# Patient Record
Sex: Female | Born: 1985 | Race: White | Hispanic: No | Marital: Single | Smoking: Former smoker
Health system: Southern US, Community
[De-identification: ages and names within clinical notes are randomized; demographics above are authoritative.]

## PROBLEM LIST (undated history)

## (undated) DIAGNOSIS — R87619 Unspecified abnormal cytological findings in specimens from cervix uteri: Secondary | ICD-10-CM

## (undated) DIAGNOSIS — IMO0002 Reserved for concepts with insufficient information to code with codable children: Secondary | ICD-10-CM

## (undated) DIAGNOSIS — Z8619 Personal history of other infectious and parasitic diseases: Secondary | ICD-10-CM

## (undated) HISTORY — DX: Unspecified abnormal cytological findings in specimens from cervix uteri: R87.619

## (undated) HISTORY — DX: Personal history of other infectious and parasitic diseases: Z86.19

## (undated) HISTORY — DX: Reserved for concepts with insufficient information to code with codable children: IMO0002

## (undated) HISTORY — PX: COLPOSCOPY: SHX161

---
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
## 2005-07-15 ENCOUNTER — Other Ambulatory Visit: Admission: RE | Admit: 2005-07-15 | Discharge: 2005-07-15 | Payer: Self-pay | Admitting: Obstetrics and Gynecology

## 2006-03-06 ENCOUNTER — Other Ambulatory Visit: Admission: RE | Admit: 2006-03-06 | Discharge: 2006-03-06 | Payer: Self-pay | Admitting: Obstetrics and Gynecology

## 2011-06-02 ENCOUNTER — Ambulatory Visit (INDEPENDENT_AMBULATORY_CARE_PROVIDER_SITE_OTHER): Payer: 59 | Admitting: Internal Medicine

## 2011-06-02 ENCOUNTER — Encounter: Payer: Self-pay | Admitting: Internal Medicine

## 2011-06-02 VITALS — BP 120/70 | HR 86 | Temp 98.7°F | Resp 16 | Ht 62.0 in | Wt 144.0 lb

## 2011-06-02 DIAGNOSIS — Z0000 Encounter for general adult medical examination without abnormal findings: Secondary | ICD-10-CM

## 2011-06-02 DIAGNOSIS — Z719 Counseling, unspecified: Secondary | ICD-10-CM | POA: Insufficient documentation

## 2011-06-02 NOTE — Patient Instructions (Signed)

## 2011-06-02 NOTE — Assessment & Plan Note (Signed)
Exam done, labs ordered, pt ed material was given 

## 2011-06-02 NOTE — Progress Notes (Signed)
  Subjective:    Patient ID: Priscilla [name], female    DOB: [birth year], 25 y.o.   MRN: [id number]  HPI New to me for a complete physical, she offers no complaints.   Review of Systems  Constitutional: Negative.   HENT: Negative.   Eyes: Negative.   Respiratory: Negative.   Cardiovascular: Negative.   Gastrointestinal: Negative.   Genitourinary: Negative.   Musculoskeletal: Negative.   Skin: Negative.   Neurological: Negative.   Hematological: Negative.   Psychiatric/Behavioral: Negative.        Objective:   Physical Exam  Vitals reviewed. Constitutional: She is oriented to person, place, and time. She appears well-developed and well-nourished. No distress.  HENT:  Head: Normocephalic and atraumatic.  Mouth/Throat: Oropharynx is clear and moist. No oropharyngeal exudate.  Eyes: Conjunctivae and EOM are normal. Pupils are equal, round, and reactive to light. Right eye exhibits no discharge. Left eye exhibits no discharge. No scleral icterus.  Neck: Normal range of motion. Neck supple. No JVD present. No tracheal deviation present. No thyromegaly present.  Cardiovascular: Normal rate, regular rhythm and intact distal pulses.  Exam reveals no gallop and no friction rub.   No murmur heard. Pulmonary/Chest: Effort normal and breath sounds normal. No stridor. No respiratory distress. She has no wheezes. She has no rales. She exhibits no tenderness.  Abdominal: Soft. Bowel sounds are normal. She exhibits no distension and no mass. There is no tenderness. There is no rebound and no guarding.  Musculoskeletal: Normal range of motion. She exhibits no edema and no tenderness.  Lymphadenopathy:    She has no cervical adenopathy.  Neurological: She is oriented to person, place, and time. She displays normal reflexes. No cranial nerve deficit. She exhibits normal muscle tone. Coordination normal.  Skin: Skin is warm and dry. No rash noted. She is not diaphoretic. No erythema. No pallor.    Psychiatric: She has a normal mood and affect. Her behavior is normal. Judgment and thought content normal.          Assessment & Plan:

## 2011-06-06 ENCOUNTER — Other Ambulatory Visit (INDEPENDENT_AMBULATORY_CARE_PROVIDER_SITE_OTHER): Payer: 59

## 2011-06-06 DIAGNOSIS — Z0000 Encounter for general adult medical examination without abnormal findings: Secondary | ICD-10-CM

## 2011-06-06 LAB — COMPREHENSIVE METABOLIC PANEL
ALT: 17 U/L (ref 0–35)
AST: 25 U/L (ref 0–37)
Alkaline Phosphatase: 70 U/L (ref 39–117)
Chloride: 108 mEq/L (ref 96–112)
Creatinine, Ser: 0.7 mg/dL (ref 0.4–1.2)
Total Bilirubin: 0.6 mg/dL (ref 0.3–1.2)

## 2011-06-06 LAB — LIPID PANEL
Cholesterol: 182 mg/dL (ref 0–200)
LDL Cholesterol: 104 mg/dL — ABNORMAL HIGH (ref 0–99)
Triglycerides: 31 mg/dL (ref 0.0–149.0)
VLDL: 6.2 mg/dL (ref 0.0–40.0)

## 2011-06-06 LAB — CBC WITH DIFFERENTIAL/PLATELET
Basophils Absolute: 0 10*3/uL (ref 0.0–0.1)
Eosinophils Absolute: 0.1 10*3/uL (ref 0.0–0.7)
Hemoglobin: 13.6 g/dL (ref 12.0–15.0)
Lymphocytes Relative: 24.2 % (ref 12.0–46.0)
MCHC: 33.6 g/dL (ref 30.0–36.0)
Monocytes Absolute: 0.4 10*3/uL (ref 0.1–1.0)
Neutro Abs: 5 10*3/uL (ref 1.4–7.7)
RDW: 13.2 % (ref 11.5–14.6)

## 2011-06-06 LAB — URINALYSIS, ROUTINE W REFLEX MICROSCOPIC
Ketones, ur: NEGATIVE
Leukocytes, UA: NEGATIVE
Specific Gravity, Urine: 1.015 (ref 1.000–1.030)
Urobilinogen, UA: 0.2 (ref 0.0–1.0)

## 2011-06-07 ENCOUNTER — Encounter: Payer: Self-pay | Admitting: Internal Medicine

## 2011-06-08 ENCOUNTER — Telehealth: Payer: Self-pay | Admitting: *Deleted

## 2011-06-08 NOTE — Telephone Encounter (Signed)
Labs look good.

## 2011-06-08 NOTE — Telephone Encounter (Signed)
Patient requesting lab results. Pt states she was to get bloodwork for [facility] Biometric screening for Nicotine usage--Jilda is checking on this as we believe this is to be done via [employer].

## 2011-06-15 NOTE — Telephone Encounter (Signed)
Patient informed. 

## 2011-12-13 ENCOUNTER — Ambulatory Visit (INDEPENDENT_AMBULATORY_CARE_PROVIDER_SITE_OTHER): Payer: 59 | Admitting: Sports Medicine

## 2011-12-13 VITALS — BP 127/88 | Ht 62.0 in | Wt 135.0 lb

## 2011-12-13 DIAGNOSIS — M222X1 Patellofemoral disorders, right knee: Secondary | ICD-10-CM | POA: Insufficient documentation

## 2011-12-13 DIAGNOSIS — M25569 Pain in unspecified knee: Secondary | ICD-10-CM

## 2011-12-13 DIAGNOSIS — M222X2 Patellofemoral disorders, left knee: Secondary | ICD-10-CM

## 2011-12-13 DIAGNOSIS — M7630 Iliotibial band syndrome, unspecified leg: Secondary | ICD-10-CM | POA: Insufficient documentation

## 2011-12-13 DIAGNOSIS — M7631 Iliotibial band syndrome, right leg: Secondary | ICD-10-CM

## 2011-12-13 NOTE — Assessment & Plan Note (Signed)
May use Tylenol as needed, avoid NSAIDs do to pregnancy. IT band stretches given. Return to clinic 6 weeks, would consider IT band injection if no better at that point.

## 2011-12-13 NOTE — Assessment & Plan Note (Signed)
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 She will work on vastus [employer], as well as other patellar mobilization activities. I will see her back in 6 weeks to see how she's doing. Again, or use Tylenol as needed, but avoid NSAIDs due to pregnancy. I would certainly encourage physical activity throughout the entirety of her pregnancy.

## 2011-12-13 NOTE — Progress Notes (Signed)
  Subjective:    Patient ID: Priscilla [name], female    DOB: 05-29-1986, 26 y.o.   MRN: [id number]  HPI Priscilla [name] comes in discuss bilateral knee pain is been present for 3 months on the left side, in 2 weeks on the right side. She localizes the pain just under the kneecaps, and notes it's worse when going upstairs and downstairs, as well as getting up out of a chair. She also notes it's worse, and described as a tightness when sitting with her knees bent for long periods of time. She also notes some pain in the right knee along the distribution of the IT band. She denies any mechanical symptoms, denies any popping, snapping, clicking, locking, or buckling.  Past medical history: None.  Currently [redacted] weeks pregnant. Past surgical history: None. Family history: Positive for diabetes, and hypertension. Social history: The patient works at the [employer], sport and exercise at [employer]. Allergies: None.  Review of Systems      No fevers, chills, night sweats, weight loss, chest pain, or shortness of breath.  Social History: Non-smoker. Objective:   Physical Exam General:  Well developed, well nourished, and in no acute distress. Neuro:  Alert and oriented x3, extra-ocular muscles intact. Skin: Warm and dry, no rashes noted. Respiratory:  Not using accessory muscles, speaking in full sentences. Musculoskeletal: Bilateral Knee: Normal to inspection with no erythema or effusion or obvious bony abnormalities. Palpation normal with no warmth, joint line tenderness, patellar tenderness, or condyle tenderness. ROM full in flexion and extension and lower leg rotation. Ligaments with solid consistent endpoints including ACL, PCL, LCL, MCL. Negative Mcmurray's, Apley's, and Thessalonian tests. Non painful patellar compression. Patellar and quadriceps tendons unremarkable. Hamstring and quadriceps strength is normal.  Patellar glide with crepitus. Positive Ober's test on the right side. Poor vastus medialis obliquus   definition bilaterally.  Good hip abductor strength bilaterally.    Assessment & Plan:

## 2012-01-24 ENCOUNTER — Ambulatory Visit (INDEPENDENT_AMBULATORY_CARE_PROVIDER_SITE_OTHER): Payer: 59 | Admitting: Sports Medicine

## 2012-01-24 VITALS — BP 118/79

## 2012-01-24 DIAGNOSIS — M2420 Disorder of ligament, unspecified site: Secondary | ICD-10-CM

## 2012-01-24 DIAGNOSIS — M25569 Pain in unspecified knee: Secondary | ICD-10-CM

## 2012-01-24 DIAGNOSIS — M629 Disorder of muscle, unspecified: Secondary | ICD-10-CM

## 2012-01-24 DIAGNOSIS — M222X1 Patellofemoral disorders, right knee: Secondary | ICD-10-CM

## 2012-01-24 DIAGNOSIS — M7630 Iliotibial band syndrome, unspecified leg: Secondary | ICD-10-CM

## 2012-01-24 NOTE — Assessment & Plan Note (Signed)
Also resolved with conservative measures, she should continue to do these on a daily basis.

## 2012-01-24 NOTE — Progress Notes (Signed)
Patient ID: Priscilla [name], female   DOB: Jun 03, 1986, 26 y.o.   MRN: [id number] Subjective:   [id number] bilateral knee pain, IT band syndrome.  HPI: and came to see [place] approximately 5-6 weeks ago with bilateral knee pain, patellofemoral, as well as right-sided IT band syndrome. I placed her on some conservative-type treatments, and asked her to come back to see [place]. 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  She notes that all symptoms are approximately 98% better.    Past medical history, surgical history, family history, social history, allergies, and medications reviewed from the medical record and no changes needed.  Review of Systems: No fevers, chills, night sweats, weight loss, chest pain, or shortness of breath.    Objective:  General:  Well Developed, well nourished, and in no acute distress. Neuro:  Alert and oriented x3, extra-ocular muscles intact. Skin: Warm and dry, no rashes noted. Respiratory:  Not using accessory muscles, speaking in full sentences. Musculoskeletal: Bilateral Knee: Normal to inspection with no erythema or effusion or obvious bony abnormalities. Palpation normal with no warmth, joint line tenderness, patellar tenderness, or condyle tenderness. ROM full in flexion and extension and lower leg rotation. Ligaments with solid consistent endpoints including ACL, PCL, LCL, MCL. Negative Mcmurray's, Apley's, and Thessalonian tests. Non painful patellar compression. Patellar glide without crepitus. Patellar and quadriceps tendons unremarkable. Hamstring and quadriceps strength is normal.   Assessment & Plan:

## 2012-01-24 NOTE — Assessment & Plan Note (Addendum)
Resolved with conservative measures. She should continue her exercises on a daily basis.

## 2012-05-17 LAB — OB RESULTS CONSOLE ABO/RH: RH Type: POSITIVE

## 2012-05-17 LAB — OB RESULTS CONSOLE HIV ANTIBODY (ROUTINE TESTING): HIV: NONREACTIVE

## 2012-05-17 LAB — OB RESULTS CONSOLE HEPATITIS B SURFACE ANTIGEN: Hepatitis B Surface Ag: NEGATIVE

## 2012-06-04 ENCOUNTER — Encounter: Payer: 59 | Admitting: Internal Medicine

## 2012-12-14 ENCOUNTER — Encounter (HOSPITAL_COMMUNITY): Payer: Self-pay | Admitting: *Deleted

## 2012-12-14 ENCOUNTER — Telehealth (HOSPITAL_COMMUNITY): Payer: Self-pay | Admitting: *Deleted

## 2012-12-14 LAB — OB RESULTS CONSOLE GBS: GBS: NEGATIVE

## 2012-12-14 NOTE — Telephone Encounter (Signed)
Preadmission screen  

## 2012-12-20 ENCOUNTER — Encounter (HOSPITAL_COMMUNITY): Payer: Self-pay | Admitting: *Deleted

## 2012-12-20 ENCOUNTER — Inpatient Hospital Stay (HOSPITAL_COMMUNITY)
Admission: AD | Admit: 2012-12-20 | Discharge: 2012-12-24 | DRG: 766 | Disposition: A | Discharge status: Home / Self Care | Payer: 59 | Source: Ambulatory Visit | Attending: Obstetrics and Gynecology | Admitting: Obstetrics and Gynecology

## 2012-12-20 DIAGNOSIS — O4100X0 Oligohydramnios, unspecified trimester, not applicable or unspecified: Principal | ICD-10-CM | POA: Diagnosis present

## 2012-12-20 DIAGNOSIS — Z98891 History of uterine scar from previous surgery: Secondary | ICD-10-CM

## 2012-12-20 DIAGNOSIS — O480 Post-term pregnancy: Secondary | ICD-10-CM | POA: Diagnosis present

## 2012-12-20 MED ORDER — BUTORPHANOL TARTRATE 1 MG/ML IJ SOLN
1.0000 mg | INTRAMUSCULAR | Status: DC | PRN
  Administered 2012-12-21: 1 mg via INTRAVENOUS
  Filled 2012-12-20: qty 1

## 2012-12-20 MED ORDER — TERBUTALINE SULFATE 1 MG/ML IJ SOLN: 0.2500 mg | Freq: Once | INTRAMUSCULAR | Status: AC | PRN

## 2012-12-20 MED ORDER — OXYTOCIN BOLUS FROM INFUSION: 500.0000 mL | INTRAVENOUS | Status: DC

## 2012-12-20 MED ORDER — MISOPROSTOL 25 MCG QUARTER TABLET
25.0000 ug | ORAL_TABLET | ORAL | Status: DC | PRN
  Administered 2012-12-20: 25 ug via VAGINAL
  Filled 2012-12-20: qty 0.25

## 2012-12-20 MED ORDER — IBUPROFEN 600 MG PO TABS: 600.0000 mg | ORAL_TABLET | Freq: Four times a day (QID) | ORAL | Status: DC | PRN

## 2012-12-20 MED ORDER — FLEET ENEMA 7-19 GM/118ML RE ENEM: 1.0000 | ENEMA | RECTAL | Status: DC | PRN

## 2012-12-20 MED ORDER — CITRIC ACID-SODIUM CITRATE 334-500 MG/5ML PO SOLN
30.0000 mL | ORAL | Status: DC | PRN
  Administered 2012-12-22: 30 mL via ORAL
  Filled 2012-12-20: qty 15

## 2012-12-20 MED ORDER — OXYCODONE-ACETAMINOPHEN 5-325 MG PO TABS: 1.0000 | ORAL_TABLET | ORAL | Status: DC | PRN

## 2012-12-20 MED ORDER — ZOLPIDEM TARTRATE 5 MG PO TABS: 5.0000 mg | ORAL_TABLET | Freq: Every evening | ORAL | Status: DC | PRN

## 2012-12-20 MED ORDER — OXYTOCIN 40 UNITS IN LACTATED RINGERS INFUSION - SIMPLE MED
62.5000 mL/h | INTRAVENOUS | Status: DC
  Filled 2012-12-20: qty 1000

## 2012-12-20 MED ORDER — LACTATED RINGERS IV SOLN
500.0000 mL | INTRAVENOUS | Status: DC | PRN
  Administered 2012-12-20: 500 mL via INTRAVENOUS

## 2012-12-20 MED ORDER — LIDOCAINE HCL (PF) 1 % IJ SOLN: 30.0000 mL | INTRAMUSCULAR | Status: DC | PRN

## 2012-12-20 MED ORDER — LACTATED RINGERS IV SOLN
INTRAVENOUS | Status: DC
  Administered 2012-12-21 (×2): via INTRAVENOUS

## 2012-12-20 MED ORDER — ACETAMINOPHEN 325 MG PO TABS: 650.0000 mg | ORAL_TABLET | ORAL | Status: DC | PRN

## 2012-12-20 MED ORDER — ONDANSETRON HCL 4 MG/2ML IJ SOLN: 4.0000 mg | Freq: Four times a day (QID) | INTRAMUSCULAR | Status: DC | PRN

## 2012-12-20 NOTE — H&P (Signed)
Priscilla [name] is a 27 y.o. female presenting for induction of labor due to oligohydraminos and postdates.  US shows Vertex with EFW 3669gms (8+1#), AFI of 7.0.  BPP 8/8.  Pregnancy otherwise uncomplicated.  GBS-. History OB History   Grav Para Term Preterm Abortions TAB SAB Ect Mult Living   2 0   1  1        Past Medical History  Diagnosis Date  . Abnormal Pap smear   . Hx of varicella    Past Surgical History  Procedure Laterality Date  . Colposcopy     Family History: family history includes Cancer in her maternal grandmother; Diabetes in her father and mother; Hyperlipidemia in her father; and Hypertension in her father. Social History:  reports that she quit smoking about 4 years ago. She does not have any smokeless tobacco history on file. She reports that she drinks about 0.6 ounces of alcohol per week. She reports that she does not use illicit drugs.   Prenatal Transfer Tool  Maternal Diabetes: No Genetic Screening: Normal Maternal Ultrasounds/Referrals:  Fetal Ultrasounds or other Referrals:  Other: oligo as above Maternal Substance Abuse:  No Significant Maternal Medications:  None Significant Maternal Lab Results:  None Other Comments:  None  ROS    There were no vitals taken for this visit. Exam Physical Exam  Prenatal labs: ABO, Rh: O/Positive/-- (10/17 0000) Antibody: Negative (10/17 0000) Rubella: Immune (10/17 0000) RPR: Nonreactive (10/17 0000)  HBsAg: Negative (10/17 0000)  HIV: Non-reactive (10/17 0000)  GBS: Negative (05/16 0000)   Assessment/Plan: IUP at 40+ weeks with oligohydraminos Plan Induction of labor with cytotec riponing   Vernell Back C 12/20/2012, 7:00 PM

## 2012-12-21 LAB — RPR: RPR Ser Ql: NONREACTIVE

## 2012-12-21 MED ORDER — DIPHENHYDRAMINE HCL 50 MG/ML IJ SOLN: 12.5000 mg | INTRAMUSCULAR | Status: DC | PRN

## 2012-12-21 MED ORDER — PHENYLEPHRINE 40 MCG/ML (10ML) SYRINGE FOR IV PUSH (FOR BLOOD PRESSURE SUPPORT): 80.0000 ug | PREFILLED_SYRINGE | INTRAVENOUS | Status: DC | PRN

## 2012-12-21 MED ORDER — EPHEDRINE 5 MG/ML INJ
10.0000 mg | INTRAVENOUS | Status: DC | PRN
  Filled 2012-12-21: qty 4

## 2012-12-21 MED ORDER — TERBUTALINE SULFATE 1 MG/ML IJ SOLN: 0.2500 mg | Freq: Once | INTRAMUSCULAR | Status: AC | PRN

## 2012-12-21 MED ORDER — LIDOCAINE HCL (PF) 1 % IJ SOLN
INTRAMUSCULAR | Status: DC | PRN
  Administered 2012-12-21 (×2): 5 mL

## 2012-12-21 MED ORDER — FENTANYL 2.5 MCG/ML BUPIVACAINE 1/10 % EPIDURAL INFUSION (WH - ANES)
14.0000 mL/h | INTRAMUSCULAR | Status: DC | PRN
  Administered 2012-12-21 (×3): 14 mL/h via EPIDURAL
  Filled 2012-12-21 (×3): qty 125

## 2012-12-21 MED ORDER — EPHEDRINE 5 MG/ML INJ: 10.0000 mg | INTRAVENOUS | Status: DC | PRN

## 2012-12-21 MED ORDER — PHENYLEPHRINE 40 MCG/ML (10ML) SYRINGE FOR IV PUSH (FOR BLOOD PRESSURE SUPPORT)
80.0000 ug | PREFILLED_SYRINGE | INTRAVENOUS | Status: DC | PRN
  Filled 2012-12-21: qty 5

## 2012-12-21 MED ORDER — LACTATED RINGERS IV SOLN: 500.0000 mL | Freq: Once | INTRAVENOUS | Status: DC

## 2012-12-21 MED ORDER — OXYTOCIN 40 UNITS IN LACTATED RINGERS INFUSION - SIMPLE MED
1.0000 m[IU]/min | INTRAVENOUS | Status: DC
  Administered 2012-12-21: 2 m[IU]/min via INTRAVENOUS
  Administered 2012-12-21: 4 m[IU]/min via INTRAVENOUS

## 2012-12-21 NOTE — Progress Notes (Signed)
Patient ID: Priscilla [name], female   DOB: 08/18/1985, 27 y.o.   MRN: [id number] Earlier fhr with late decels  pitocin discontinued IUPC and scalp lead placed Now fhr cat 1 but poor ctx pattern Cervix still 4 cm Will restart pitocin

## 2012-12-21 NOTE — Anesthesia Procedure Notes (Signed)
Epidural Patient location during procedure: OB Start time: 12/21/2012 9:32 AM  Staffing Anesthesiologist: Angus Seller., Harrell Gave. Performed by: anesthesiologist   Preanesthetic Checklist Completed: patient identified, site marked, surgical consent, pre-op evaluation, timeout performed, IV checked, risks and benefits discussed and monitors and equipment checked  Epidural Patient position: sitting Prep: site prepped and draped and DuraPrep Patient monitoring: continuous pulse ox and blood pressure Approach: midline Injection technique: LOR air and LOR saline  Needle:  Needle type: Tuohy  Needle gauge: 17 G Needle length: 9 cm and 9 Needle insertion depth: 5 cm cm Catheter type: closed end flexible Catheter size: 19 Gauge Catheter at skin depth: 10 cm Test dose: negative  Assessment Events: blood not aspirated, injection not painful, no injection resistance, negative IV test and no paresthesia  Additional Notes Patient identified.  Risk benefits discussed including failed block, incomplete pain control, headache, nerve damage, paralysis, blood pressure changes, nausea, vomiting, reactions to medication both toxic or allergic, and postpartum back pain.  Patient expressed understanding and wished to proceed.  All questions were answered.  Sterile technique used throughout procedure and epidural site dressed with sterile barrier dressing. No paresthesia or other complications noted.The patient did not experience any signs of intravascular injection such as tinnitus or metallic taste in mouth nor signs of intrathecal spread such as rapid motor block. Please see nursing notes for vital signs.

## 2012-12-21 NOTE — Anesthesia Preprocedure Evaluation (Addendum)
Anesthesia Evaluation  Patient identified by MRN, date of birth, ID band Patient awake    Reviewed: Allergy & Precautions, H&P , NPO status , Patient's Chart, lab work & pertinent test results  Airway Mallampati: II TM Distance: >3 FB Neck ROM: full    Dental  (+) Teeth Intact   Pulmonary neg pulmonary ROS,  breath sounds clear to auscultation        Cardiovascular negative cardio ROS  Rhythm:regular Rate:Normal     Neuro/Psych negative neurological ROS  negative psych ROS   GI/Hepatic negative GI ROS, Neg liver ROS,   Endo/Other  negative endocrine ROS  Renal/GU negative Renal ROS     Musculoskeletal negative musculoskeletal ROS (+)   Abdominal   Peds  Hematology negative hematology ROS (+)   Anesthesia Other Findings       Reproductive/Obstetrics (+) Pregnancy                          Anesthesia Physical Anesthesia Plan  ASA: II  Anesthesia Plan: Epidural   Post-op Pain Management:    Induction:   Airway Management Planned:   Additional Equipment:   Intra-op Plan:   Post-operative Plan:   Informed Consent: I have reviewed the patients History and Physical, chart, labs and discussed the procedure including the risks, benefits and alternatives for the proposed anesthesia with the patient or authorized representative who has indicated his/her understanding and acceptance.   Dental Advisory Given  Plan Discussed with: CRNA  Anesthesia Plan Comments: (Labs checked- platelets confirmed with RN in room. Fetal heart tracing, per RN, reported to be stable enough for sitting procedure. Discussed epidural, and patient consents to the procedure:  included risk of possible headache,backache, failed block, allergic reaction, and nerve injury. This patient was asked if she had any questions or concerns before the procedure started. )       Anesthesia Quick Evaluation

## 2012-12-21 NOTE — Progress Notes (Signed)
Patient ID: Priscilla [name], female   DOB: 1985-08-02, 27 y.o.   MRN: [id number] Cervix 2 cm 50 % vts -1 arom clear fhr category 1 Pitocin already started risks discussed

## 2012-12-22 ENCOUNTER — Encounter (HOSPITAL_COMMUNITY): Payer: Self-pay | Admitting: Anesthesiology

## 2012-12-22 ENCOUNTER — Encounter (HOSPITAL_COMMUNITY): Payer: Self-pay | Admitting: *Deleted

## 2012-12-22 ENCOUNTER — Inpatient Hospital Stay (HOSPITAL_COMMUNITY): Payer: 59 | Admitting: Anesthesiology

## 2012-12-22 ENCOUNTER — Encounter (HOSPITAL_COMMUNITY)
Admission: AD | Disposition: A | Discharge status: Home / Self Care | Payer: Self-pay | Source: Ambulatory Visit | Attending: Obstetrics and Gynecology

## 2012-12-22 DIAGNOSIS — Z98891 History of uterine scar from previous surgery: Secondary | ICD-10-CM

## 2012-12-22 LAB — CBC
HCT: 35 % — ABNORMAL LOW (ref 36.0–46.0)
MCH: 29.9 pg (ref 26.0–34.0)
MCV: 89.5 fL (ref 78.0–100.0)
MCV: 89.5 fL (ref 78.0–100.0)
Platelets: 217 10*3/uL (ref 150–400)
RBC: 4.23 MIL/uL (ref 3.87–5.11)
RBC: 3.91 MIL/uL (ref 3.87–5.11)
RDW: 13.8 % (ref 11.5–15.5)
WBC: 15.1 10*3/uL — ABNORMAL HIGH (ref 4.0–10.5)
WBC: 31.8 10*3/uL — ABNORMAL HIGH (ref 4.0–10.5)

## 2012-12-22 SURGERY — Surgical Case
Anesthesia: Epidural

## 2012-12-22 MED ORDER — DIBUCAINE 1 % RE OINT: 1.0000 "application " | TOPICAL_OINTMENT | RECTAL | Status: DC | PRN

## 2012-12-22 MED ORDER — OXYTOCIN 10 UNIT/ML IJ SOLN
INTRAMUSCULAR | Status: AC
  Filled 2012-12-22: qty 4

## 2012-12-22 MED ORDER — MEPERIDINE HCL 25 MG/ML IJ SOLN: 6.2500 mg | INTRAMUSCULAR | Status: DC | PRN

## 2012-12-22 MED ORDER — PRENATAL MULTIVITAMIN CH
1.0000 | ORAL_TABLET | Freq: Every day | ORAL | Status: DC
  Administered 2012-12-22 – 2012-12-24 (×3): 1 via ORAL
  Filled 2012-12-22 (×3): qty 1

## 2012-12-22 MED ORDER — LACTATED RINGERS IV SOLN
INTRAVENOUS | Status: DC | PRN
  Administered 2012-12-22: 01:00:00 via INTRAVENOUS

## 2012-12-22 MED ORDER — ZOLPIDEM TARTRATE 5 MG PO TABS: 5.0000 mg | ORAL_TABLET | Freq: Every evening | ORAL | Status: DC | PRN

## 2012-12-22 MED ORDER — DIPHENHYDRAMINE HCL 50 MG/ML IJ SOLN: 25.0000 mg | INTRAMUSCULAR | Status: DC | PRN

## 2012-12-22 MED ORDER — SIMETHICONE 80 MG PO CHEW: 80.0000 mg | CHEWABLE_TABLET | ORAL | Status: DC | PRN

## 2012-12-22 MED ORDER — WITCH HAZEL-GLYCERIN EX PADS: 1.0000 "application " | MEDICATED_PAD | CUTANEOUS | Status: DC | PRN

## 2012-12-22 MED ORDER — DIPHENHYDRAMINE HCL 25 MG PO CAPS: 25.0000 mg | ORAL_CAPSULE | Freq: Four times a day (QID) | ORAL | Status: DC | PRN

## 2012-12-22 MED ORDER — OXYTOCIN 10 UNIT/ML IJ SOLN
40.0000 [IU] | INTRAVENOUS | Status: DC | PRN
  Administered 2012-12-22: 40 [IU] via INTRAVENOUS

## 2012-12-22 MED ORDER — MEPERIDINE HCL 25 MG/ML IJ SOLN
INTRAMUSCULAR | Status: AC
  Filled 2012-12-22: qty 1

## 2012-12-22 MED ORDER — PHENYLEPHRINE 40 MCG/ML (10ML) SYRINGE FOR IV PUSH (FOR BLOOD PRESSURE SUPPORT)
PREFILLED_SYRINGE | INTRAVENOUS | Status: AC
  Filled 2012-12-22: qty 5

## 2012-12-22 MED ORDER — ONDANSETRON HCL 4 MG/2ML IJ SOLN: 4.0000 mg | Freq: Three times a day (TID) | INTRAMUSCULAR | Status: DC | PRN

## 2012-12-22 MED ORDER — MENTHOL 3 MG MT LOZG: 1.0000 | LOZENGE | OROMUCOSAL | Status: DC | PRN

## 2012-12-22 MED ORDER — ONDANSETRON HCL 4 MG/2ML IJ SOLN: 4.0000 mg | INTRAMUSCULAR | Status: DC | PRN

## 2012-12-22 MED ORDER — FLEET ENEMA 7-19 GM/118ML RE ENEM: 1.0000 | ENEMA | Freq: Every day | RECTAL | Status: DC | PRN

## 2012-12-22 MED ORDER — OXYCODONE-ACETAMINOPHEN 5-325 MG PO TABS
1.0000 | ORAL_TABLET | ORAL | Status: DC | PRN
Start: 2012-12-22 — End: 2012-12-24
  Administered 2012-12-22 – 2012-12-24 (×6): 1 via ORAL
  Filled 2012-12-22 (×7): qty 1

## 2012-12-22 MED ORDER — BUPIVACAINE HCL 0.25 % IJ SOLN
INTRAMUSCULAR | Status: DC | PRN
  Administered 2012-12-22: 8 mL

## 2012-12-22 MED ORDER — SODIUM CHLORIDE 0.9 % IJ SOLN: 3.0000 mL | INTRAMUSCULAR | Status: DC | PRN

## 2012-12-22 MED ORDER — KETOROLAC TROMETHAMINE 60 MG/2ML IM SOLN
INTRAMUSCULAR | Status: AC
  Filled 2012-12-22: qty 2

## 2012-12-22 MED ORDER — SODIUM CHLORIDE 0.9 % IJ SOLN: 3.0000 mL | Freq: Two times a day (BID) | INTRAMUSCULAR | Status: DC

## 2012-12-22 MED ORDER — KETOROLAC TROMETHAMINE 30 MG/ML IJ SOLN: 30.0000 mg | Freq: Four times a day (QID) | INTRAMUSCULAR | Status: AC | PRN

## 2012-12-22 MED ORDER — KETOROLAC TROMETHAMINE 30 MG/ML IJ SOLN
30.0000 mg | Freq: Four times a day (QID) | INTRAMUSCULAR | Status: AC | PRN
  Administered 2012-12-22: 30 mg via INTRAVENOUS
  Filled 2012-12-22: qty 1

## 2012-12-22 MED ORDER — TETANUS-DIPHTH-ACELL PERTUSSIS 5-2.5-18.5 LF-MCG/0.5 IM SUSP: 0.5000 mL | Freq: Once | INTRAMUSCULAR | Status: DC

## 2012-12-22 MED ORDER — DIPHENHYDRAMINE HCL 25 MG PO CAPS: 25.0000 mg | ORAL_CAPSULE | ORAL | Status: DC | PRN

## 2012-12-22 MED ORDER — PHENYLEPHRINE HCL 10 MG/ML IJ SOLN
INTRAMUSCULAR | Status: DC | PRN
  Administered 2012-12-22 (×2): 80 ug via INTRAVENOUS

## 2012-12-22 MED ORDER — ONDANSETRON HCL 4 MG PO TABS: 4.0000 mg | ORAL_TABLET | ORAL | Status: DC | PRN

## 2012-12-22 MED ORDER — MORPHINE SULFATE (PF) 0.5 MG/ML IJ SOLN
INTRAMUSCULAR | Status: DC | PRN
  Administered 2012-12-22: 1 mg via INTRAVENOUS

## 2012-12-22 MED ORDER — LACTATED RINGERS IV SOLN
INTRAVENOUS | Status: DC
  Administered 2012-12-22: 12:00:00 via INTRAVENOUS

## 2012-12-22 MED ORDER — IBUPROFEN 600 MG PO TABS
600.0000 mg | ORAL_TABLET | Freq: Four times a day (QID) | ORAL | Status: DC
  Administered 2012-12-22 – 2012-12-24 (×8): 600 mg via ORAL
  Filled 2012-12-22 (×9): qty 1

## 2012-12-22 MED ORDER — CEFAZOLIN SODIUM-DEXTROSE 2-3 GM-% IV SOLR
INTRAVENOUS | Status: AC
  Filled 2012-12-22: qty 50

## 2012-12-22 MED ORDER — NALBUPHINE HCL 10 MG/ML IJ SOLN
5.0000 mg | INTRAMUSCULAR | Status: DC | PRN
  Filled 2012-12-22: qty 1

## 2012-12-22 MED ORDER — SCOPOLAMINE 1 MG/3DAYS TD PT72
MEDICATED_PATCH | TRANSDERMAL | Status: AC
  Filled 2012-12-22: qty 1

## 2012-12-22 MED ORDER — MORPHINE SULFATE 0.5 MG/ML IJ SOLN
INTRAMUSCULAR | Status: AC
  Filled 2012-12-22: qty 10

## 2012-12-22 MED ORDER — KETOROLAC TROMETHAMINE 60 MG/2ML IM SOLN
60.0000 mg | Freq: Once | INTRAMUSCULAR | Status: AC | PRN
  Administered 2012-12-22: 60 mg via INTRAMUSCULAR

## 2012-12-22 MED ORDER — BISACODYL 10 MG RE SUPP: 10.0000 mg | Freq: Every day | RECTAL | Status: DC | PRN

## 2012-12-22 MED ORDER — ONDANSETRON HCL 4 MG/2ML IJ SOLN
INTRAMUSCULAR | Status: AC
  Filled 2012-12-22: qty 2

## 2012-12-22 MED ORDER — SCOPOLAMINE 1 MG/3DAYS TD PT72
1.0000 | MEDICATED_PATCH | Freq: Once | TRANSDERMAL | Status: DC
  Administered 2012-12-22: 1.5 mg via TRANSDERMAL

## 2012-12-22 MED ORDER — OXYTOCIN 40 UNITS IN LACTATED RINGERS INFUSION - SIMPLE MED: 62.5000 mL/h | INTRAVENOUS | Status: AC

## 2012-12-22 MED ORDER — PROMETHAZINE HCL 25 MG/ML IJ SOLN: 6.2500 mg | INTRAMUSCULAR | Status: DC | PRN

## 2012-12-22 MED ORDER — NALOXONE HCL 0.4 MG/ML IJ SOLN: 0.4000 mg | INTRAMUSCULAR | Status: DC | PRN

## 2012-12-22 MED ORDER — METOCLOPRAMIDE HCL 5 MG/ML IJ SOLN: 10.0000 mg | Freq: Three times a day (TID) | INTRAMUSCULAR | Status: DC | PRN

## 2012-12-22 MED ORDER — ACETAMINOPHEN 10 MG/ML IV SOLN: 1000.0000 mg | Freq: Once | INTRAVENOUS | Status: DC | PRN

## 2012-12-22 MED ORDER — LANOLIN HYDROUS EX OINT: 1.0000 "application " | TOPICAL_OINTMENT | CUTANEOUS | Status: DC | PRN

## 2012-12-22 MED ORDER — DIPHENHYDRAMINE HCL 50 MG/ML IJ SOLN: 12.5000 mg | INTRAMUSCULAR | Status: DC | PRN

## 2012-12-22 MED ORDER — SENNOSIDES-DOCUSATE SODIUM 8.6-50 MG PO TABS
2.0000 | ORAL_TABLET | Freq: Every day | ORAL | Status: DC
  Administered 2012-12-22 – 2012-12-23 (×2): 2 via ORAL

## 2012-12-22 MED ORDER — HYDROMORPHONE HCL PF 1 MG/ML IJ SOLN: 0.2500 mg | INTRAMUSCULAR | Status: DC | PRN

## 2012-12-22 MED ORDER — MEPERIDINE HCL 25 MG/ML IJ SOLN
INTRAMUSCULAR | Status: DC | PRN
  Administered 2012-12-22 (×2): 12.5 mg via INTRAVENOUS

## 2012-12-22 MED ORDER — SODIUM CHLORIDE 0.9 % IV SOLN: 250.0000 mL | INTRAVENOUS | Status: DC

## 2012-12-22 MED ORDER — MORPHINE SULFATE (PF) 0.5 MG/ML IJ SOLN
INTRAMUSCULAR | Status: DC | PRN
  Administered 2012-12-22: 4 mg via EPIDURAL

## 2012-12-22 MED ORDER — LIDOCAINE-EPINEPHRINE (PF) 2 %-1:200000 IJ SOLN
INTRAMUSCULAR | Status: DC | PRN
  Administered 2012-12-22: 8 mL via EPIDURAL
  Administered 2012-12-22: 7 mL via EPIDURAL

## 2012-12-22 MED ORDER — SIMETHICONE 80 MG PO CHEW
80.0000 mg | CHEWABLE_TABLET | Freq: Three times a day (TID) | ORAL | Status: DC
  Administered 2012-12-22 – 2012-12-24 (×8): 80 mg via ORAL

## 2012-12-22 MED ORDER — BUPIVACAINE HCL (PF) 0.25 % IJ SOLN
INTRAMUSCULAR | Status: AC
  Filled 2012-12-22: qty 30

## 2012-12-22 MED ORDER — NALOXONE HCL 1 MG/ML IJ SOLN
1.0000 ug/kg/h | INTRAVENOUS | Status: DC | PRN
  Filled 2012-12-22: qty 2

## 2012-12-22 SURGICAL SUPPLY — 30 items
ADH SKN CLS LQ APL DERMABOND (GAUZE/BANDAGES/DRESSINGS) ×1
CLAMP CORD UMBIL (MISCELLANEOUS) IMPLANT
CLOTH BEACON ORANGE TIMEOUT ST (SAFETY) ×2 IMPLANT
DERMABOND ADHESIVE PROPEN (GAUZE/BANDAGES/DRESSINGS) ×1
DERMABOND ADVANCED .7 DNX6 (GAUZE/BANDAGES/DRESSINGS) ×1 IMPLANT
DRAPE LG THREE QUARTER DISP (DRAPES) ×2 IMPLANT
DRSG OPSITE POSTOP 4X10 (GAUZE/BANDAGES/DRESSINGS) ×2 IMPLANT
DURAPREP 26ML APPLICATOR (WOUND CARE) ×2 IMPLANT
ELECT REM PT RETURN 9FT ADLT (ELECTROSURGICAL) ×2
ELECTRODE REM PT RTRN 9FT ADLT (ELECTROSURGICAL) ×1 IMPLANT
EXTRACTOR VACUUM M CUP 4 TUBE (SUCTIONS) IMPLANT
GLOVE BIO SURGEON STRL SZ7 (GLOVE) ×2 IMPLANT
GOWN STRL REIN XL XLG (GOWN DISPOSABLE) ×4 IMPLANT
KIT ABG SYR 3ML LUER SLIP (SYRINGE) ×2 IMPLANT
NDL HYPO 25X5/8 SAFETYGLIDE (NEEDLE) ×1 IMPLANT
NEEDLE HYPO 25X5/8 SAFETYGLIDE (NEEDLE) ×2 IMPLANT
NS IRRIG 1000ML POUR BTL (IV SOLUTION) ×2 IMPLANT
PACK C SECTION WH (CUSTOM PROCEDURE TRAY) ×2 IMPLANT
PAD OB MATERNITY 4.3X12.25 (PERSONAL CARE ITEMS) ×2 IMPLANT
STAPLER VISISTAT 35W (STAPLE) IMPLANT
SUT CHROMIC 0 CTX 36 (SUTURE) ×4 IMPLANT
SUT MON AB 4-0 PS1 27 (SUTURE) ×2 IMPLANT
SUT PDS AB 0 CT 36 (SUTURE) ×2 IMPLANT
SUT PLAIN 0 NONE (SUTURE) IMPLANT
SUT PLAIN 2 0 XLH (SUTURE) IMPLANT
SUT VIC AB 3-0 CT1 27 (SUTURE) ×2
SUT VIC AB 3-0 CT1 TAPERPNT 27 (SUTURE) ×1 IMPLANT
TOWEL OR 17X24 6PK STRL BLUE (TOWEL DISPOSABLE) ×6 IMPLANT
TRAY FOLEY CATH 14FR (SET/KITS/TRAYS/PACK) ×2 IMPLANT
WATER STERILE IRR 1000ML POUR (IV SOLUTION) ×2 IMPLANT

## 2012-12-22 NOTE — Brief Op Note (Signed)
12/20/2012 - 12/22/2012  1:23 AM  PATIENT:  Raechel Chute  27 y.o. female  PRE-OPERATIVE DIAGNOSIS:  Failure To Progress  POST-OPERATIVE DIAGNOSIS:  Failure To Progress, Meconium Stained Fluid  PROCEDURE:  Procedure(s): PRIMARY CESAREAN SECTION (N/A)  SURGEON:  Surgeon(s) and Role:    * Juluis Mire, MD - Primary  PHYSICIAN ASSISTANT:   ASSISTANTS: none   ANESTHESIA:   local and epidural  EBL:  Total I/O In: 800 [I.V.:800] Out: 1350 [Urine:950; Blood:400]  BLOOD ADMINISTERED:none  DRAINS: Urinary Catheter (Foley)   LOCAL MEDICATIONS USED:  MARCAINE     SPECIMEN:  Source of Specimen:  placent  DISPOSITION OF SPECIMEN:  PATHOLOGY  COUNTS:  YES  TOURNIQUET:  * No tourniquets in log *  DICTATION: .Other Dictation: Dictation Number [id number]  PLAN OF CARE: Admit to inpatient   PATIENT DISPOSITION:  PACU - hemodynamically stable.   Delay start of Pharmacological VTE agent (>24hrs) due to surgical blood loss or risk of bleeding: no

## 2012-12-22 NOTE — Brief Op Note (Signed)
12/20/2012 - 12/22/2012  1:27 AM  PATIENT:  Raechel Chute  27 y.o. female  PRE-OPERATIVE DIAGNOSIS:  Failure To Progress  POST-OPERATIVE DIAGNOSIS:  Failure To Progress, Meconium Stained Fluid  PROCEDURE:  Procedure(s): PRIMARY CESAREAN SECTION (N/A)  SURGEON:  Surgeon(s) and Role:    * Juluis Mire, MD - Primary  PHYSICIAN ASSISTANT:   ASSISTANTS: none   ANESTHESIA:   none  EBL:  Total I/O In: 800 [I.V.:800] Out: 1350 [Urine:950; Blood:400]  BLOOD ADMINISTERED:none  DRAINS: Urinary Catheter (Foley)   LOCAL MEDICATIONS USED:  MARCAINE     SPECIMEN:  Source of Specimen:  placent  DISPOSITION OF SPECIMEN:  PATHOLOGY  COUNTS:  YES  TOURNIQUET:  * No tourniquets in log *  DICTATION: .Other Dictation: Dictation Number [id number]  PLAN OF CARE: Admit to inpatient   PATIENT DISPOSITION:  PACU - hemodynamically stable.   Delay start of Pharmacological VTE agent (>24hrs) due to surgical blood loss or risk of bleeding: no

## 2012-12-22 NOTE — Anesthesia Postprocedure Evaluation (Signed)
Anesthesia Post Note  Patient: Priscilla [name]  Procedure(s) Performed: Procedure(s) (LRB): PRIMARY CESAREAN SECTION (N/A)  Anesthesia type: Epidural  Patient location: PACU  Post pain: Pain level controlled  Post assessment: Post-op Vital signs reviewed  Last Vitals: BP 104/85  Pulse 97  Temp(Src) 37.4 C (Oral)  Resp 23  Ht 5\' 2"  (1.575 m)  Wt 178 lb (80.74 kg)  BMI 32.55 kg/m2  SpO2 100%  Post vital signs: Reviewed  Level of consciousness: sedated  Complications: No apparent anesthesia complications

## 2012-12-22 NOTE — Anesthesia Postprocedure Evaluation (Signed)
Anesthesia Post Note  Patient: Priscilla [name]  Procedure(s) Performed: Procedure(s) (LRB): PRIMARY CESAREAN SECTION (N/A)  Anesthesia type: Epidural  Patient location: Mother/Baby  Post pain: Pain level controlled  Post assessment: Post-op Vital signs reviewed  Last Vitals:  Filed Vitals:   12/22/12 0802  BP: 106/72  Pulse: 82  Temp:   Resp:     Post vital signs: Reviewed  Level of consciousness: awake  Complications: No apparent anesthesia complications

## 2012-12-22 NOTE — Progress Notes (Signed)
Patient ID: Priscilla [name], female   DOB: 26-Feb-1986, 27 y.o.   MRN: [id number] fhr with recurrant late decel  Cervix arrested at 8 cm with increased edema procede with cesarean section Risk of cesarean section discussed.  These include:  Risk of infection;  Risk of hemorrhage that could require transfusions with the associated risk of aids or hepatitis;  Excessive bleeding could require hysterectomy;  Risk of injury to adjacent organs including bladder, bowel or ureters;  Risk of DVT's and possible pulmonary embolus.  Patient expresses a understanding of indications and risks.;

## 2012-12-22 NOTE — Op Note (Signed)
Patient name Priscilla [name], Priscilla [name] DICTATION#  [id number] CSN# [id number]  Juluis Mire, MD 12/22/2012 1:24 AM

## 2012-12-22 NOTE — Progress Notes (Signed)
Subjective:  Postpartum Day zero: Cesarean Delivery Patient reports tolerating PO.    Objective: Vital signs in last 24 hours: Temp:  [97.5 F (36.4 C)-99.7 F (37.6 C)] 98.2 F (36.8 C) (05/24 0800) Pulse Rate:  [25-105] 82 (05/24 0802) Resp:  [18-23] 18 (05/24 0800) BP: (104-137)/(60-90) 106/72 mmHg (05/24 0802) SpO2:  [95 %-100 %] 98 % (05/24 0800)  Physical Exam:  General: alert Lochia: appropriate Uterine Fundus: firm Incision: healing well DVT Evaluation: No evidence of DVT seen on physical exam.   Recent Labs  12/20/12 2025 12/22/12 0603  HGB 13.0 11.7*  HCT 38.2 35.0*    Assessment/Plan: Status post Cesarean section. Doing well postoperatively.  Continue current care.  Priscilla [name] S 12/22/2012, 9:11 AM

## 2012-12-22 NOTE — Transfer of Care (Signed)
Immediate Anesthesia Transfer of Care Note  Patient: Priscilla [name]  Procedure(s) Performed: Procedure(s): PRIMARY CESAREAN SECTION (N/A)  Patient Location: PACU  Anesthesia Type:Epidural  Level of Consciousness: awake, alert  and oriented  Airway & Oxygen Therapy: Patient Spontanous Breathing  Post-op Assessment: Report given to PACU RN and Post -op Vital signs reviewed and stable  Post vital signs: Reviewed and stable  Complications: No apparent anesthesia complications

## 2012-12-22 NOTE — Progress Notes (Signed)
Dr Arelia Sneddon at bedside discussing risks and benefits of primary c section, pt verbalizes and understands, to proceed with primary c section.

## 2012-12-23 NOTE — Progress Notes (Signed)
Subjective: Postpartum Day one: Cesarean Delivery Patient reports incisional pain and no problems voiding.    Objective: Vital signs in last 24 hours: Temp:  [97.5 F (36.4 C)-98.1 F (36.7 C)] 98.1 F (36.7 C) (05/25 0507) Pulse Rate:  [75-103] 83 (05/25 0507) Resp:  [16-20] 16 (05/25 0507) BP: (99-118)/(49-71) 118/62 mmHg (05/25 0507) SpO2:  [96 %-100 %] 99 % (05/25 0507)  Physical Exam:  General: alert Lochia: appropriate Uterine Fundus: firm Incision: healing well DVT Evaluation: No evidence of DVT seen on physical exam.   Recent Labs  12/20/12 2025 12/22/12 0603  HGB 13.0 11.7*  HCT 38.2 35.0*    Assessment/Plan: Status post Cesarean section. Doing well postoperatively.  Continue current care.  Jajaira Ruis S 12/23/2012, 9:05 AM

## 2012-12-24 ENCOUNTER — Encounter (HOSPITAL_COMMUNITY): Payer: Self-pay | Admitting: Obstetrics and Gynecology

## 2012-12-24 MED ORDER — IBUPROFEN 600 MG PO TABS: 600.0000 mg | ORAL_TABLET | Freq: Four times a day (QID) | ORAL | Status: DC

## 2012-12-24 MED ORDER — OXYCODONE-ACETAMINOPHEN 5-325 MG PO TABS: 1.0000 | ORAL_TABLET | ORAL | Status: DC | PRN

## 2012-12-24 NOTE — Discharge Summary (Signed)
Obstetric Discharge Summary Reason for Admission: induction of labor Prenatal Procedures: none Intrapartum Procedures: cesarean: low cervical, transverse Postpartum Procedures: none Complications-Operative and Postpartum: none Hemoglobin  Date Value Range Status  12/22/2012 11.7* 12.0 - 15.0 g/dL Final     HCT  Date Value Range Status  12/22/2012 35.0* 36.0 - 46.0 % Final     REPEATED TO VERIFY    Physical Exam:  General: alert, cooperative, appears stated age and no distress Lochia: appropriate Uterine Fundus: firm Incision: healing well DVT Evaluation: No evidence of DVT seen on physical exam.  Discharge Diagnoses: Term Pregnancy-delivered  Discharge Information: Date: 12/24/2012 Activity: pelvic rest Diet: routine Medications: Ibuprofen and Percocet Condition: stable Instructions: refer to practice specific booklet Discharge to: home   Newborn Data: Live born female  Birth Weight: 7 lb 7.6 oz (3391 g) APGAR: 8, 9  Home with mother.  Shaterria Sager C 12/24/2012, 9:31 AM

## 2012-12-24 NOTE — Op Note (Signed)
NAME:  Priscilla [name], Priscilla [name] NO.:  [id number]  MEDICAL RECORD NO.:  [id number]  LOCATION:                                 FACILITY:  PHYSICIAN:  Juluis Mire, M.D.   DATE OF BIRTH:  Dec 13, 1985  DATE OF PROCEDURE:  12/22/2012 DATE OF DISCHARGE:                              OPERATIVE REPORT   PREOPERATIVE DIAGNOSES:  Intrauterine pregnancy at 40+ weeks with oligohydramnios.  Failure to progress along with fetal intolerance of labor.  POSTOPERATIVE DIAGNOSES:  Intrauterine pregnancy at 40+ weeks with oligohydramnios.  Failure to progress along with fetal intolerance of labor.  PROCEDURES:  Low transverse cesarean section.  SURGEON:  Juluis Mire, M.D.  ANESTHESIA:  Epidural.  ESTIMATED BLOOD LOSS:  400 mL.  PACKS:  None.  DRAINS:  Urethral Foley.  INTRAOPERATIVE BLOOD PLACED:  None.  COMPLICATIONS:  None.  INDICATIONS:  The patient presents at 40+ weeks with oligohydramnios for induction of labor.  Progressed 8-9 cm of dilatation.  Subsequently had arrest of dilatation, despite Pitocin.  Adequate uterine activity. Fetal heart rate started having repetitive late decelerations.  This requires turn off the Pitocin and there was no further progression. Decision was to proceed with primary cesarean section.  The risks were discussed with the risk of infection.  The risk of hemorrhage that could require transfusion with the risk of AIDS or hepatitis.  Risk of injury to adjacent organs including bladder, bowel, ureters, could require further exploratory surgery.  Risk of deep venous thrombosis and pulmonary embolus.  Excessive bleeding could require hysterectomy.  PROCEDURE DESCRIPTION:  The patient was taken to the OR and placed in the supine position with left lateral tilt.  After satisfactory level of epidural anesthesia was obtained, the abdomen was prepped with Dura prep and draped in sterile field.  She had a little bit of sensitivity to the left  side.  It was infiltrated with 0.25% Marcaine.  Subsequently, a low transverse incision was made with the knife and carried through subcutaneous tissue.  Anterior rectus fascia entered sharply, and an incision fashioned laterally.  Fascia taken off the muscle superiorly inferiorly.  Rectus muscles were separated in midline.  Anterior perineum was entered sharply.  Incision of perineum scissors extended both superiorly and inferiorly.  A low-transverse bladder flap was developed.  A low transverse uterine incision was begun with a knife and extended laterally using manual traction.  There was slight meconium- stained fluid.  The infant did present in vertex presentation, was delivered with elevation and fundal pressure.  There was a nuchal cord x1.  Infant was a viable female.  Apgars were 8/9, pH was pending. Placenta was delivered manually and sent to Pathology.  Uterus was exteriorized for closure.  Uterus closed in running locked suture of 0 chromic using a two-layer closure technique.  We had good hemostasis, clear urine output.  Tubes and ovaries were unremarkable.  Uterus was returned to the abdominal cavity.  We irrigated the abdominal cavity. Had good hemostasis.  Urine output remained clear.  Muscles and peritoneum closed with running suture of 3-0 Vicryl.  Fascia was closed with running suture of 0 PDS.  Skin was closed  with running subcuticular 4-0 Monocryl and Dermabond.  Sponge, instrument, and needle count was correct by circulating nurse x3.  Urine output remained clear at the time of closure.  The patient tolerated the procedure well.  Sent to recovery in good condition.     Juluis Mire, M.D.     JSM/MEDQ  D:  12/22/2012  T:  12/22/2012  Job:  [id number]

## 2012-12-26 ENCOUNTER — Inpatient Hospital Stay (HOSPITAL_COMMUNITY): Admission: RE | Admit: 2012-12-26 | Payer: 59 | Source: Ambulatory Visit

## 2012-12-31 NOTE — Progress Notes (Signed)
Post discharge chart review completed.  

## 2013-03-15 ENCOUNTER — Encounter: Payer: Self-pay | Admitting: Family Medicine

## 2013-03-15 ENCOUNTER — Ambulatory Visit (INDEPENDENT_AMBULATORY_CARE_PROVIDER_SITE_OTHER): Payer: 59 | Admitting: Family Medicine

## 2013-03-15 VITALS — BP 120/74 | HR 73

## 2013-03-15 DIAGNOSIS — M542 Cervicalgia: Secondary | ICD-10-CM | POA: Insufficient documentation

## 2013-03-15 DIAGNOSIS — M9981 Other biomechanical lesions of cervical region: Secondary | ICD-10-CM

## 2013-03-15 DIAGNOSIS — M999 Biomechanical lesion, unspecified: Secondary | ICD-10-CM | POA: Insufficient documentation

## 2013-03-15 MED ORDER — KETOROLAC TROMETHAMINE 60 MG/2ML IM SOLN
30.0000 mg | Freq: Once | INTRAMUSCULAR | Status: AC
  Administered 2013-03-15: 30 mg via INTRAMUSCULAR

## 2013-03-15 NOTE — Patient Instructions (Signed)
Patient given handout

## 2013-03-15 NOTE — Progress Notes (Signed)
  I'm seeing this patient by the request  of:    CC: Patient is coming in with acute onset of neck pain. Patient states it started last night. Patient states she thinks it is associated to her sleeping style. Patient has no significant past medical history that is concerning for this problem. Patient denies any radiation of pain, denies any numbness or tingling. Patient describes the pain mostly on the left side, dull aching sensation but finds it very difficult to move her head to the right. Patient states any type of movement or touching seems to aggravate it at this time. Patient has not tried any home modalities at this time. Patient denies any fevers or chills or any headache. Patient is a severity out of 8/10.   Past medical, surgical, family and social history reviewed. Medications reviewed all in the electronic medical record.   Review of Systems: No headache, visual changes, nausea, vomiting, diarrhea, constipation, dizziness, abdominal pain, skin rash, fevers, chills, night sweats, weight loss, swollen lymph nodes, body aches, joint swelling, muscle aches, chest pain, shortness of breath, mood changes.   Objective:    Blood pressure 120/74, pulse 73, SpO2 99.00%.   General: No apparent distress alert and oriented x3 mood and affect normal, dressed appropriately.  HEENT: Pupils equal, extraocular movements intact Respiratory: Patient's speak in full sentences and does not appear short of breath Cardiovascular: No lower extremity edema, non tender, no erythema Skin: Warm dry intact with no signs of infection or rash on extremities or on axial skeleton. Abdomen: Soft nontender Neuro: Cranial nerves II through XII are intact, neurovascularly intact in all extremities with 2+ DTRs and 2+ pulses. Lymph: No lymphadenopathy of posterior or anterior cervical chain or axillae bilaterally.  Gait normal with good balance and coordination.  MSK: Non tender with full range of motion and good  stability and symmetric strength and tone of shoulders, elbows, wrist, hip, knee and ankles bilaterally.  Neck: Inspection unremarkable. No palpable stepoffs. Negative Spurling's maneuver. Linda range of motion with rotation to the right secondary to muscle spasm Grip strength and sensation normal in bilateral hands Strength good C4 to T1 distribution No sensory change to C4 to T1 Negative Hoffman sign bilaterally Reflexes normal OMT Physical Exam  Standing structural       Occiput neutral  Shoulder left higher  Inferior angle of scapula left higher  Illiac crest neutral   Standing flexion right  Seated Flexion right  Cervical  C2 F RS right C6 F RS left  Thoracic Elevated first rib    Sacrum left on left        Impression and Recommendations:    Neck pain, acute No signs of nerve impingement and this seems to be a muscle spasm. Patient did respond well to osteopathic manipulation today., Given home exercises, Toradol shot today, discussed anti-inflammatories as well as icing. Followup in one week if not better.  Nonallopathic lesion of cervical region Decision today to treat with OMT was based on Physical Exam  After verbal consent patient was treated with HVLA and ME techniques in cervical areas  Patient tolerated the procedure well with improvement in symptoms  Patient given exercises, stretches and lifestyle modifications  See medications in patient instructions if given  Patient will follow up in 1-2 weeks     This case required medical decision making of moderate complexity.

## 2013-03-15 NOTE — Addendum Note (Signed)
Addended by: Edwena Felty T on: 03/15/2013 03:36 PM   Modules accepted: Orders

## 2013-03-15 NOTE — Assessment & Plan Note (Signed)
No signs of nerve impingement and this seems to be a muscle spasm. Patient did respond well to osteopathic manipulation today., Given home exercises, Toradol shot today, discussed anti-inflammatories as well as icing. Followup in one week if not better.

## 2013-03-15 NOTE — Assessment & Plan Note (Signed)
Decision today to treat with OMT was based on Physical Exam  After verbal consent patient was treated with HVLA and ME techniques in cervical areas  Patient tolerated the procedure well with improvement in symptoms  Patient given exercises, stretches and lifestyle modifications  See medications in patient instructions if given  Patient will follow up in 1-2 weeks

## 2013-03-18 ENCOUNTER — Encounter: Payer: Self-pay | Admitting: Internal Medicine

## 2013-04-08 ENCOUNTER — Ambulatory Visit (INDEPENDENT_AMBULATORY_CARE_PROVIDER_SITE_OTHER): Payer: 59 | Admitting: Internal Medicine

## 2013-04-08 ENCOUNTER — Encounter: Payer: Self-pay | Admitting: Internal Medicine

## 2013-04-08 VITALS — BP 110/72 | HR 72 | Temp 99.0°F

## 2013-04-08 DIAGNOSIS — H6692 Otitis media, unspecified, left ear: Secondary | ICD-10-CM

## 2013-04-08 DIAGNOSIS — J019 Acute sinusitis, unspecified: Secondary | ICD-10-CM

## 2013-04-08 DIAGNOSIS — B379 Candidiasis, unspecified: Secondary | ICD-10-CM

## 2013-04-08 DIAGNOSIS — H6690 Otitis media, unspecified, unspecified ear: Secondary | ICD-10-CM

## 2013-04-08 MED ORDER — FLUCONAZOLE 150 MG PO TABS: 150.0000 mg | ORAL_TABLET | Freq: Once | ORAL | Status: DC

## 2013-04-08 MED ORDER — AMOXICILLIN 500 MG PO CAPS: 500.0000 mg | ORAL_CAPSULE | Freq: Three times a day (TID) | ORAL | Status: DC

## 2013-04-08 NOTE — Patient Instructions (Signed)

## 2013-04-08 NOTE — Progress Notes (Signed)
HPI  Pt presents to the clinic today with c/o headache, nasal congestion, sore throat, and left ear pain. Theses symptoms started 10 days ago. She is blowing green nasal discharge out of her nose. She has been running low grade fevers. She has not taken anything OTC except tylenol because she is breastfeeding. She has been running low grade fevers. She has no history of allergies or asthma. She has had sick contacts.  Review of Systems    Past Medical History  Diagnosis Date  . Abnormal Pap smear   . Hx of varicella     Family History  Problem Relation Age of Onset  . Diabetes Mother   . Hyperlipidemia Father   . Hypertension Father   . Diabetes Father   . Cancer Maternal Grandmother     leaukemia    History   Social History  . Marital Status: Married    Spouse Name: N/A    Number of Children: N/A  . Years of Education: N/A   Occupational History  . Not on file.   Social History Main Topics  . Smoking status: Former Smoker    Quit date: 06/01/2008  . Smokeless tobacco: Not on file  . Alcohol Use: 0.6 oz/week    1 Glasses of wine per week  . Drug Use: No  . Sexual Activity: Yes   Other Topics Concern  . Not on file   Social History Narrative   Caffienated drinks-yes   Seat belt use often-yes   Regular Exercise-yes   Smoke alarm in the home-yes   Firearms/guns in the home-yes   History of physical abuse-no                No Known Allergies   Constitutional: Positive headache, fatigue and fever. Denies abrupt weight changes.  HEENT:  Positive nasal congestion and sore throat. Denies eye redness, ear pain, ringing in the ears, wax buildup, runny nose or bloody nose. Respiratory: Positive cough. Denies difficulty breathing or shortness of breath.  Cardiovascular: Denies chest pain, chest tightness, palpitations or swelling in the hands or feet.   No other specific complaints in a complete review of systems (except as listed in HPI above).  Objective:     BP 110/72  Pulse 72  Temp(Src) 99 F (37.2 C) (Oral)  SpO2 98% Wt Readings from Last 3 Encounters:  12/20/12 178 lb (80.74 kg)  12/20/12 178 lb (80.74 kg)  12/13/11 135 lb (61.236 kg)    General: Appears his stated age, well developed, well nourished in NAD. HEENT: Head: normal shape and size; Eyes: sclera white, no icterus, conjunctiva pink, PERRLA and EOMs intact; Ears: Tm's gray and intact, normal light reflex; Nose: mucosa pink and moist, septum midline; Throat/Mouth: + PND. Teeth present, mucosa pink and moist, no exudate noted, no lesions or ulcerations noted.  Neck: Mild cervical lymphadenopathy. Neck supple, trachea midline. No massses, lumps or thyromegaly present.  Cardiovascular: Normal rate and rhythm. S1,S2 noted.  No murmur, rubs or gallops noted. No JVD or BLE edema. No carotid bruits noted. Pulmonary/Chest: Normal effort and positive vesicular breath sounds. No respiratory distress. No wheezes, rales or ronchi noted.      Assessment & Plan:   Acute bacterial sinusitis with left otitis media, new onset:  Amoxicillin 500 mg TID x 10 days Tylenol for pain/fever erx for diflucan 150 mg po x 1 for antibiotic induced yeast infection  RTC as needed or if symptoms persist.

## 2013-05-17 ENCOUNTER — Encounter: Payer: 59 | Admitting: Internal Medicine

## 2013-05-31 ENCOUNTER — Encounter: Payer: 59 | Admitting: Internal Medicine

## 2013-06-06 ENCOUNTER — Other Ambulatory Visit: Payer: Self-pay

## 2013-06-19 ENCOUNTER — Encounter: Payer: Self-pay | Admitting: Family Medicine

## 2013-06-19 ENCOUNTER — Ambulatory Visit (INDEPENDENT_AMBULATORY_CARE_PROVIDER_SITE_OTHER): Payer: 59 | Admitting: Family Medicine

## 2013-06-19 VITALS — BP 110/75 | HR 91 | Temp 99.8°F | Resp 18 | Ht 62.0 in | Wt 150.0 lb

## 2013-06-19 DIAGNOSIS — J019 Acute sinusitis, unspecified: Secondary | ICD-10-CM | POA: Insufficient documentation

## 2013-06-19 MED ORDER — AMOXICILLIN 875 MG PO TABS: 875.0000 mg | ORAL_TABLET | Freq: Two times a day (BID) | ORAL | Status: DC

## 2013-06-19 NOTE — Progress Notes (Signed)
OFFICE NOTE  06/19/2013  CC:  Chief Complaint  Patient presents with  . Sinusitis     HPI: Patient is a 27 y.o. Caucasian female who is here for URI sx's. Onset about 2 wks ago after her son had croup.  Runny nose, cough productive of green sputum, +facial pain diffusely around sinuses, no fever.  No wheeze, chest tightness, or SOB.  No ST.  Some HA. Sx's worse in morning and evening.   Has taken nothing for sx's except saline nose spray b/c she is breast feeding still. Mild fatigue and upper body aches.  No n/v/d/rash.   Pertinent PMH:   Past Medical History  Diagnosis Date  . Abnormal Pap smear   . Hx of varicella    Past Surgical History  Procedure Laterality Date  . Colposcopy    . Cesarean section N/A 12/22/2012    Procedure: PRIMARY CESAREAN SECTION;  Surgeon: Juluis Mire, MD;  Location: [facility];  Service: Obstetrics;  Laterality: N/A;   History   Social History Narrative   Caffienated drinks-yes   Seat belt use often-yes   Regular Exercise-yes   Smoke alarm in the home-yes   Firearms/guns in the home-yes   History of physical abuse-no                MEDS:  NONE  PE: Blood pressure 110/75, pulse 91, temperature 99.8 F (37.7 C), temperature source Temporal, resp. rate 18, height 5\' 2"  (1.575 m), weight 150 lb (68.04 kg), SpO2 99.00%, currently breastfeeding. VS: noted--normal. Gen: alert, NAD, NONTOXIC APPEARING. HEENT: eyes without injection, drainage, or swelling.  Ears: EACs clear, TMs with normal light reflex and landmarks.  Nose: Clear rhinorrhea, with some dried, crusty exudate adherent to mildly injected mucosa.  No purulent d/c.  No paranasal sinus TTP.  No facial swelling.  Throat and mouth without focal lesion.  No pharyngial swelling, erythema, or exudate.   Neck: supple, no LAD.   LUNGS: CTA bilat, nonlabored resps.   CV: RRR, no m/r/g. EXT: no c/c/e SKIN: no rash  IMPRESSION AND PLAN:  Acute sinusitis, breast feeding mother. Amoxil  875mg  bid x 10d. Mucinex DM/Robitussin DM both ok in BF. Allegra or Claritin both ok to try. Continue saline nasal spray.  FOLLOW UP: prn

## 2013-06-26 ENCOUNTER — Encounter: Payer: Self-pay | Admitting: Internal Medicine

## 2013-06-26 ENCOUNTER — Ambulatory Visit (INDEPENDENT_AMBULATORY_CARE_PROVIDER_SITE_OTHER): Payer: 59 | Admitting: Internal Medicine

## 2013-06-26 VITALS — BP 118/82 | HR 79 | Temp 98.1°F | Resp 16 | Ht 62.0 in | Wt 153.0 lb

## 2013-06-26 DIAGNOSIS — Z0000 Encounter for general adult medical examination without abnormal findings: Secondary | ICD-10-CM

## 2013-06-26 NOTE — Progress Notes (Signed)
   Subjective:    Patient ID: Priscilla [name], female    DOB: June 06, 1986, 27 y.o.   MRN: [id number]  HPI  She returns for a physical - she feels well and offers no complaints.  Review of Systems  All other systems reviewed and are negative.       Objective:   Physical Exam  Vitals reviewed. Constitutional: She is oriented to person, place, and time. She appears well-developed and well-nourished. No distress.  HENT:  Head: Normocephalic and atraumatic.  Mouth/Throat: Oropharynx is clear and moist. No oropharyngeal exudate.  Eyes: Conjunctivae are normal. Right eye exhibits no discharge. Left eye exhibits no discharge. No scleral icterus.  Neck: Normal range of motion. Neck supple. No JVD present. No tracheal deviation present. No thyromegaly present.  Cardiovascular: Normal rate, regular rhythm, normal heart sounds and intact distal pulses.  Exam reveals no gallop and no friction rub.   No murmur heard. Pulmonary/Chest: Effort normal and breath sounds normal. No stridor. No respiratory distress. She has no wheezes. She has no rales. She exhibits no tenderness.  Abdominal: Soft. Bowel sounds are normal. She exhibits no distension and no mass. There is no tenderness. There is no rebound and no guarding.  Musculoskeletal: Normal range of motion. She exhibits no tenderness.  Lymphadenopathy:    She has no cervical adenopathy.  Neurological: She is oriented to person, place, and time.  Skin: Skin is warm and dry. No rash noted. She is not diaphoretic. No erythema. No pallor.  Psychiatric: She has a normal mood and affect. Her behavior is normal. Judgment and thought content normal.     Lab Results  Component Value Date   WBC 31.8* 12/22/2012   HGB 11.7* 12/22/2012   HCT 35.0* 12/22/2012   PLT 204 12/22/2012   GLUCOSE 86 06/06/2011   CHOL 182 06/06/2011   TRIG 31.0 06/06/2011   HDL 72.00 06/06/2011   LDLCALC 104* 06/06/2011   ALT 17 06/06/2011   AST 25 06/06/2011   NA 141 06/06/2011   K  4.2 06/06/2011   CL 108 06/06/2011   CREATININE 0.7 06/06/2011   BUN 13 06/06/2011   CO2 24 06/06/2011   TSH 1.05 06/06/2011       Assessment & Plan:

## 2013-06-26 NOTE — Progress Notes (Signed)
Pre visit review using our clinic review tool, if applicable. No additional management support is needed unless otherwise documented below in the visit note. 

## 2013-06-26 NOTE — Patient Instructions (Signed)
Preventive Care for Adults, Female A healthy lifestyle and preventive care can promote health and wellness. Preventive health guidelines for women include the following key practices.  A routine yearly physical is a good way to check with your caregiver about your health and preventive screening. It is a chance to share any concerns and updates on your health, and to receive a thorough exam.  Visit your dentist for a routine exam and preventive care every 6 months. Brush your teeth twice a day and floss once a day. Good oral hygiene prevents tooth decay and gum disease.  The frequency of eye exams is based on your age, health, family medical history, use of contact lenses, and other factors. Follow your caregiver's recommendations for frequency of eye exams.  Eat a healthy diet. Foods like vegetables, fruits, whole grains, low-fat dairy products, and lean protein foods contain the nutrients you need without too many calories. Decrease your intake of foods high in solid fats, added sugars, and salt. Eat the right amount of calories for you.Get information about a proper diet from your caregiver, if necessary.  Regular physical exercise is one of the most important things you can do for your health. Most adults should get at least 150 minutes of moderate-intensity exercise (any activity that increases your heart rate and causes you to sweat) each week. In addition, most adults need muscle-strengthening exercises on 2 or more days a week.  Maintain a healthy weight. The body mass index (BMI) is a screening tool to identify possible weight problems. It provides an estimate of body fat based on height and weight. Your caregiver can help determine your BMI, and can help you achieve or maintain a healthy weight.For adults 20 years and older:  A BMI below 18.5 is considered underweight.  A BMI of 18.5 to 24.9 is normal.  A BMI of 25 to 29.9 is considered overweight.  A BMI of 30 and above is  considered obese.  Maintain normal blood lipids and cholesterol levels by exercising and minimizing your intake of saturated fat. Eat a balanced diet with plenty of fruit and vegetables. Blood tests for lipids and cholesterol should begin at age 20 and be repeated every 5 years. If your lipid or cholesterol levels are high, you are over 50, or you are at high risk for heart disease, you may need your cholesterol levels checked more frequently.Ongoing high lipid and cholesterol levels should be treated with medicines if diet and exercise are not effective.  If you smoke, find out from your caregiver how to quit. If you do not use tobacco, do not start.  Lung cancer screening is recommended for adults aged 55 80 years who are at high risk for developing lung cancer because of a history of smoking. Yearly low-dose computed tomography (CT) is recommended for people who have at least a 30-pack-year history of smoking and are a current smoker or have quit within the past 15 years. A pack year of smoking is smoking an average of 1 pack of cigarettes a day for 1 year (for example: 1 pack a day for 30 years or 2 packs a day for 15 years). Yearly screening should continue until the smoker has stopped smoking for at least 15 years. Yearly screening should also be stopped for people who develop a health problem that would prevent them from having lung cancer treatment.  If you are pregnant, do not drink alcohol. If you are breastfeeding, be very cautious about drinking alcohol. If you are   not pregnant and choose to drink alcohol, do not exceed 1 drink per day. One drink is considered to be 12 ounces (355 mL) of beer, 5 ounces (148 mL) of wine, or 1.5 ounces (44 mL) of liquor.  Avoid use of street drugs. Do not share needles with anyone. Ask for help if you need support or instructions about stopping the use of drugs.  High blood pressure causes heart disease and increases the risk of stroke. Your blood pressure  should be checked at least every 1 to 2 years. Ongoing high blood pressure should be treated with medicines if weight loss and exercise are not effective.  If you are 55 to 27 years old, ask your caregiver if you should take aspirin to prevent strokes.  Diabetes screening involves taking a blood sample to check your fasting blood sugar level. This should be done once every 3 years, after age 45, if you are within normal weight and without risk factors for diabetes. Testing should be considered at a younger age or be carried out more frequently if you are overweight and have at least 1 risk factor for diabetes.  Breast cancer screening is essential preventive care for women. You should practice "breast self-awareness." This means understanding the normal appearance and feel of your breasts and may include breast self-examination. Any changes detected, no matter how small, should be reported to a caregiver. Women in their 20s and 30s should have a clinical breast exam (CBE) by a caregiver as part of a regular health exam every 1 to 3 years. After age 40, women should have a CBE every year. Starting at age 40, women should consider having a mammography (breast X-ray test) every year. Women who have a family history of breast cancer should talk to their caregiver about genetic screening. Women at a high risk of breast cancer should talk to their caregivers about having magnetic resonance imaging (MRI) and a mammography every year.  Breast cancer gene (BRCA)-related cancer risk assessment is recommended for women who have family members with BRCA-related cancers. BRCA-related cancers include breast, ovarian, tubal, and peritoneal cancers. Having family members with these cancers may be associated with an increased risk for harmful changes (mutations) in the breast cancer genes BRCA1 and BRCA2. Results of the assessment will determine the need for genetic counseling and BRCA1 and BRCA2 testing.  The Pap test is  a screening test for cervical cancer. A Pap test can show cell changes on the cervix that might become cervical cancer if left untreated. A Pap test is a procedure in which cells are obtained and examined from the lower end of the uterus (cervix).  Women should have a Pap test starting at age 21.  Between ages 21 and 29, Pap tests should be repeated every 2 years.  Beginning at age 30, you should have a Pap test every 3 years as long as the past 3 Pap tests have been normal.  Some women have medical problems that increase the chance of getting cervical cancer. Talk to your caregiver about these problems. It is especially important to talk to your caregiver if a new problem develops soon after your last Pap test. In these cases, your caregiver may recommend more frequent screening and Pap tests.  The above recommendations are the same for women who have or have not gotten the vaccine for human papillomavirus (HPV).  If you had a hysterectomy for a problem that was not cancer or a condition that could lead to cancer, then   you no longer need Pap tests. Even if you no longer need a Pap test, a regular exam is a good idea to make sure no other problems are starting.  If you are between ages 65 and 70, and you have had normal Pap tests going back 10 years, you no longer need Pap tests. Even if you no longer need a Pap test, a regular exam is a good idea to make sure no other problems are starting.  If you have had past treatment for cervical cancer or a condition that could lead to cancer, you need Pap tests and screening for cancer for at least 20 years after your treatment.  If Pap tests have been discontinued, risk factors (such as a new sexual partner) need to be reassessed to determine if screening should be resumed.  The HPV test is an additional test that may be used for cervical cancer screening. The HPV test looks for the virus that can cause the cell changes on the cervix. The cells collected  during the Pap test can be tested for HPV. The HPV test could be used to screen women aged 30 years and older, and should be used in women of any age who have unclear Pap test results. After the age of 30, women should have HPV testing at the same frequency as a Pap test.  Colorectal cancer can be detected and often prevented. Most routine colorectal cancer screening begins at the age of 50 and continues through age 75. However, your caregiver may recommend screening at an earlier age if you have risk factors for colon cancer. On a yearly basis, your caregiver may provide home test kits to check for hidden blood in the stool. Use of a small camera at the end of a tube, to directly examine the colon (sigmoidoscopy or colonoscopy), can detect the earliest forms of colorectal cancer. Talk to your caregiver about this at age 50, when routine screening begins. Direct examination of the colon should be repeated every 5 to 10 years through age 75, unless early forms of pre-cancerous polyps or small growths are found.  Hepatitis C blood testing is recommended for all people born from 1945 through 1965 and any individual with known risks for hepatitis C.  Practice safe sex. Use condoms and avoid high-risk sexual practices to reduce the spread of sexually transmitted infections (STIs). STIs include gonorrhea, chlamydia, syphilis, trichomonas, herpes, HPV, and human immunodeficiency virus (HIV). Herpes, HIV, and HPV are viral illnesses that have no cure. They can result in disability, cancer, and death. Sexually active women aged 25 and younger should be checked for chlamydia. Older women with new or multiple partners should also be tested for chlamydia. Testing for other STIs is recommended if you are sexually active and at increased risk.  Osteoporosis is a disease in which the bones lose minerals and strength with aging. This can result in serious bone fractures. The risk of osteoporosis can be identified using a  bone density scan. Women ages 65 and over and women at risk for fractures or osteoporosis should discuss screening with their caregivers. Ask your caregiver whether you should take a calcium supplement or vitamin D to reduce the rate of osteoporosis.  Menopause can be associated with physical symptoms and risks. Hormone replacement therapy is available to decrease symptoms and risks. You should talk to your caregiver about whether hormone replacement therapy is right for you.  Use sunscreen. Apply sunscreen liberally and repeatedly throughout the day. You should seek shade   when your shadow is shorter than you. Protect yourself by wearing long sleeves, pants, a wide-brimmed hat, and sunglasses year round, whenever you are outdoors.  Once a month, do a whole body skin exam, using a mirror to look at the skin on your back. Notify your caregiver of new moles, moles that have irregular borders, moles that are larger than a pencil eraser, or moles that have changed in shape or color.  Stay current with required immunizations.  Influenza vaccine. All adults should be immunized every year.  Tetanus, diphtheria, and acellular pertussis (Td, Tdap) vaccine. Pregnant women should receive 1 dose of Tdap vaccine during each pregnancy. The dose should be obtained regardless of the length of time since the last dose. Immunization is preferred during the 27th to 36th week of gestation. An adult who has not previously received Tdap or who does not know her vaccine status should receive 1 dose of Tdap. This initial dose should be followed by tetanus and diphtheria toxoids (Td) booster doses every 10 years. Adults with an unknown or incomplete history of completing a 3-dose immunization series with Td-containing vaccines should begin or complete a primary immunization series including a Tdap dose. Adults should receive a Td booster every 10 years.  Varicella vaccine. An adult without evidence of immunity to varicella  should receive 2 doses or a second dose if she has previously received 1 dose. Pregnant females who do not have evidence of immunity should receive the first dose after pregnancy. This first dose should be obtained before leaving the health care facility. The second dose should be obtained 4 8 weeks after the first dose.  Human papillomavirus (HPV) vaccine. Females aged 13 26 years who have not received the vaccine previously should obtain the 3-dose series. The vaccine is not recommended for use in pregnant females. However, pregnancy testing is not needed before receiving a dose. If a female is found to be pregnant after receiving a dose, no treatment is needed. In that case, the remaining doses should be delayed until after the pregnancy. Immunization is recommended for any person with an immunocompromised condition through the age of 26 years if she did not get any or all doses earlier. During the 3-dose series, the second dose should be obtained 4 8 weeks after the first dose. The third dose should be obtained 24 weeks after the first dose and 16 weeks after the second dose.  Zoster vaccine. One dose is recommended for adults aged 60 years or older unless certain conditions are present.  Measles, mumps, and rubella (MMR) vaccine. Adults born before 1957 generally are considered immune to measles and mumps. Adults born in 1957 or later should have 1 or more doses of MMR vaccine unless there is a contraindication to the vaccine or there is laboratory evidence of immunity to each of the three diseases. A routine second dose of MMR vaccine should be obtained at least 28 days after the first dose for students attending postsecondary schools, health care workers, or international travelers. People who received inactivated measles vaccine or an unknown type of measles vaccine during 1963 1967 should receive 2 doses of MMR vaccine. People who received inactivated mumps vaccine or an unknown type of mumps vaccine  before 1979 and are at high risk for mumps infection should consider immunization with 2 doses of MMR vaccine. For females of childbearing age, rubella immunity should be determined. If there is no evidence of immunity, females who are not pregnant should be vaccinated. If there   is no evidence of immunity, females who are pregnant should delay immunization until after pregnancy. Unvaccinated health care workers born before 1957 who lack laboratory evidence of measles, mumps, or rubella immunity or laboratory confirmation of disease should consider measles and mumps immunization with 2 doses of MMR vaccine or rubella immunization with 1 dose of MMR vaccine.  Pneumococcal 13-valent conjugate (PCV13) vaccine. When indicated, a person who is uncertain of her immunization history and has no record of immunization should receive the PCV13 vaccine. An adult aged 19 years or older who has certain medical conditions and has not been previously immunized should receive 1 dose of PCV13 vaccine. This PCV13 should be followed with a dose of pneumococcal polysaccharide (PPSV23) vaccine. The PPSV23 vaccine dose should be obtained at least 8 weeks after the dose of PCV13 vaccine. An adult aged 19 years or older who has certain medical conditions and previously received 1 or more doses of PPSV23 vaccine should receive 1 dose of PCV13. The PCV13 vaccine dose should be obtained 1 or more years after the last PPSV23 vaccine dose.  Pneumococcal polysaccharide (PPSV23) vaccine. When PCV13 is also indicated, PCV13 should be obtained first. All adults aged 65 years and older should be immunized. An adult younger than age 65 years who has certain medical conditions should be immunized. Any person who resides in a nursing home or long-term care facility should be immunized. An adult smoker should be immunized. People with an immunocompromised condition and certain other conditions should receive both PCV13 and PPSV23 vaccines. People  with human immunodeficiency virus (HIV) infection should be immunized as soon as possible after diagnosis. Immunization during chemotherapy or radiation therapy should be avoided. Routine use of PPSV23 vaccine is not recommended for American Indians, [place] Natives, or people younger than 65 years unless there are medical conditions that require PPSV23 vaccine. When indicated, people who have unknown immunization and have no record of immunization should receive PPSV23 vaccine. One-time revaccination 5 years after the first dose of PPSV23 is recommended for people aged 19 64 years who have chronic kidney failure, nephrotic syndrome, asplenia, or immunocompromised conditions. People who received 1 2 doses of PPSV23 before age 65 years should receive another dose of PPSV23 vaccine at age 65 years or later if at least 5 years have passed since the previous dose. Doses of PPSV23 are not needed for people immunized with PPSV23 at or after age 65 years.  Meningococcal vaccine. Adults with asplenia or persistent complement component deficiencies should receive 2 doses of quadrivalent meningococcal conjugate (MenACWY-D) vaccine. The doses should be obtained at least 2 months apart. Microbiologists working with certain meningococcal bacteria, military recruits, people at risk during an outbreak, and people who travel to or live in countries with a high rate of meningitis should be immunized. A first-year college student up through age 21 years who is living in a residence hall should receive a dose if she did not receive a dose on or after her 16th birthday. Adults who have certain high-risk conditions should receive one or more doses of vaccine.  Hepatitis A vaccine. Adults who wish to be protected from this disease, have certain high-risk conditions, work with hepatitis A-infected animals, work in hepatitis A research labs, or travel to or work in countries with a high rate of hepatitis A should be immunized. Adults  who were previously unvaccinated and who anticipate close contact with an international adoptee during the first 60 days after arrival in the [place] from a country   with a high rate of hepatitis A should be immunized.  Hepatitis B vaccine. Adults who wish to be protected from this disease, have certain high-risk conditions, may be exposed to blood or other infectious body fluids, are household contacts or sex partners of hepatitis B positive people, are clients or workers in certain care facilities, or travel to or work in countries with a high rate of hepatitis B should be immunized.  Haemophilus influenzae type b (Hib) vaccine. A previously unvaccinated person with asplenia or sickle cell disease or having a scheduled splenectomy should receive 1 dose of Hib vaccine. Regardless of previous immunization, a recipient of a hematopoietic stem cell transplant should receive a 3-dose series 6 12 months after her successful transplant. Hib vaccine is not recommended for adults with HIV infection. Preventive Services / Frequency Ages 19 to 39  Blood pressure check.** / Every 1 to 2 years.  Lipid and cholesterol check.** / Every 5 years beginning at age 20.  Clinical breast exam.** / Every 3 years for women in their 20s and 30s.  BRCA-related cancer risk assessment.** / For women who have family members with a BRCA-related cancer (breast, ovarian, tubal, or peritoneal cancers).  Pap test.** / Every 2 years from ages 21 through 29. Every 3 years starting at age 30 through age 65 or 70 with a history of 3 consecutive normal Pap tests.  HPV screening.** / Every 3 years from ages 30 through ages 65 to 70 with a history of 3 consecutive normal Pap tests.  Hepatitis C blood test.** / For any individual with known risks for hepatitis C.  Skin self-exam. / Monthly.  Influenza vaccine. / Every year.  Tetanus, diphtheria, and acellular pertussis (Tdap, Td) vaccine.** / Consult your caregiver. Pregnant  women should receive 1 dose of Tdap vaccine during each pregnancy. 1 dose of Td every 10 years.  Varicella vaccine.** / Consult your caregiver. Pregnant females who do not have evidence of immunity should receive the first dose after pregnancy.  HPV vaccine. / 3 doses over 6 months, if 26 and younger. The vaccine is not recommended for use in pregnant females. However, pregnancy testing is not needed before receiving a dose.  Measles, mumps, rubella (MMR) vaccine.** / You need at least 1 dose of MMR if you were born in 1957 or later. You may also need a 2nd dose. For females of childbearing age, rubella immunity should be determined. If there is no evidence of immunity, females who are not pregnant should be vaccinated. If there is no evidence of immunity, females who are pregnant should delay immunization until after pregnancy.  Pneumococcal 13-valent conjugate (PCV13) vaccine.** / Consult your caregiver.  Pneumococcal polysaccharide (PPSV23) vaccine.** / 1 to 2 doses if you smoke cigarettes or if you have certain conditions.  Meningococcal vaccine.** / 1 dose if you are age 19 to 21 years and a first-year college student living in a residence hall, or have one of several medical conditions, you need to get vaccinated against meningococcal disease. You may also need additional booster doses.  Hepatitis A vaccine.** / Consult your caregiver.  Hepatitis B vaccine.** / Consult your caregiver.  Haemophilus influenzae type b (Hib) vaccine.** / Consult your caregiver. Ages 40 to 64  Blood pressure check.** / Every 1 to 2 years.  Lipid and cholesterol check.** / Every 5 years beginning at age 20.  Lung cancer screening. / Every year if you are aged 55 80 years and have a 30-pack-year history of smoking and   currently smoke or have quit within the past 15 years. Yearly screening is stopped once you have quit smoking for at least 15 years or develop a health problem that would prevent you from having  lung cancer treatment.  Clinical breast exam.** / Every year after age 40.  BRCA-related cancer risk assessment.** / For women who have family members with a BRCA-related cancer (breast, ovarian, tubal, or peritoneal cancers).  Mammogram.** / Every year beginning at age 40 and continuing for as long as you are in good health. Consult with your caregiver.  Pap test.** / Every 3 years starting at age 30 through age 65 or 70 with a history of 3 consecutive normal Pap tests.  HPV screening.** / Every 3 years from ages 30 through ages 65 to 70 with a history of 3 consecutive normal Pap tests.  Fecal occult blood test (FOBT) of stool. / Every year beginning at age 50 and continuing until age 75. You may not need to do this test if you get a colonoscopy every 10 years.  Flexible sigmoidoscopy or colonoscopy.** / Every 5 years for a flexible sigmoidoscopy or every 10 years for a colonoscopy beginning at age 50 and continuing until age 75.  Hepatitis C blood test.** / For all people born from 1945 through 1965 and any individual with known risks for hepatitis C.  Skin self-exam. / Monthly.  Influenza vaccine. / Every year.  Tetanus, diphtheria, and acellular pertussis (Tdap/Td) vaccine.** / Consult your caregiver. Pregnant women should receive 1 dose of Tdap vaccine during each pregnancy. 1 dose of Td every 10 years.  Varicella vaccine.** / Consult your caregiver. Pregnant females who do not have evidence of immunity should receive the first dose after pregnancy.  Zoster vaccine.** / 1 dose for adults aged 60 years or older.  Measles, mumps, rubella (MMR) vaccine.** / You need at least 1 dose of MMR if you were born in 1957 or later. You may also need a 2nd dose. For females of childbearing age, rubella immunity should be determined. If there is no evidence of immunity, females who are not pregnant should be vaccinated. If there is no evidence of immunity, females who are pregnant should delay  immunization until after pregnancy.  Pneumococcal 13-valent conjugate (PCV13) vaccine.** / Consult your caregiver.  Pneumococcal polysaccharide (PPSV23) vaccine.** / 1 to 2 doses if you smoke cigarettes or if you have certain conditions.  Meningococcal vaccine.** / Consult your caregiver.  Hepatitis A vaccine.** / Consult your caregiver.  Hepatitis B vaccine.** / Consult your caregiver.  Haemophilus influenzae type b (Hib) vaccine.** / Consult your caregiver. Ages 65 and over  Blood pressure check.** / Every 1 to 2 years.  Lipid and cholesterol check.** / Every 5 years beginning at age 20.  Lung cancer screening. / Every year if you are aged 55 80 years and have a 30-pack-year history of smoking and currently smoke or have quit within the past 15 years. Yearly screening is stopped once you have quit smoking for at least 15 years or develop a health problem that would prevent you from having lung cancer treatment.  Clinical breast exam.** / Every year after age 40.  BRCA-related cancer risk assessment.** / For women who have family members with a BRCA-related cancer (breast, ovarian, tubal, or peritoneal cancers).  Mammogram.** / Every year beginning at age 40 and continuing for as long as you are in good health. Consult with your caregiver.  Pap test.** / Every 3 years starting at age   30 through age 65 or 70 with a 3 consecutive normal Pap tests. Testing can be stopped between 65 and 70 with 3 consecutive normal Pap tests and no abnormal Pap or HPV tests in the past 10 years.  HPV screening.** / Every 3 years from ages 30 through ages 65 or 70 with a history of 3 consecutive normal Pap tests. Testing can be stopped between 65 and 70 with 3 consecutive normal Pap tests and no abnormal Pap or HPV tests in the past 10 years.  Fecal occult blood test (FOBT) of stool. / Every year beginning at age 50 and continuing until age 75. You may not need to do this test if you get a colonoscopy  every 10 years.  Flexible sigmoidoscopy or colonoscopy.** / Every 5 years for a flexible sigmoidoscopy or every 10 years for a colonoscopy beginning at age 50 and continuing until age 75.  Hepatitis C blood test.** / For all people born from 1945 through 1965 and any individual with known risks for hepatitis C.  Osteoporosis screening.** / A one-time screening for women ages 65 and over and women at risk for fractures or osteoporosis.  Skin self-exam. / Monthly.  Influenza vaccine. / Every year.  Tetanus, diphtheria, and acellular pertussis (Tdap/Td) vaccine.** / 1 dose of Td every 10 years.  Varicella vaccine.** / Consult your caregiver.  Zoster vaccine.** / 1 dose for adults aged 60 years or older.  Pneumococcal 13-valent conjugate (PCV13) vaccine.** / Consult your caregiver.  Pneumococcal polysaccharide (PPSV23) vaccine.** / 1 dose for all adults aged 65 years and older.  Meningococcal vaccine.** / Consult your caregiver.  Hepatitis A vaccine.** / Consult your caregiver.  Hepatitis B vaccine.** / Consult your caregiver.  Haemophilus influenzae type b (Hib) vaccine.** / Consult your caregiver. ** Family history and personal history of risk and conditions may change your caregiver's recommendations. Document Released: 09/13/2001 Document Revised: 11/12/2012 Document Reviewed: 12/13/2010 [employer] Patient Information 2014 [employer].  

## 2013-06-30 NOTE — Assessment & Plan Note (Signed)
Exam done Vaccines were reviewed Labs ordered Pt ed material was given 

## 2013-07-02 ENCOUNTER — Other Ambulatory Visit (INDEPENDENT_AMBULATORY_CARE_PROVIDER_SITE_OTHER): Payer: 59

## 2013-07-02 ENCOUNTER — Encounter: Payer: Self-pay | Admitting: Internal Medicine

## 2013-07-02 DIAGNOSIS — Z0000 Encounter for general adult medical examination without abnormal findings: Secondary | ICD-10-CM

## 2013-07-02 LAB — LIPID PANEL
HDL: 56.6 mg/dL (ref >39.00)
LDL Cholesterol: 117 mg/dL — ABNORMAL HIGH (ref 0–99)
Total CHOL/HDL Ratio: 3
Triglycerides: 68 mg/dL (ref 0.0–149.0)

## 2013-07-02 LAB — CBC WITH DIFFERENTIAL/PLATELET
Basophils Absolute: 0 10*3/uL (ref 0.0–0.1)
Eosinophils Absolute: 0.1 10*3/uL (ref 0.0–0.7)
HCT: 40.4 % (ref 36.0–46.0)
Lymphs Abs: 2.3 10*3/uL (ref 0.7–4.0)
Monocytes Relative: 9.4 % (ref 3.0–12.0)
Platelets: 314 10*3/uL (ref 150.0–400.0)
RDW: 12.9 % (ref 11.5–14.6)

## 2013-07-02 LAB — COMPREHENSIVE METABOLIC PANEL
ALT: 13 U/L (ref 0–35)
AST: 16 U/L (ref 0–37)
Alkaline Phosphatase: 85 U/L (ref 39–117)
CO2: 26 mEq/L (ref 19–32)
GFR: 115.76 mL/min (ref >60.00)
Sodium: 138 mEq/L (ref 135–145)
Total Bilirubin: 0.4 mg/dL (ref 0.3–1.2)
Total Protein: 7.7 g/dL (ref 6.0–8.3)

## 2013-12-20 ENCOUNTER — Encounter: Payer: Self-pay | Admitting: Family Medicine

## 2013-12-20 ENCOUNTER — Ambulatory Visit (INDEPENDENT_AMBULATORY_CARE_PROVIDER_SITE_OTHER): Payer: 59 | Admitting: Family Medicine

## 2013-12-20 VITALS — BP 132/94 | HR 80 | Temp 98.3°F | Ht 62.0 in

## 2013-12-20 DIAGNOSIS — J029 Acute pharyngitis, unspecified: Secondary | ICD-10-CM

## 2013-12-20 MED ORDER — AMOXICILLIN 875 MG PO TABS
875.0000 mg | ORAL_TABLET | Freq: Two times a day (BID) | ORAL | Status: DC
Start: 2013-12-20 — End: 2014-01-30

## 2013-12-20 NOTE — Progress Notes (Signed)
OFFICE NOTE  12/20/2013  CC: "swollen nodes"  HPI: Patient is a 28 y.o. Caucasian female who is here for ST and feeling of swollen neck glands started yesterday. Cough productive of yellow phlegm.  No sneezing, nasal congestion, ear complaints, or fever. Throat pain is diffuse.  Neck and back achy, feels tired/malaise. No meds have been tried.  ROS: no GI sx's, no rash.  No HA.  Pertinent PMH:  Past medical, surgical, social, and family history reviewed.  MEDS:  OCP daily  PE: Blood pressure 132/94, pulse 80, temperature 98.3 F (36.8 C), temperature source Oral, height 5\' 2"  (1.575 m), SpO2 100.00%, currently breastfeeding. Gen: Alert, well appearing.  Patient is oriented to person, place, time, and situation. ENT: Ears: EACs clear, normal epithelium.  TMs with good light reflex and landmarks bilaterally.  Eyes: no injection, icteris, swelling, or exudate.  EOMI, PERRLA. Nose: no drainage or turbinate edema/swelling.  No injection or focal lesion.  Mouth: lips without lesion/swelling.  Oral mucosa pink and moist.  Dentition intact and without obvious caries or gingival swelling.  Oropharynx with generalized injection/ erythema, without exudate or swelling.   Neck: mildly tender bilat jugulodigastric lymph node swelling. CV: RRR, no m/r/g.   LUNGS: CTA bilat, nonlabored resps, good aeration in all lung fields. EXT: no clubbing, cyanosis, or edema.  Skin - no sores or suspicious lesions or rashes or color changes  IMPRESSION AND PLAN:  Acute pharyngitis, strep negative. However, she is early in the course of the illness.  Will send clx and start amoxil 875 mg bid going into long weekend, stop if clx comes back neg. Symptomatic care discussed.  An After Visit Summary was printed and given to the patient.  FOLLOW UP: prn

## 2013-12-20 NOTE — Progress Notes (Signed)
Pre visit review using our clinic review tool, if applicable. No additional management support is needed unless otherwise documented below in the visit note. 

## 2013-12-23 LAB — CULTURE, GROUP A STREP

## 2014-01-30 ENCOUNTER — Encounter: Payer: Self-pay | Admitting: Family Medicine

## 2014-01-30 ENCOUNTER — Ambulatory Visit (INDEPENDENT_AMBULATORY_CARE_PROVIDER_SITE_OTHER): Payer: 59 | Admitting: Family Medicine

## 2014-01-30 VITALS — BP 114/82 | HR 67 | Ht 62.0 in | Wt 140.0 lb

## 2014-01-30 DIAGNOSIS — M24559 Contracture, unspecified hip: Secondary | ICD-10-CM

## 2014-01-30 DIAGNOSIS — M999 Biomechanical lesion, unspecified: Secondary | ICD-10-CM

## 2014-01-30 DIAGNOSIS — M6240 Contracture of muscle, unspecified site: Secondary | ICD-10-CM

## 2014-01-30 NOTE — Progress Notes (Signed)
  [name] [address] [place], [place] [address] Phone: [phone] Subjective:      CC: Low back pain  UTM:[id number] Page Priscilla [name] is a 28 y.o. female coming in with complaint of low back pain. Patient has had this pain over the course last month since patient started running a more frequent basis. Patient states that it didn't hurt while she runs but it hurts more when she does certain activities after the running. Patient states if she sits for long amount of time and tries to get up she has some dull aching pain that can be sharp symptoms. Denies any radiation or any numbness. Patient states though that sometimes he can catch her breath. Denies any bowel or bladder changes denies any fevers or chills or any abnormal weight loss. Patient has tried some ibuprofen with mild improvement. Patient states that it makes it difficult to do her daily activities secondary to his pain such as lifting her 97-month-old son. Denies any nighttime awakening. At the severity of 6/10.     Past medical history, social, surgical and family history all reviewed in electronic medical record.   Review of Systems: No headache, visual changes, nausea, vomiting, diarrhea, constipation, dizziness, abdominal pain, skin rash, fevers, chills, night sweats, weight loss, swollen lymph nodes, body aches, joint swelling, muscle aches, chest pain, shortness of breath, mood changes.   Objective Blood pressure 114/82, pulse 67, height 5\' 2"  (1.575 m), weight 140 lb (63.504 kg), SpO2 98.00%, currently breastfeeding.  General: No apparent distress alert and oriented x3 mood and affect normal, dressed appropriately.  HEENT: Pupils equal, extraocular movements intact  Respiratory: Patient's speak in full sentences and does not appear short of breath  Cardiovascular: No lower extremity edema, non tender, no erythema  Skin: Warm dry intact with no signs of infection or rash on extremities or on  axial skeleton.  Abdomen: Soft nontender  Neuro: Cranial nerves II through XII are intact, neurovascularly intact in all extremities with 2+ DTRs and 2+ pulses.  Lymph: No lymphadenopathy of posterior or anterior cervical chain or axillae bilaterally.  Gait normal with good balance and coordination.  MSK:  Non tender with full range of motion and good stability and symmetric strength and tone of shoulders, elbows, wrist, hip, knee and ankles bilaterally.  Back Exam:  Inspection: Unremarkable  Motion: Flexion 45 deg, Extension 45 deg, Side Bending to 45 deg bilaterally,  Rotation to 45 deg bilaterally  SLR laying: Negative  XSLR laying: Negative  Palpable tenderness: Mild tenderness to palpation over the paraspinal musculature of the lumbar spine bilaterally mostly over the L2-L3 area. FABER: negative. Sensory change: Gross sensation intact to all lumbar and sacral dermatomes.  Reflexes: 2+ at both patellar tendons, 2+ at achilles tendons, Babinski's downgoing.  Strength at foot  Plantar-flexion: 5/5 Dorsi-flexion: 5/5 Eversion: 5/5 Inversion: 5/5  Leg strength  Quad: 5/5 Hamstring: 5/5 Hip flexor: 4/5 Hip abductors: 4/5  Gait unremarkable.  OMT Physical Exam  Standing flexion right  Seated Flexion right  Cervical  C2 F RS right  C6 F RS left  Thoracic  Elevated first rib  Sacrum left on left  Very tight hip flexors bilaterally   Impression and Recommendations:     This case required medical decision making of moderate complexity.

## 2014-01-30 NOTE — Assessment & Plan Note (Signed)
Decision today to treat with OMT was based on Physical Exam  After verbal consent patient was treated with HVLA and ME techniques in cervical, thoracic, lumbar and sacral areas  Patient tolerated the procedure well with improvement in symptoms  Patient given exercises, stretches and lifestyle modifications  See medications in patient instructions if given  Patient will follow up in 3 weeks

## 2014-01-30 NOTE — Patient Instructions (Signed)
Good to see you After running one knee down, one up tilt pelvis forward you should feel in groin and back.  Hold 10 seconds, repeat and do both sides.  After it get easy then put arms above head and rotate to leg that is up.  Look at handout and do these 3 times a week.  Take medicine. 1 pill 3 times a day for 3 days.  Come back in 3 weeks or so.

## 2014-01-30 NOTE — Assessment & Plan Note (Signed)
Patient does have tight hip flexors after running. We discussed changing her running gait somewhat as well as doing stretching afterwards. Patient was given handout and we did show her proper technique. We discussed over-the-counter medications he can be beneficial as well as an icing protocol. Patient will try these interventions and come back again in 2-3 weeks.

## 2014-02-18 ENCOUNTER — Ambulatory Visit: Payer: 59 | Admitting: Family Medicine

## 2014-02-20 ENCOUNTER — Ambulatory Visit (INDEPENDENT_AMBULATORY_CARE_PROVIDER_SITE_OTHER): Payer: 59 | Admitting: Family Medicine

## 2014-02-20 ENCOUNTER — Encounter: Payer: Self-pay | Admitting: Family Medicine

## 2014-02-20 VITALS — BP 122/78 | HR 87 | Ht 62.0 in | Wt 140.0 lb

## 2014-02-20 DIAGNOSIS — M6240 Contracture of muscle, unspecified site: Secondary | ICD-10-CM

## 2014-02-20 DIAGNOSIS — M999 Biomechanical lesion, unspecified: Secondary | ICD-10-CM

## 2014-02-20 DIAGNOSIS — R293 Abnormal posture: Secondary | ICD-10-CM

## 2014-02-20 DIAGNOSIS — M24559 Contracture, unspecified hip: Secondary | ICD-10-CM

## 2014-02-20 DIAGNOSIS — M799 Soft tissue disorder, unspecified: Secondary | ICD-10-CM

## 2014-02-20 NOTE — Patient Instructions (Signed)
Good to see you as always.  Tennis ball duct tape to chair between shoulder blades.  On wall with heels, butt shoulders and head touchgin  For goal of 5 minutes daily.  Otherwise continue what you are doing.  Lets say 5-6 weeks.

## 2014-02-20 NOTE — Assessment & Plan Note (Signed)
Patient will continue to work on exercises. I do think patient's posture also plays a role. Patient will continue these exercises as well as the icing protocol was given to her previously. Patient is doing very well but we will decrease the frequency of visits and treatments and come back again in 5-6 weeks for further evaluation and treatment.

## 2014-02-20 NOTE — Assessment & Plan Note (Signed)
Decision today to treat with OMT was based on Physical Exam  After verbal consent patient was treated with HVLA and ME techniques in cervical, thoracic, lumbar and sacral areas  Patient tolerated the procedure well with improvement in symptoms  Patient given exercises, stretches and lifestyle modifications  See medications in patient instructions if given  Patient will follow up in 5-6 weeks

## 2014-02-20 NOTE — Progress Notes (Signed)
  [name] [place], [place] [address] Phone: [phone] Subjective:      CC: Low back pain followup  URK:[id number] Priscilla [name] is a 28 y.o. female coming in with complaint of low back pain. Patient was previously seen in did have tight hip flexors. Patient was given exercises that she states has helped out significantly. In addition this patient states that the osteopathic manipulation was very helpful and states that she is about 80% better. Still has a mild stress in her neck but overall is feeling much better.     Past medical history, social, surgical and family history all reviewed in electronic medical record.   Review of Systems: No headache, visual changes, nausea, vomiting, diarrhea, constipation, dizziness, abdominal pain, skin rash, fevers, chills, night sweats, weight loss, swollen lymph nodes, body aches, joint swelling, muscle aches, chest pain, shortness of breath, mood changes.   Objective Blood pressure 122/78, pulse 87, height 5\' 2"  (1.575 m), weight 140 lb (63.504 kg), SpO2 96.00%, currently breastfeeding.  General: No apparent distress alert and oriented x3 mood and affect normal, dressed appropriately.  HEENT: Pupils equal, extraocular movements intact  Respiratory: Patient's speak in full sentences and does not appear short of breath  Cardiovascular: No lower extremity edema, non tender, no erythema  Skin: Warm dry intact with no signs of infection or rash on extremities or on axial skeleton.  Abdomen: Soft nontender  Neuro: Cranial nerves II through XII are intact, neurovascularly intact in all extremities with 2+ DTRs and 2+ pulses.  Lymph: No lymphadenopathy of posterior or anterior cervical chain or axillae bilaterally.  Gait normal with good balance and coordination.  MSK:  Non tender with full range of motion and good stability and symmetric strength and tone of shoulders, elbows, wrist, hip, knee and ankles  bilaterally.  Back Exam:  Inspection: Unremarkable  Motion: Flexion 45 deg, Extension 45 deg, Side Bending to 45 deg bilaterally,  Rotation to 45 deg bilaterally  SLR laying: Negative  XSLR laying: Negative  Palpable tenderness: Mild tenderness to palpation over the paraspinal musculature of the lumbar spine bilaterally mostly over the L2-L3 area. FABER: negative. Sensory change: Gross sensation intact to all lumbar and sacral dermatomes.  Reflexes: 2+ at both patellar tendons, 2+ at achilles tendons, Babinski's downgoing.  Strength at foot  Plantar-flexion: 5/5 Dorsi-flexion: 5/5 Eversion: 5/5 Inversion: 5/5  Leg strength  Quad: 5/5 Hamstring: 5/5 Hip flexor: 4/5 Hip abductors: 4/5  Gait unremarkable.  OMT Physical Exam  Standing flexion right  Seated Flexion right  Cervical  C2 F RS right  C6 F RS left  Thoracic  T3 extended rotated inside that right Sacrum left on left  Mild improvement in patient's tight hip flexors   Impression and Recommendations:     This case required medical decision making of moderate complexity.

## 2014-02-20 NOTE — Assessment & Plan Note (Signed)
Patient was given multiple different postural exercises that can help during working throughout the day that I think will be beneficial. We discussed the ergonomic work in position they can be helpful. Patient will come back and see me again in 5-6 weeks for further evaluation.

## 2014-06-02 ENCOUNTER — Encounter: Payer: Self-pay | Admitting: Family Medicine

## 2014-07-23 ENCOUNTER — Other Ambulatory Visit (INDEPENDENT_AMBULATORY_CARE_PROVIDER_SITE_OTHER): Payer: 59

## 2014-07-23 ENCOUNTER — Encounter: Payer: Self-pay | Admitting: Internal Medicine

## 2014-07-23 ENCOUNTER — Ambulatory Visit (INDEPENDENT_AMBULATORY_CARE_PROVIDER_SITE_OTHER): Payer: 59 | Admitting: Internal Medicine

## 2014-07-23 VITALS — BP 112/78 | HR 80 | Temp 98.0°F | Resp 12 | Ht 62.0 in | Wt 137.2 lb

## 2014-07-23 DIAGNOSIS — Z0000 Encounter for general adult medical examination without abnormal findings: Secondary | ICD-10-CM

## 2014-07-23 LAB — COMPREHENSIVE METABOLIC PANEL
ALK PHOS: 47 U/L (ref 39–117)
ALT: 14 U/L (ref 0–35)
AST: 20 U/L (ref 0–37)
Albumin: 4.4 g/dL (ref 3.5–5.2)
BUN: 12 mg/dL (ref 6–23)
CO2: 24 mEq/L (ref 19–32)
CREATININE: 0.8 mg/dL (ref 0.4–1.2)
Calcium: 9.8 mg/dL (ref 8.4–10.5)
Chloride: 105 mEq/L (ref 96–112)
GFR: 90.4 mL/min (ref >60.00)
GLUCOSE: 106 mg/dL — AB (ref 70–99)
Potassium: 4.1 mEq/L (ref 3.5–5.1)
Sodium: 137 mEq/L (ref 135–145)
Total Bilirubin: 0.5 mg/dL (ref 0.2–1.2)
Total Protein: 7.6 g/dL (ref 6.0–8.3)

## 2014-07-23 LAB — CBC WITH DIFFERENTIAL/PLATELET
BASOS PCT: 0.3 % (ref 0.0–3.0)
Basophils Absolute: 0 10*3/uL (ref 0.0–0.1)
EOS ABS: 0.1 10*3/uL (ref 0.0–0.7)
Eosinophils Relative: 0.6 % (ref 0.0–5.0)
HCT: 42.7 % (ref 36.0–46.0)
HEMOGLOBIN: 14.1 g/dL (ref 12.0–15.0)
LYMPHS PCT: 26 % (ref 12.0–46.0)
Lymphs Abs: 2.1 10*3/uL (ref 0.7–4.0)
MCHC: 32.9 g/dL (ref 30.0–36.0)
MCV: 90.1 fl (ref 78.0–100.0)
MONO ABS: 0.6 10*3/uL (ref 0.1–1.0)
Monocytes Relative: 7 % (ref 3.0–12.0)
NEUTROS ABS: 5.4 10*3/uL (ref 1.4–7.7)
Neutrophils Relative %: 66.1 % (ref 43.0–77.0)
Platelets: 339 10*3/uL (ref 150.0–400.0)
RBC: 4.74 Mil/uL (ref 3.87–5.11)
RDW: 13.4 % (ref 11.5–15.5)
WBC: 8.2 10*3/uL (ref 4.0–10.5)

## 2014-07-23 LAB — LIPID PANEL
CHOL/HDL RATIO: 3
CHOLESTEROL: 212 mg/dL — AB (ref 0–200)
HDL: 66.1 mg/dL (ref >39.00)
LDL Cholesterol: 122 mg/dL — ABNORMAL HIGH (ref 0–99)
NonHDL: 145.9
Triglycerides: 120 mg/dL (ref 0.0–149.0)
VLDL: 24 mg/dL (ref 0.0–40.0)

## 2014-07-23 LAB — TSH: TSH: 1.33 u[IU]/mL (ref 0.35–4.50)

## 2014-07-23 NOTE — Patient Instructions (Signed)
Preventive Care for Adults A healthy lifestyle and preventive care can promote health and wellness. Preventive health guidelines for women include the following key practices.  A routine yearly physical is a good way to check with your health care provider about your health and preventive screening. It is a chance to share any concerns and updates on your health and to receive a thorough exam.  Visit your dentist for a routine exam and preventive care every 6 months. Brush your teeth twice a day and floss once a day. Good oral hygiene prevents tooth decay and gum disease.  The frequency of eye exams is based on your age, health, family medical history, use of contact lenses, and other factors. Follow your health care provider's recommendations for frequency of eye exams.  Eat a healthy diet. Foods like vegetables, fruits, whole grains, low-fat dairy products, and lean protein foods contain the nutrients you need without too many calories. Decrease your intake of foods high in solid fats, added sugars, and salt. Eat the right amount of calories for you.Get information about a proper diet from your health care provider, if necessary.  Regular physical exercise is one of the most important things you can do for your health. Most adults should get at least 150 minutes of moderate-intensity exercise (any activity that increases your heart rate and causes you to sweat) each week. In addition, most adults need muscle-strengthening exercises on 2 or more days a week.  Maintain a healthy weight. The body mass index (BMI) is a screening tool to identify possible weight problems. It provides an estimate of body fat based on height and weight. Your health care provider can find your BMI and can help you achieve or maintain a healthy weight.For adults 20 years and older:  A BMI below 18.5 is considered underweight.  A BMI of 18.5 to 24.9 is normal.  A BMI of 25 to 29.9 is considered overweight.  A BMI of  30 and above is considered obese.  Maintain normal blood lipids and cholesterol levels by exercising and minimizing your intake of saturated fat. Eat a balanced diet with plenty of fruit and vegetables. Blood tests for lipids and cholesterol should begin at age 76 and be repeated every 5 years. If your lipid or cholesterol levels are high, you are over 50, or you are at high risk for heart disease, you may need your cholesterol levels checked more frequently.Ongoing high lipid and cholesterol levels should be treated with medicines if diet and exercise are not working.  If you smoke, find out from your health care provider how to quit. If you do not use tobacco, do not start.  Lung cancer screening is recommended for adults aged 22-80 years who are at high risk for developing lung cancer because of a history of smoking. A yearly low-dose CT scan of the lungs is recommended for people who have at least a 30-pack-year history of smoking and are a current smoker or have quit within the past 15 years. A pack year of smoking is smoking an average of 1 pack of cigarettes a day for 1 year (for example: 1 pack a day for 30 years or 2 packs a day for 15 years). Yearly screening should continue until the smoker has stopped smoking for at least 15 years. Yearly screening should be stopped for people who develop a health problem that would prevent them from having lung cancer treatment.  If you are pregnant, do not drink alcohol. If you are breastfeeding,  be very cautious about drinking alcohol. If you are not pregnant and choose to drink alcohol, do not have more than 1 drink per day. One drink is considered to be 12 ounces (355 mL) of beer, 5 ounces (148 mL) of wine, or 1.5 ounces (44 mL) of liquor.  Avoid use of street drugs. Do not share needles with anyone. Ask for help if you need support or instructions about stopping the use of drugs.  High blood pressure causes heart disease and increases the risk of  stroke. Your blood pressure should be checked at least every 1 to 2 years. Ongoing high blood pressure should be treated with medicines if weight loss and exercise do not work.  If you are 75-52 years old, ask your health care provider if you should take aspirin to prevent strokes.  Diabetes screening involves taking a blood sample to check your fasting blood sugar level. This should be done once every 3 years, after age 15, if you are within normal weight and without risk factors for diabetes. Testing should be considered at a younger age or be carried out more frequently if you are overweight and have at least 1 risk factor for diabetes.  Breast cancer screening is essential preventive care for women. You should practice "breast self-awareness." This means understanding the normal appearance and feel of your breasts and may include breast self-examination. Any changes detected, no matter how small, should be reported to a health care provider. Women in their 58s and 30s should have a clinical breast exam (CBE) by a health care provider as part of a regular health exam every 1 to 3 years. After age 16, women should have a CBE every year. Starting at age 53, women should consider having a mammogram (breast X-ray test) every year. Women who have a family history of breast cancer should talk to their health care provider about genetic screening. Women at a high risk of breast cancer should talk to their health care providers about having an MRI and a mammogram every year.  Breast cancer gene (BRCA)-related cancer risk assessment is recommended for women who have family members with BRCA-related cancers. BRCA-related cancers include breast, ovarian, tubal, and peritoneal cancers. Having family members with these cancers may be associated with an increased risk for harmful changes (mutations) in the breast cancer genes BRCA1 and BRCA2. Results of the assessment will determine the need for genetic counseling and  BRCA1 and BRCA2 testing.  Routine pelvic exams to screen for cancer are no longer recommended for nonpregnant women who are considered low risk for cancer of the pelvic organs (ovaries, uterus, and vagina) and who do not have symptoms. Ask your health care provider if a screening pelvic exam is right for you.  If you have had past treatment for cervical cancer or a condition that could lead to cancer, you need Pap tests and screening for cancer for at least 20 years after your treatment. If Pap tests have been discontinued, your risk factors (such as having a new sexual partner) need to be reassessed to determine if screening should be resumed. Some women have medical problems that increase the chance of getting cervical cancer. In these cases, your health care provider may recommend more frequent screening and Pap tests.  The HPV test is an additional test that may be used for cervical cancer screening. The HPV test looks for the virus that can cause the cell changes on the cervix. The cells collected during the Pap test can be  tested for HPV. The HPV test could be used to screen women aged 30 years and older, and should be used in women of any age who have unclear Pap test results. After the age of 30, women should have HPV testing at the same frequency as a Pap test.  Colorectal cancer can be detected and often prevented. Most routine colorectal cancer screening begins at the age of 50 years and continues through age 75 years. However, your health care provider may recommend screening at an earlier age if you have risk factors for colon cancer. On a yearly basis, your health care provider may provide home test kits to check for hidden blood in the stool. Use of a small camera at the end of a tube, to directly examine the colon (sigmoidoscopy or colonoscopy), can detect the earliest forms of colorectal cancer. Talk to your health care provider about this at age 50, when routine screening begins. Direct  exam of the colon should be repeated every 5-10 years through age 75 years, unless early forms of pre-cancerous polyps or small growths are found.  People who are at an increased risk for hepatitis B should be screened for this virus. You are considered at high risk for hepatitis B if:  You were born in a country where hepatitis B occurs often. Talk with your health care provider about which countries are considered high risk.  Your parents were born in a high-risk country and you have not received a shot to protect against hepatitis B (hepatitis B vaccine).  You have HIV or AIDS.  You use needles to inject street drugs.  You live with, or have sex with, someone who has hepatitis B.  You get hemodialysis treatment.  You take certain medicines for conditions like cancer, organ transplantation, and autoimmune conditions.  Hepatitis C blood testing is recommended for all people born from 1945 through 1965 and any individual with known risks for hepatitis C.  Practice safe sex. Use condoms and avoid high-risk sexual practices to reduce the spread of sexually transmitted infections (STIs). STIs include gonorrhea, chlamydia, syphilis, trichomonas, herpes, HPV, and human immunodeficiency virus (HIV). Herpes, HIV, and HPV are viral illnesses that have no cure. They can result in disability, cancer, and death.  You should be screened for sexually transmitted illnesses (STIs) including gonorrhea and chlamydia if:  You are sexually active and are younger than 24 years.  You are older than 24 years and your health care provider tells you that you are at risk for this type of infection.  Your sexual activity has changed since you were last screened and you are at an increased risk for chlamydia or gonorrhea. Ask your health care provider if you are at risk.  If you are at risk of being infected with HIV, it is recommended that you take a prescription medicine daily to prevent HIV infection. This is  called preexposure prophylaxis (PrEP). You are considered at risk if:  You are a heterosexual woman, are sexually active, and are at increased risk for HIV infection.  You take drugs by injection.  You are sexually active with a partner who has HIV.  Talk with your health care provider about whether you are at high risk of being infected with HIV. If you choose to begin PrEP, you should first be tested for HIV. You should then be tested every 3 months for as long as you are taking PrEP.  Osteoporosis is a disease in which the bones lose minerals and strength   with aging. This can result in serious bone fractures or breaks. The risk of osteoporosis can be identified using a bone density scan. Women ages 65 years and over and women at risk for fractures or osteoporosis should discuss screening with their health care providers. Ask your health care provider whether you should take a calcium supplement or vitamin D to reduce the rate of osteoporosis.  Menopause can be associated with physical symptoms and risks. Hormone replacement therapy is available to decrease symptoms and risks. You should talk to your health care provider about whether hormone replacement therapy is right for you.  Use sunscreen. Apply sunscreen liberally and repeatedly throughout the day. You should seek shade when your shadow is shorter than you. Protect yourself by wearing long sleeves, pants, a wide-brimmed hat, and sunglasses year round, whenever you are outdoors.  Once a month, do a whole body skin exam, using a mirror to look at the skin on your back. Tell your health care provider of new moles, moles that have irregular borders, moles that are larger than a pencil eraser, or moles that have changed in shape or color.  Stay current with required vaccines (immunizations).  Influenza vaccine. All adults should be immunized every year.  Tetanus, diphtheria, and acellular pertussis (Td, Tdap) vaccine. Pregnant women should  receive 1 dose of Tdap vaccine during each pregnancy. The dose should be obtained regardless of the length of time since the last dose. Immunization is preferred during the 27th-36th week of gestation. An adult who has not previously received Tdap or who does not know her vaccine status should receive 1 dose of Tdap. This initial dose should be followed by tetanus and diphtheria toxoids (Td) booster doses every 10 years. Adults with an unknown or incomplete history of completing a 3-dose immunization series with Td-containing vaccines should begin or complete a primary immunization series including a Tdap dose. Adults should receive a Td booster every 10 years.  Varicella vaccine. An adult without evidence of immunity to varicella should receive 2 doses or a second dose if she has previously received 1 dose. Pregnant females who do not have evidence of immunity should receive the first dose after pregnancy. This first dose should be obtained before leaving the health care facility. The second dose should be obtained 4-8 weeks after the first dose.  Human papillomavirus (HPV) vaccine. Females aged 13-26 years who have not received the vaccine previously should obtain the 3-dose series. The vaccine is not recommended for use in pregnant females. However, pregnancy testing is not needed before receiving a dose. If a female is found to be pregnant after receiving a dose, no treatment is needed. In that case, the remaining doses should be delayed until after the pregnancy. Immunization is recommended for any person with an immunocompromised condition through the age of 26 years if she did not get any or all doses earlier. During the 3-dose series, the second dose should be obtained 4-8 weeks after the first dose. The third dose should be obtained 24 weeks after the first dose and 16 weeks after the second dose.  Zoster vaccine. One dose is recommended for adults aged 60 years or older unless certain conditions are  present.  Measles, mumps, and rubella (MMR) vaccine. Adults born before 1957 generally are considered immune to measles and mumps. Adults born in 1957 or later should have 1 or more doses of MMR vaccine unless there is a contraindication to the vaccine or there is laboratory evidence of immunity to   each of the three diseases. A routine second dose of MMR vaccine should be obtained at least 28 days after the first dose for students attending postsecondary schools, health care workers, or international travelers. People who received inactivated measles vaccine or an unknown type of measles vaccine during 1963-1967 should receive 2 doses of MMR vaccine. People who received inactivated mumps vaccine or an unknown type of mumps vaccine before 1979 and are at high risk for mumps infection should consider immunization with 2 doses of MMR vaccine. For females of childbearing age, rubella immunity should be determined. If there is no evidence of immunity, females who are not pregnant should be vaccinated. If there is no evidence of immunity, females who are pregnant should delay immunization until after pregnancy. Unvaccinated health care workers born before 1957 who lack laboratory evidence of measles, mumps, or rubella immunity or laboratory confirmation of disease should consider measles and mumps immunization with 2 doses of MMR vaccine or rubella immunization with 1 dose of MMR vaccine.  Pneumococcal 13-valent conjugate (PCV13) vaccine. When indicated, a person who is uncertain of her immunization history and has no record of immunization should receive the PCV13 vaccine. An adult aged 19 years or older who has certain medical conditions and has not been previously immunized should receive 1 dose of PCV13 vaccine. This PCV13 should be followed with a dose of pneumococcal polysaccharide (PPSV23) vaccine. The PPSV23 vaccine dose should be obtained at least 8 weeks after the dose of PCV13 vaccine. An adult aged 19  years or older who has certain medical conditions and previously received 1 or more doses of PPSV23 vaccine should receive 1 dose of PCV13. The PCV13 vaccine dose should be obtained 1 or more years after the last PPSV23 vaccine dose.  Pneumococcal polysaccharide (PPSV23) vaccine. When PCV13 is also indicated, PCV13 should be obtained first. All adults aged 65 years and older should be immunized. An adult younger than age 65 years who has certain medical conditions should be immunized. Any person who resides in a nursing home or long-term care facility should be immunized. An adult smoker should be immunized. People with an immunocompromised condition and certain other conditions should receive both PCV13 and PPSV23 vaccines. People with human immunodeficiency virus (HIV) infection should be immunized as soon as possible after diagnosis. Immunization during chemotherapy or radiation therapy should be avoided. Routine use of PPSV23 vaccine is not recommended for American Indians, [place] Natives, or people younger than 65 years unless there are medical conditions that require PPSV23 vaccine. When indicated, people who have unknown immunization and have no record of immunization should receive PPSV23 vaccine. One-time revaccination 5 years after the first dose of PPSV23 is recommended for people aged 19-64 years who have chronic kidney failure, nephrotic syndrome, asplenia, or immunocompromised conditions. People who received 1-2 doses of PPSV23 before age 65 years should receive another dose of PPSV23 vaccine at age 65 years or later if at least 5 years have passed since the previous dose. Doses of PPSV23 are not needed for people immunized with PPSV23 at or after age 65 years.  Meningococcal vaccine. Adults with asplenia or persistent complement component deficiencies should receive 2 doses of quadrivalent meningococcal conjugate (MenACWY-D) vaccine. The doses should be obtained at least 2 months apart.  Microbiologists working with certain meningococcal bacteria, military recruits, people at risk during an outbreak, and people who travel to or live in countries with a high rate of meningitis should be immunized. A first-year college student up through age   21 years who is living in a residence hall should receive a dose if she did not receive a dose on or after her 16th birthday. Adults who have certain high-risk conditions should receive one or more doses of vaccine.  Hepatitis A vaccine. Adults who wish to be protected from this disease, have certain high-risk conditions, work with hepatitis A-infected animals, work in hepatitis A research labs, or travel to or work in countries with a high rate of hepatitis A should be immunized. Adults who were previously unvaccinated and who anticipate close contact with an international adoptee during the first 60 days after arrival in the [place] from a country with a high rate of hepatitis A should be immunized.  Hepatitis B vaccine. Adults who wish to be protected from this disease, have certain high-risk conditions, may be exposed to blood or other infectious body fluids, are household contacts or sex partners of hepatitis B positive people, are clients or workers in certain care facilities, or travel to or work in countries with a high rate of hepatitis B should be immunized.  Haemophilus influenzae type b (Hib) vaccine. A previously unvaccinated person with asplenia or sickle cell disease or having a scheduled splenectomy should receive 1 dose of Hib vaccine. Regardless of previous immunization, a recipient of a hematopoietic stem cell transplant should receive a 3-dose series 6-12 months after her successful transplant. Hib vaccine is not recommended for adults with HIV infection. Preventive Services / Frequency Ages 64 to 68 years  Blood pressure check.** / Every 1 to 2 years.  Lipid and cholesterol check.** / Every 5 years beginning at age  22.  Clinical breast exam.** / Every 3 years for women in their 88s and 53s.  BRCA-related cancer risk assessment.** / For women who have family members with a BRCA-related cancer (breast, ovarian, tubal, or peritoneal cancers).  Pap test.** / Every 2 years from ages 90 through 51. Every 3 years starting at age 21 through age 56 or 3 with a history of 3 consecutive normal Pap tests.  HPV screening.** / Every 3 years from ages 24 through ages 1 to 46 with a history of 3 consecutive normal Pap tests.  Hepatitis C blood test.** / For any individual with known risks for hepatitis C.  Skin self-exam. / Monthly.  Influenza vaccine. / Every year.  Tetanus, diphtheria, and acellular pertussis (Tdap, Td) vaccine.** / Consult your health care provider. Pregnant women should receive 1 dose of Tdap vaccine during each pregnancy. 1 dose of Td every 10 years.  Varicella vaccine.** / Consult your health care provider. Pregnant females who do not have evidence of immunity should receive the first dose after pregnancy.  HPV vaccine. / 3 doses over 6 months, if 72 and younger. The vaccine is not recommended for use in pregnant females. However, pregnancy testing is not needed before receiving a dose.  Measles, mumps, rubella (MMR) vaccine.** / You need at least 1 dose of MMR if you were born in 1957 or later. You may also need a 2nd dose. For females of childbearing age, rubella immunity should be determined. If there is no evidence of immunity, females who are not pregnant should be vaccinated. If there is no evidence of immunity, females who are pregnant should delay immunization until after pregnancy.  Pneumococcal 13-valent conjugate (PCV13) vaccine.** / Consult your health care provider.  Pneumococcal polysaccharide (PPSV23) vaccine.** / 1 to 2 doses if you smoke cigarettes or if you have certain conditions.  Meningococcal vaccine.** /  1 dose if you are age 19 to 21 years and a first-year college  student living in a residence hall, or have one of several medical conditions, you need to get vaccinated against meningococcal disease. You may also need additional booster doses.  Hepatitis A vaccine.** / Consult your health care provider.  Hepatitis B vaccine.** / Consult your health care provider.  Haemophilus influenzae type b (Hib) vaccine.** / Consult your health care provider. Ages 40 to 64 years  Blood pressure check.** / Every 1 to 2 years.  Lipid and cholesterol check.** / Every 5 years beginning at age 20 years.  Lung cancer screening. / Every year if you are aged 55-80 years and have a 30-pack-year history of smoking and currently smoke or have quit within the past 15 years. Yearly screening is stopped once you have quit smoking for at least 15 years or develop a health problem that would prevent you from having lung cancer treatment.  Clinical breast exam.** / Every year after age 40 years.  BRCA-related cancer risk assessment.** / For women who have family members with a BRCA-related cancer (breast, ovarian, tubal, or peritoneal cancers).  Mammogram.** / Every year beginning at age 40 years and continuing for as long as you are in good health. Consult with your health care provider.  Pap test.** / Every 3 years starting at age 30 years through age 65 or 70 years with a history of 3 consecutive normal Pap tests.  HPV screening.** / Every 3 years from ages 30 years through ages 65 to 70 years with a history of 3 consecutive normal Pap tests.  Fecal occult blood test (FOBT) of stool. / Every year beginning at age 50 years and continuing until age 75 years. You may not need to do this test if you get a colonoscopy every 10 years.  Flexible sigmoidoscopy or colonoscopy.** / Every 5 years for a flexible sigmoidoscopy or every 10 years for a colonoscopy beginning at age 50 years and continuing until age 75 years.  Hepatitis C blood test.** / For all people born from 1945 through  1965 and any individual with known risks for hepatitis C.  Skin self-exam. / Monthly.  Influenza vaccine. / Every year.  Tetanus, diphtheria, and acellular pertussis (Tdap/Td) vaccine.** / Consult your health care provider. Pregnant women should receive 1 dose of Tdap vaccine during each pregnancy. 1 dose of Td every 10 years.  Varicella vaccine.** / Consult your health care provider. Pregnant females who do not have evidence of immunity should receive the first dose after pregnancy.  Zoster vaccine.** / 1 dose for adults aged 60 years or older.  Measles, mumps, rubella (MMR) vaccine.** / You need at least 1 dose of MMR if you were born in 1957 or later. You may also need a 2nd dose. For females of childbearing age, rubella immunity should be determined. If there is no evidence of immunity, females who are not pregnant should be vaccinated. If there is no evidence of immunity, females who are pregnant should delay immunization until after pregnancy.  Pneumococcal 13-valent conjugate (PCV13) vaccine.** / Consult your health care provider.  Pneumococcal polysaccharide (PPSV23) vaccine.** / 1 to 2 doses if you smoke cigarettes or if you have certain conditions.  Meningococcal vaccine.** / Consult your health care provider.  Hepatitis A vaccine.** / Consult your health care provider.  Hepatitis B vaccine.** / Consult your health care provider.  Haemophilus influenzae type b (Hib) vaccine.** / Consult your health care provider. Ages 65   years and over  Blood pressure check.** / Every 1 to 2 years.  Lipid and cholesterol check.** / Every 5 years beginning at age 22 years.  Lung cancer screening. / Every year if you are aged 73-80 years and have a 30-pack-year history of smoking and currently smoke or have quit within the past 15 years. Yearly screening is stopped once you have quit smoking for at least 15 years or develop a health problem that would prevent you from having lung cancer  treatment.  Clinical breast exam.** / Every year after age 4 years.  BRCA-related cancer risk assessment.** / For women who have family members with a BRCA-related cancer (breast, ovarian, tubal, or peritoneal cancers).  Mammogram.** / Every year beginning at age 40 years and continuing for as long as you are in good health. Consult with your health care provider.  Pap test.** / Every 3 years starting at age 9 years through age 34 or 91 years with 3 consecutive normal Pap tests. Testing can be stopped between 65 and 70 years with 3 consecutive normal Pap tests and no abnormal Pap or HPV tests in the past 10 years.  HPV screening.** / Every 3 years from ages 57 years through ages 64 or 45 years with a history of 3 consecutive normal Pap tests. Testing can be stopped between 65 and 70 years with 3 consecutive normal Pap tests and no abnormal Pap or HPV tests in the past 10 years.  Fecal occult blood test (FOBT) of stool. / Every year beginning at age 15 years and continuing until age 17 years. You may not need to do this test if you get a colonoscopy every 10 years.  Flexible sigmoidoscopy or colonoscopy.** / Every 5 years for a flexible sigmoidoscopy or every 10 years for a colonoscopy beginning at age 86 years and continuing until age 71 years.  Hepatitis C blood test.** / For all people born from [birth year] through 1965 and any individual with known risks for hepatitis C.  Osteoporosis screening.** / A one-time screening for women ages 83 years and over and women at risk for fractures or osteoporosis.  Skin self-exam. / Monthly.  Influenza vaccine. / Every year.  Tetanus, diphtheria, and acellular pertussis (Tdap/Td) vaccine.** / 1 dose of Td every 10 years.  Varicella vaccine.** / Consult your health care provider.  Zoster vaccine.** / 1 dose for adults aged 61 years or older.  Pneumococcal 13-valent conjugate (PCV13) vaccine.** / Consult your health care provider.  Pneumococcal  polysaccharide (PPSV23) vaccine.** / 1 dose for all adults aged 28 years and older.  Meningococcal vaccine.** / Consult your health care provider.  Hepatitis A vaccine.** / Consult your health care provider.  Hepatitis B vaccine.** / Consult your health care provider.  Haemophilus influenzae type b (Hib) vaccine.** / Consult your health care provider. ** Family history and personal history of risk and conditions may change your health care provider's recommendations. Document Released: 09/13/2001 Document Revised: 12/02/2013 Document Reviewed: 12/13/2010 [facility] Patient Information 2015 [place], [place]. This information is not intended to replace advice given to you by your health care provider. Make sure you discuss any questions you have with your health care provider.

## 2014-07-23 NOTE — Progress Notes (Signed)
Pre visit review using our clinic review tool, if applicable. No additional management support is needed unless otherwise documented below in the visit note. 

## 2014-07-23 NOTE — Assessment & Plan Note (Signed)
Vaccines were reviewed and updated Labs ordered Exam done Pt ed material was given 

## 2014-07-23 NOTE — Progress Notes (Signed)
   Subjective:    Patient ID: Priscilla [name], female    DOB: 1986/03/19, 28 y.o.   MRN: [id number]  HPI  She comes in for a physical, she feels well and offers no complains. She saw her GYN earlier this year for a physical as well.  Review of Systems  All other systems reviewed and are negative.      Objective:   Physical Exam  Constitutional: She is oriented to person, place, and time. She appears well-developed and well-nourished. No distress.  HENT:  Head: Normocephalic and atraumatic.  Mouth/Throat: Oropharynx is clear and moist. No oropharyngeal exudate.  Eyes: Conjunctivae are normal. Right eye exhibits no discharge. Left eye exhibits no discharge. No scleral icterus.  Neck: Normal range of motion. Neck supple. No JVD present. No tracheal deviation present. No thyromegaly present.  Cardiovascular: Normal rate, regular rhythm, normal heart sounds and intact distal pulses.  Exam reveals no gallop and no friction rub.   No murmur heard. Pulmonary/Chest: Effort normal and breath sounds normal. No stridor. No respiratory distress. She has no wheezes. She has no rales. She exhibits no tenderness.  Abdominal: Soft. Bowel sounds are normal. She exhibits no distension and no mass. There is no tenderness. There is no rebound and no guarding.  Musculoskeletal: Normal range of motion. She exhibits no edema or tenderness.  Lymphadenopathy:    She has no cervical adenopathy.  Neurological: She is oriented to person, place, and time.  Skin: Skin is warm and dry. No rash noted. She is not diaphoretic. No erythema. No pallor.  Psychiatric: She has a normal mood and affect. Her behavior is normal. Judgment and thought content normal.  Vitals reviewed.     Lab Results  Component Value Date   WBC 7.1 07/02/2013   HGB 13.6 07/02/2013   HCT 40.4 07/02/2013   PLT 314.0 07/02/2013   GLUCOSE 95 07/02/2013   CHOL 187 07/02/2013   TRIG 68.0 07/02/2013   HDL 56.60 07/02/2013   LDLCALC 117*  07/02/2013   ALT 13 07/02/2013   AST 16 07/02/2013   NA 138 07/02/2013   K 4.4 07/02/2013   CL 106 07/02/2013   CREATININE 0.7 07/02/2013   BUN 16 07/02/2013   CO2 26 07/02/2013   TSH 1.02 07/02/2013      Assessment & Plan:

## 2014-07-28 ENCOUNTER — Encounter: Payer: 59 | Admitting: Internal Medicine

## 2014-11-10 ENCOUNTER — Ambulatory Visit (INDEPENDENT_AMBULATORY_CARE_PROVIDER_SITE_OTHER): Payer: 59 | Admitting: Physician Assistant

## 2014-11-10 ENCOUNTER — Encounter: Payer: Self-pay | Admitting: Physician Assistant

## 2014-11-10 ENCOUNTER — Other Ambulatory Visit: Payer: Self-pay | Admitting: Physician Assistant

## 2014-11-10 VITALS — BP 113/76 | HR 72 | Temp 98.0°F | Resp 16 | Ht 62.0 in | Wt 133.0 lb

## 2014-11-10 DIAGNOSIS — J019 Acute sinusitis, unspecified: Secondary | ICD-10-CM | POA: Insufficient documentation

## 2014-11-10 DIAGNOSIS — B9689 Other specified bacterial agents as the cause of diseases classified elsewhere: Secondary | ICD-10-CM | POA: Insufficient documentation

## 2014-11-10 MED ORDER — AMOXICILLIN-POT CLAVULANATE 875-125 MG PO TABS: 1.0000 | ORAL_TABLET | Freq: Two times a day (BID) | ORAL | Status: DC

## 2014-11-10 MED ORDER — FLUCONAZOLE 150 MG PO TABS
ORAL_TABLET | ORAL | Status: DC
Start: 2014-11-10 — End: 2015-10-13

## 2014-11-10 NOTE — Patient Instructions (Signed)
Please take antibiotic as directed.  Increase fluid intake.  Use Saline nasal spray.  Take a daily multivitamin. Use Delsym for daytime cough.  Place a humidifier in the bedroom.  Please call or return clinic if symptoms are not improving.  Sinusitis Sinusitis is redness, soreness, and swelling (inflammation) of the paranasal sinuses. Paranasal sinuses are air pockets within the bones of your face (beneath the eyes, the middle of the forehead, or above the eyes). In healthy paranasal sinuses, mucus is able to drain out, and air is able to circulate through them by way of your nose. However, when your paranasal sinuses are inflamed, mucus and air can become trapped. This can allow bacteria and other germs to grow and cause infection. Sinusitis can develop quickly and last only a short time (acute) or continue over a long period (chronic). Sinusitis that lasts for more than 12 weeks is considered chronic.  CAUSES  Causes of sinusitis include:  Allergies.  Structural abnormalities, such as displacement of the cartilage that separates your nostrils (deviated septum), which can decrease the air flow through your nose and sinuses and affect sinus drainage.  Functional abnormalities, such as when the small hairs (cilia) that line your sinuses and help remove mucus do not work properly or are not present. SYMPTOMS  Symptoms of acute and chronic sinusitis are the same. The primary symptoms are pain and pressure around the affected sinuses. Other symptoms include:  Upper toothache.  Earache.  Headache.  Bad breath.  Decreased sense of smell and taste.  A cough, which worsens when you are lying flat.  Fatigue.  Fever.  Thick drainage from your nose, which often is green and may contain pus (purulent).  Swelling and warmth over the affected sinuses. DIAGNOSIS  Your caregiver will perform a physical exam. During the exam, your caregiver may:  Look in your nose for signs of abnormal growths  in your nostrils (nasal polyps).  Tap over the affected sinus to check for signs of infection.  View the inside of your sinuses (endoscopy) with a special imaging device with a light attached (endoscope), which is inserted into your sinuses. If your caregiver suspects that you have chronic sinusitis, one or more of the following tests may be recommended:  Allergy tests.  Nasal culture A sample of mucus is taken from your nose and sent to a lab and screened for bacteria.  Nasal cytology A sample of mucus is taken from your nose and examined by your caregiver to determine if your sinusitis is related to an allergy. TREATMENT  Most cases of acute sinusitis are related to a viral infection and will resolve on their own within 10 days. Sometimes medicines are prescribed to help relieve symptoms (pain medicine, decongestants, nasal steroid sprays, or saline sprays).  However, for sinusitis related to a bacterial infection, your caregiver will prescribe antibiotic medicines. These are medicines that will help kill the bacteria causing the infection.  Rarely, sinusitis is caused by a fungal infection. In theses cases, your caregiver will prescribe antifungal medicine. For some cases of chronic sinusitis, surgery is needed. Generally, these are cases in which sinusitis recurs more than 3 times per year, despite other treatments. HOME CARE INSTRUCTIONS   Drink plenty of water. Water helps thin the mucus so your sinuses can drain more easily.  Use a humidifier.  Inhale steam 3 to 4 times a day (for example, sit in the bathroom with the shower running).  Apply a warm, moist washcloth to your face 3  to 4 times a day, or as directed by your caregiver.  Use saline nasal sprays to help moisten and clean your sinuses.  Take over-the-counter or prescription medicines for pain, discomfort, or fever only as directed by your caregiver. SEEK IMMEDIATE MEDICAL CARE IF:  You have increasing pain or severe  headaches.  You have nausea, vomiting, or drowsiness.  You have swelling around your face.  You have vision problems.  You have a stiff neck.  You have difficulty breathing. MAKE SURE YOU:   Understand these instructions.  Will watch your condition.  Will get help right away if you are not doing well or get worse. Document Released: 07/18/2005 Document Revised: 10/10/2011 Document Reviewed: 08/02/2011 [facility] Patient Information 2014 [place], [place].

## 2014-11-10 NOTE — Assessment & Plan Note (Signed)
Rx Augmentin.  Increase fluids.  Rest.  Saline nasal spray.  Probiotic.  Mucinex as directed.  Humidifier in bedroom. Delsym for cough.  Call or return to clinic if symptoms are not improving.

## 2014-11-10 NOTE — Progress Notes (Signed)
   History of Present Illness: Priscilla [name] is a 29 y.o. female who present to the clinic today complaining of sinus pressure, sinus pain and nasal congestion x 1 week. Patient endorses symptoms are gradually worsening.  No with productive cough and fatigue.  Patient denies fever, chills, or shortness of breath.   History: Past Medical History  Diagnosis Date  . Abnormal Pap smear   . Hx of varicella     Current outpatient prescriptions:  .  LOMEDIA 24 FE 1-20 MG-MCG(24) tablet, Take 1 tablet by mouth daily. , Disp: , Rfl: 3 .  Multiple Vitamin (MULTIVITAMIN) tablet, Take 1 tablet by mouth daily., Disp: , Rfl:  .  amoxicillin-clavulanate (AUGMENTIN) 875-125 MG per tablet, Take 1 tablet by mouth 2 (two) times daily., Disp: 20 tablet, Rfl: 0 No Known Allergies Family History  Problem Relation Age of Onset  . Diabetes Mother   . Hyperlipidemia Father   . Hypertension Father   . Diabetes Father   . Cancer Maternal Grandmother     leaukemia  . Stroke Neg Hx   . Early death Neg Hx   . Kidney disease Neg Hx    History   Social History  . Marital Status: Married    Spouse Name: N/A  . Number of Children: N/A  . Years of Education: N/A   Social History Main Topics  . Smoking status: Former Smoker    Quit date: 06/01/2008  . Smokeless tobacco: Never Used  . Alcohol Use: 0.6 oz/week    1 Glasses of wine per week  . Drug Use: No  . Sexual Activity: Yes   Other Topics Concern  . None   Social History Narrative   Married, one 61 mo old son.   Occupation: Museum/gallery exhibitions officer.   No tobacco.  No alc/drugs.   Caffienated drinks-yes   Seat belt use often-yes   Regular Exercise-yes   Smoke alarm in the home-yes   Firearms/guns in the home-yes   History of physical abuse-no                   Review of Systems: See HPI.  All other ROS are negative.  Physical Examination: BP 113/76 mmHg  Pulse 72  Temp(Src) 98 F (36.7 C) (Oral)  Resp 16  Ht 5\' 2"  (1.575 m)  Wt  133 lb (60.328 kg)  BMI 24.32 kg/m2  SpO2 100%  General appearance: alert, cooperative and appears stated age Head: Normocephalic, without obvious abnormality, atraumatic, sinuses tender to percussion Eyes: conjunctivae/corneas clear. PERRL, EOM's intact. Fundi benign. Ears: normal TM's and external ear canals both ears Nose: moderate congestion, turbinates swollen, sinus tenderness bilateral Throat: lips, mucosa, and tongue normal; teeth and gums normal Neck: no adenopathy, no carotid bruit, no JVD, supple, symmetrical, trachea midline and thyroid not enlarged, symmetric, no tenderness/mass/nodules Lungs: clear to auscultation bilaterally Chest wall: no tenderness  Assessment/Plan: Acute bacterial sinusitis Rx Augmentin.  Increase fluids.  Rest.  Saline nasal spray.  Probiotic.  Mucinex as directed.  Humidifier in bedroom. Delsym for cough.  Call or return to clinic if symptoms are not improving.

## 2014-11-10 NOTE — Progress Notes (Signed)
Pre visit review using our clinic review tool, if applicable. No additional management support is needed unless otherwise documented below in the visit note/SLS  

## 2015-03-03 ENCOUNTER — Other Ambulatory Visit: Payer: Self-pay | Admitting: Obstetrics and Gynecology

## 2015-03-04 LAB — CYTOLOGY - PAP

## 2015-07-24 ENCOUNTER — Telehealth: Payer: Self-pay | Admitting: Physician Assistant

## 2015-07-24 ENCOUNTER — Other Ambulatory Visit: Payer: Self-pay | Admitting: Physician Assistant

## 2015-07-24 ENCOUNTER — Ambulatory Visit: Payer: 59 | Admitting: Physician Assistant

## 2015-07-24 MED ORDER — ALPRAZOLAM 0.5 MG PO TABS: ORAL_TABLET | ORAL | Status: DC

## 2015-08-10 NOTE — Telephone Encounter (Signed)
Pt was no show 07/24/15 3:45pm, acute appt, did not reschedule, charge or no charge?

## 2015-08-10 NOTE — Telephone Encounter (Signed)
No charge. Was aware she would not be in!

## 2015-09-08 MED FILL — LARIN 24 FE 1 MG-20 MCG TAB: 1-20 | 84 days supply | Qty: 84 | Fill #2

## 2015-10-13 ENCOUNTER — Encounter: Payer: Self-pay | Admitting: Internal Medicine

## 2015-10-13 ENCOUNTER — Ambulatory Visit (INDEPENDENT_AMBULATORY_CARE_PROVIDER_SITE_OTHER): Payer: 59 | Admitting: Internal Medicine

## 2015-10-13 ENCOUNTER — Other Ambulatory Visit (INDEPENDENT_AMBULATORY_CARE_PROVIDER_SITE_OTHER): Payer: 59

## 2015-10-13 VITALS — BP 130/80 | HR 79 | Temp 97.5°F | Resp 16 | Ht 62.0 in | Wt 135.0 lb

## 2015-10-13 DIAGNOSIS — Z0000 Encounter for general adult medical examination without abnormal findings: Secondary | ICD-10-CM

## 2015-10-13 LAB — COMPREHENSIVE METABOLIC PANEL WITH GFR
ALT: 19 U/L (ref 0–35)
AST: 20 U/L (ref 0–37)
Albumin: 4.5 g/dL (ref 3.5–5.2)
Alkaline Phosphatase: 43 U/L (ref 39–117)
BUN: 13 mg/dL (ref 6–23)
CO2: 23 meq/L (ref 19–32)
Calcium: 9.7 mg/dL (ref 8.4–10.5)
Chloride: 106 meq/L (ref 96–112)
Creatinine, Ser: 0.73 mg/dL (ref 0.40–1.20)
GFR: 99.63 mL/min
Glucose, Bld: 101 mg/dL — ABNORMAL HIGH (ref 70–99)
Potassium: 4 meq/L (ref 3.5–5.1)
Sodium: 139 meq/L (ref 135–145)
Total Bilirubin: 0.5 mg/dL (ref 0.2–1.2)
Total Protein: 7.3 g/dL (ref 6.0–8.3)

## 2015-10-13 LAB — CBC WITH DIFFERENTIAL/PLATELET
BASOS ABS: 0 10*3/uL (ref 0.0–0.1)
BASOS PCT: 0.5 % (ref 0.0–3.0)
EOS ABS: 0 10*3/uL (ref 0.0–0.7)
Eosinophils Relative: 0.4 % (ref 0.0–5.0)
HCT: 40.4 % (ref 36.0–46.0)
Hemoglobin: 13.6 g/dL (ref 12.0–15.0)
LYMPHS PCT: 30.8 % (ref 12.0–46.0)
Lymphs Abs: 2.6 10*3/uL (ref 0.7–4.0)
MCHC: 33.7 g/dL (ref 30.0–36.0)
MCV: 88.8 fl (ref 78.0–100.0)
MONO ABS: 0.4 10*3/uL (ref 0.1–1.0)
Monocytes Relative: 5.1 % (ref 3.0–12.0)
NEUTROS ABS: 5.2 10*3/uL (ref 1.4–7.7)
Neutrophils Relative %: 63.2 % (ref 43.0–77.0)
Platelets: 306 10*3/uL (ref 150.0–400.0)
RBC: 4.55 Mil/uL (ref 3.87–5.11)
RDW: 12.9 % (ref 11.5–15.5)
WBC: 8.3 10*3/uL (ref 4.0–10.5)

## 2015-10-13 LAB — LIPID PANEL
Cholesterol: 195 mg/dL (ref 0–200)
HDL: 81.9 mg/dL (ref >39.00)
LDL Cholesterol: 98 mg/dL (ref 0–99)
NONHDL: 113.38
Total CHOL/HDL Ratio: 2
Triglycerides: 76 mg/dL (ref 0.0–149.0)
VLDL: 15.2 mg/dL (ref 0.0–40.0)

## 2015-10-13 LAB — TSH: TSH: 1.27 u[IU]/mL (ref 0.35–4.50)

## 2015-10-13 NOTE — Patient Instructions (Signed)
Preventive Care for Adults, Female A healthy lifestyle and preventive care can promote health and wellness. Preventive health guidelines for women include the following key practices.  A routine yearly physical is a good way to check with your health care provider about your health and preventive screening. It is a chance to share any concerns and updates on your health and to receive a thorough exam.  Visit your dentist for a routine exam and preventive care every 6 months. Brush your teeth twice a day and floss once a day. Good oral hygiene prevents tooth decay and gum disease.  The frequency of eye exams is based on your age, health, family medical history, use of contact lenses, and other factors. Follow your health care provider's recommendations for frequency of eye exams.  Eat a healthy diet. Foods like vegetables, fruits, whole grains, low-fat dairy products, and lean protein foods contain the nutrients you need without too many calories. Decrease your intake of foods high in solid fats, added sugars, and salt. Eat the right amount of calories for you.Get information about a proper diet from your health care provider, if necessary.  Regular physical exercise is one of the most important things you can do for your health. Most adults should get at least 150 minutes of moderate-intensity exercise (any activity that increases your heart rate and causes you to sweat) each week. In addition, most adults need muscle-strengthening exercises on 2 or more days a week.  Maintain a healthy weight. The body mass index (BMI) is a screening tool to identify possible weight problems. It provides an estimate of body fat based on height and weight. Your health care provider can find your BMI and can help you achieve or maintain a healthy weight.For adults 20 years and older:  A BMI below 18.5 is considered underweight.  A BMI of 18.5 to 24.9 is normal.  A BMI of 25 to 29.9 is considered overweight.  A  BMI of 30 and above is considered obese.  Maintain normal blood lipids and cholesterol levels by exercising and minimizing your intake of saturated fat. Eat a balanced diet with plenty of fruit and vegetables. Blood tests for lipids and cholesterol should begin at age 45 and be repeated every 5 years. If your lipid or cholesterol levels are high, you are over 50, or you are at high risk for heart disease, you may need your cholesterol levels checked more frequently.Ongoing high lipid and cholesterol levels should be treated with medicines if diet and exercise are not working.  If you smoke, find out from your health care provider how to quit. If you do not use tobacco, do not start.  Lung cancer screening is recommended for adults aged 45-80 years who are at high risk for developing lung cancer because of a history of smoking. A yearly low-dose CT scan of the lungs is recommended for people who have at least a 30-pack-year history of smoking and are a current smoker or have quit within the past 15 years. A pack year of smoking is smoking an average of 1 pack of cigarettes a day for 1 year (for example: 1 pack a day for 30 years or 2 packs a day for 15 years). Yearly screening should continue until the smoker has stopped smoking for at least 15 years. Yearly screening should be stopped for people who develop a health problem that would prevent them from having lung cancer treatment.  If you are pregnant, do not drink alcohol. If you are  breastfeeding, be very cautious about drinking alcohol. If you are not pregnant and choose to drink alcohol, do not have more than 1 drink per day. One drink is considered to be 12 ounces (355 mL) of beer, 5 ounces (148 mL) of wine, or 1.5 ounces (44 mL) of liquor.  Avoid use of street drugs. Do not share needles with anyone. Ask for help if you need support or instructions about stopping the use of drugs.  High blood pressure causes heart disease and increases the risk  of stroke. Your blood pressure should be checked at least every 1 to 2 years. Ongoing high blood pressure should be treated with medicines if weight loss and exercise do not work.  If you are 55-79 years old, ask your health care provider if you should take aspirin to prevent strokes.  Diabetes screening is done by taking a blood sample to check your blood glucose level after you have not eaten for a certain period of time (fasting). If you are not overweight and you do not have risk factors for diabetes, you should be screened once every 3 years starting at age 45. If you are overweight or obese and you are 40-70 years of age, you should be screened for diabetes every year as part of your cardiovascular risk assessment.  Breast cancer screening is essential preventive care for women. You should practice "breast self-awareness." This means understanding the normal appearance and feel of your breasts and may include breast self-examination. Any changes detected, no matter how small, should be reported to a health care provider. Women in their 20s and 30s should have a clinical breast exam (CBE) by a health care provider as part of a regular health exam every 1 to 3 years. After age 40, women should have a CBE every year. Starting at age 40, women should consider having a mammogram (breast X-ray test) every year. Women who have a family history of breast cancer should talk to their health care provider about genetic screening. Women at a high risk of breast cancer should talk to their health care providers about having an MRI and a mammogram every year.  Breast cancer gene (BRCA)-related cancer risk assessment is recommended for women who have family members with BRCA-related cancers. BRCA-related cancers include breast, ovarian, tubal, and peritoneal cancers. Having family members with these cancers may be associated with an increased risk for harmful changes (mutations) in the breast cancer genes BRCA1 and  BRCA2. Results of the assessment will determine the need for genetic counseling and BRCA1 and BRCA2 testing.  Your health care provider may recommend that you be screened regularly for cancer of the pelvic organs (ovaries, uterus, and vagina). This screening involves a pelvic examination, including checking for microscopic changes to the surface of your cervix (Pap test). You may be encouraged to have this screening done every 3 years, beginning at age 21.  For women ages 30-65, health care providers may recommend pelvic exams and Pap testing every 3 years, or they may recommend the Pap and pelvic exam, combined with testing for human papilloma virus (HPV), every 5 years. Some types of HPV increase your risk of cervical cancer. Testing for HPV may also be done on women of any age with unclear Pap test results.  Other health care providers may not recommend any screening for nonpregnant women who are considered low risk for pelvic cancer and who do not have symptoms. Ask your health care provider if a screening pelvic exam is right for   you.  If you have had past treatment for cervical cancer or a condition that could lead to cancer, you need Pap tests and screening for cancer for at least 20 years after your treatment. If Pap tests have been discontinued, your risk factors (such as having a new sexual partner) need to be reassessed to determine if screening should resume. Some women have medical problems that increase the chance of getting cervical cancer. In these cases, your health care provider may recommend more frequent screening and Pap tests.  Colorectal cancer can be detected and often prevented. Most routine colorectal cancer screening begins at the age of 50 years and continues through age 75 years. However, your health care provider may recommend screening at an earlier age if you have risk factors for colon cancer. On a yearly basis, your health care provider may provide home test kits to check  for hidden blood in the stool. Use of a small camera at the end of a tube, to directly examine the colon (sigmoidoscopy or colonoscopy), can detect the earliest forms of colorectal cancer. Talk to your health care provider about this at age 50, when routine screening begins. Direct exam of the colon should be repeated every 5-10 years through age 75 years, unless early forms of precancerous polyps or small growths are found.  People who are at an increased risk for hepatitis B should be screened for this virus. You are considered at high risk for hepatitis B if:  You were born in a country where hepatitis B occurs often. Talk with your health care provider about which countries are considered high risk.  Your parents were born in a high-risk country and you have not received a shot to protect against hepatitis B (hepatitis B vaccine).  You have HIV or AIDS.  You use needles to inject street drugs.  You live with, or have sex with, someone who has hepatitis B.  You get hemodialysis treatment.  You take certain medicines for conditions like cancer, organ transplantation, and autoimmune conditions.  Hepatitis C blood testing is recommended for all people born from 1945 through 1965 and any individual with known risks for hepatitis C.  Practice safe sex. Use condoms and avoid high-risk sexual practices to reduce the spread of sexually transmitted infections (STIs). STIs include gonorrhea, chlamydia, syphilis, trichomonas, herpes, HPV, and human immunodeficiency virus (HIV). Herpes, HIV, and HPV are viral illnesses that have no cure. They can result in disability, cancer, and death.  You should be screened for sexually transmitted illnesses (STIs) including gonorrhea and chlamydia if:  You are sexually active and are younger than 24 years.  You are older than 24 years and your health care provider tells you that you are at risk for this type of infection.  Your sexual activity has changed  since you were last screened and you are at an increased risk for chlamydia or gonorrhea. Ask your health care provider if you are at risk.  If you are at risk of being infected with HIV, it is recommended that you take a prescription medicine daily to prevent HIV infection. This is called preexposure prophylaxis (PrEP). You are considered at risk if:  You are sexually active and do not regularly use condoms or know the HIV status of your partner(s).  You take drugs by injection.  You are sexually active with a partner who has HIV.  Talk with your health care provider about whether you are at high risk of being infected with HIV. If   you choose to begin PrEP, you should first be tested for HIV. You should then be tested every 3 months for as long as you are taking PrEP.  Osteoporosis is a disease in which the bones lose minerals and strength with aging. This can result in serious bone fractures or breaks. The risk of osteoporosis can be identified using a bone density scan. Women ages 67 years and over and women at risk for fractures or osteoporosis should discuss screening with their health care providers. Ask your health care provider whether you should take a calcium supplement or vitamin D to reduce the rate of osteoporosis.  Menopause can be associated with physical symptoms and risks. Hormone replacement therapy is available to decrease symptoms and risks. You should talk to your health care provider about whether hormone replacement therapy is right for you.  Use sunscreen. Apply sunscreen liberally and repeatedly throughout the day. You should seek shade when your shadow is shorter than you. Protect yourself by wearing long sleeves, pants, a wide-brimmed hat, and sunglasses year round, whenever you are outdoors.  Once a month, do a whole body skin exam, using a mirror to look at the skin on your back. Tell your health care provider of new moles, moles that have irregular borders, moles that  are larger than a pencil eraser, or moles that have changed in shape or color.  Stay current with required vaccines (immunizations).  Influenza vaccine. All adults should be immunized every year.  Tetanus, diphtheria, and acellular pertussis (Td, Tdap) vaccine. Pregnant women should receive 1 dose of Tdap vaccine during each pregnancy. The dose should be obtained regardless of the length of time since the last dose. Immunization is preferred during the 27th-36th week of gestation. An adult who has not previously received Tdap or who does not know her vaccine status should receive 1 dose of Tdap. This initial dose should be followed by tetanus and diphtheria toxoids (Td) booster doses every 10 years. Adults with an unknown or incomplete history of completing a 3-dose immunization series with Td-containing vaccines should begin or complete a primary immunization series including a Tdap dose. Adults should receive a Td booster every 10 years.  Varicella vaccine. An adult without evidence of immunity to varicella should receive 2 doses or a second dose if she has previously received 1 dose. Pregnant females who do not have evidence of immunity should receive the first dose after pregnancy. This first dose should be obtained before leaving the health care facility. The second dose should be obtained 4-8 weeks after the first dose.  Human papillomavirus (HPV) vaccine. Females aged 13-26 years who have not received the vaccine previously should obtain the 3-dose series. The vaccine is not recommended for use in pregnant females. However, pregnancy testing is not needed before receiving a dose. If a female is found to be pregnant after receiving a dose, no treatment is needed. In that case, the remaining doses should be delayed until after the pregnancy. Immunization is recommended for any person with an immunocompromised condition through the age of 61 years if she did not get any or all doses earlier. During the  3-dose series, the second dose should be obtained 4-8 weeks after the first dose. The third dose should be obtained 24 weeks after the first dose and 16 weeks after the second dose.  Zoster vaccine. One dose is recommended for adults aged 30 years or older unless certain conditions are present.  Measles, mumps, and rubella (MMR) vaccine. Adults born  before 1957 generally are considered immune to measles and mumps. Adults born in 1957 or later should have 1 or more doses of MMR vaccine unless there is a contraindication to the vaccine or there is laboratory evidence of immunity to each of the three diseases. A routine second dose of MMR vaccine should be obtained at least 28 days after the first dose for students attending postsecondary schools, health care workers, or international travelers. People who received inactivated measles vaccine or an unknown type of measles vaccine during 1963-1967 should receive 2 doses of MMR vaccine. People who received inactivated mumps vaccine or an unknown type of mumps vaccine before 1979 and are at high risk for mumps infection should consider immunization with 2 doses of MMR vaccine. For females of childbearing age, rubella immunity should be determined. If there is no evidence of immunity, females who are not pregnant should be vaccinated. If there is no evidence of immunity, females who are pregnant should delay immunization until after pregnancy. Unvaccinated health care workers born before 1957 who lack laboratory evidence of measles, mumps, or rubella immunity or laboratory confirmation of disease should consider measles and mumps immunization with 2 doses of MMR vaccine or rubella immunization with 1 dose of MMR vaccine.  Pneumococcal 13-valent conjugate (PCV13) vaccine. When indicated, a person who is uncertain of his immunization history and has no record of immunization should receive the PCV13 vaccine. All adults 65 years of age and older should receive this  vaccine. An adult aged 19 years or older who has certain medical conditions and has not been previously immunized should receive 1 dose of PCV13 vaccine. This PCV13 should be followed with a dose of pneumococcal polysaccharide (PPSV23) vaccine. Adults who are at high risk for pneumococcal disease should obtain the PPSV23 vaccine at least 8 weeks after the dose of PCV13 vaccine. Adults older than 30 years of age who have normal immune system function should obtain the PPSV23 vaccine dose at least 1 year after the dose of PCV13 vaccine.  Pneumococcal polysaccharide (PPSV23) vaccine. When PCV13 is also indicated, PCV13 should be obtained first. All adults aged 65 years and older should be immunized. An adult younger than age 65 years who has certain medical conditions should be immunized. Any person who resides in a nursing home or long-term care facility should be immunized. An adult smoker should be immunized. People with an immunocompromised condition and certain other conditions should receive both PCV13 and PPSV23 vaccines. People with human immunodeficiency virus (HIV) infection should be immunized as soon as possible after diagnosis. Immunization during chemotherapy or radiation therapy should be avoided. Routine use of PPSV23 vaccine is not recommended for American Indians, [place] Natives, or people younger than 65 years unless there are medical conditions that require PPSV23 vaccine. When indicated, people who have unknown immunization and have no record of immunization should receive PPSV23 vaccine. One-time revaccination 5 years after the first dose of PPSV23 is recommended for people aged 19-64 years who have chronic kidney failure, nephrotic syndrome, asplenia, or immunocompromised conditions. People who received 1-2 doses of PPSV23 before age 65 years should receive another dose of PPSV23 vaccine at age 65 years or later if at least 5 years have passed since the previous dose. Doses of PPSV23 are not  needed for people immunized with PPSV23 at or after age 65 years.  Meningococcal vaccine. Adults with asplenia or persistent complement component deficiencies should receive 2 doses of quadrivalent meningococcal conjugate (MenACWY-D) vaccine. The doses should be obtained   at least 2 months apart. Microbiologists working with certain meningococcal bacteria, [place] recruits, people at risk during an outbreak, and people who travel to or live in countries with a high rate of meningitis should be immunized. A first-year college student up through age 34 years who is living in a residence hall should receive a dose if she did not receive a dose on or after her 16th birthday. Adults who have certain high-risk conditions should receive one or more doses of vaccine.  Hepatitis A vaccine. Adults who wish to be protected from this disease, have certain high-risk conditions, work with hepatitis A-infected animals, work in hepatitis A research labs, or travel to or work in countries with a high rate of hepatitis A should be immunized. Adults who were previously unvaccinated and who anticipate close contact with an international adoptee during the first 60 days after arrival in the [place] from a country with a high rate of hepatitis A should be immunized.  Hepatitis B vaccine. Adults who wish to be protected from this disease, have certain high-risk conditions, may be exposed to blood or other infectious body fluids, are household contacts or sex partners of hepatitis B positive people, are clients or workers in certain care facilities, or travel to or work in countries with a high rate of hepatitis B should be immunized.  Haemophilus influenzae type b (Hib) vaccine. A previously unvaccinated person with asplenia or sickle cell disease or having a scheduled splenectomy should receive 1 dose of Hib vaccine. Regardless of previous immunization, a recipient of a hematopoietic stem cell transplant should receive a  3-dose series 6-12 months after her successful transplant. Hib vaccine is not recommended for adults with HIV infection. Preventive Services / Frequency Ages 35 to 4 years  Blood pressure check.** / Every 3-5 years.  Lipid and cholesterol check.** / Every 5 years beginning at age 60.  Clinical breast exam.** / Every 3 years for women in their 71s and 10s.  BRCA-related cancer risk assessment.** / For women who have family members with a BRCA-related cancer (breast, ovarian, tubal, or peritoneal cancers).  Pap test.** / Every 2 years from ages 76 through 26. Every 3 years starting at age 61 through age 76 or 93 with a history of 3 consecutive normal Pap tests.  HPV screening.** / Every 3 years from ages 37 through ages 60 to 51 with a history of 3 consecutive normal Pap tests.  Hepatitis C blood test.** / For any individual with known risks for hepatitis C.  Skin self-exam. / Monthly.  Influenza vaccine. / Every year.  Tetanus, diphtheria, and acellular pertussis (Tdap, Td) vaccine.** / Consult your health care provider. Pregnant women should receive 1 dose of Tdap vaccine during each pregnancy. 1 dose of Td every 10 years.  Varicella vaccine.** / Consult your health care provider. Pregnant females who do not have evidence of immunity should receive the first dose after pregnancy.  HPV vaccine. / 3 doses over 6 months, if 93 and younger. The vaccine is not recommended for use in pregnant females. However, pregnancy testing is not needed before receiving a dose.  Measles, mumps, rubella (MMR) vaccine.** / You need at least 1 dose of MMR if you were born in 1957 or later. You may also need a 2nd dose. For females of childbearing age, rubella immunity should be determined. If there is no evidence of immunity, females who are not pregnant should be vaccinated. If there is no evidence of immunity, females who are  pregnant should delay immunization until after pregnancy.  Pneumococcal  13-valent conjugate (PCV13) vaccine.** / Consult your health care provider.  Pneumococcal polysaccharide (PPSV23) vaccine.** / 1 to 2 doses if you smoke cigarettes or if you have certain conditions.  Meningococcal vaccine.** / 1 dose if you are age 68 to 8 years and a Market researcher living in a residence hall, or have one of several medical conditions, you need to get vaccinated against meningococcal disease. You may also need additional booster doses.  Hepatitis A vaccine.** / Consult your health care provider.  Hepatitis B vaccine.** / Consult your health care provider.  Haemophilus influenzae type b (Hib) vaccine.** / Consult your health care provider. Ages 7 to 53 years  Blood pressure check.** / Every year.  Lipid and cholesterol check.** / Every 5 years beginning at age 25 years.  Lung cancer screening. / Every year if you are aged 11-80 years and have a 30-pack-year history of smoking and currently smoke or have quit within the past 15 years. Yearly screening is stopped once you have quit smoking for at least 15 years or develop a health problem that would prevent you from having lung cancer treatment.  Clinical breast exam.** / Every year after age 48 years.  BRCA-related cancer risk assessment.** / For women who have family members with a BRCA-related cancer (breast, ovarian, tubal, or peritoneal cancers).  Mammogram.** / Every year beginning at age 41 years and continuing for as long as you are in good health. Consult with your health care provider.  Pap test.** / Every 3 years starting at age 65 years through age 37 or 70 years with a history of 3 consecutive normal Pap tests.  HPV screening.** / Every 3 years from ages 72 years through ages 60 to 40 years with a history of 3 consecutive normal Pap tests.  Fecal occult blood test (FOBT) of stool. / Every year beginning at age 21 years and continuing until age 5 years. You may not need to do this test if you get  a colonoscopy every 10 years.  Flexible sigmoidoscopy or colonoscopy.** / Every 5 years for a flexible sigmoidoscopy or every 10 years for a colonoscopy beginning at age 35 years and continuing until age 48 years.  Hepatitis C blood test.** / For all people born from [birth year] through 1965 and any individual with known risks for hepatitis C.  Skin self-exam. / Monthly.  Influenza vaccine. / Every year.  Tetanus, diphtheria, and acellular pertussis (Tdap/Td) vaccine.** / Consult your health care provider. Pregnant women should receive 1 dose of Tdap vaccine during each pregnancy. 1 dose of Td every 10 years.  Varicella vaccine.** / Consult your health care provider. Pregnant females who do not have evidence of immunity should receive the first dose after pregnancy.  Zoster vaccine.** / 1 dose for adults aged 30 years or older.  Measles, mumps, rubella (MMR) vaccine.** / You need at least 1 dose of MMR if you were born in 1957 or later. You may also need a second dose. For females of childbearing age, rubella immunity should be determined. If there is no evidence of immunity, females who are not pregnant should be vaccinated. If there is no evidence of immunity, females who are pregnant should delay immunization until after pregnancy.  Pneumococcal 13-valent conjugate (PCV13) vaccine.** / Consult your health care provider.  Pneumococcal polysaccharide (PPSV23) vaccine.** / 1 to 2 doses if you smoke cigarettes or if you have certain conditions.  Meningococcal vaccine.** /  Consult your health care provider.  Hepatitis A vaccine.** / Consult your health care provider.  Hepatitis B vaccine.** / Consult your health care provider.  Haemophilus influenzae type b (Hib) vaccine.** / Consult your health care provider. Ages 64 years and over  Blood pressure check.** / Every year.  Lipid and cholesterol check.** / Every 5 years beginning at age 23 years.  Lung cancer screening. / Every year if you  are aged 16-80 years and have a 30-pack-year history of smoking and currently smoke or have quit within the past 15 years. Yearly screening is stopped once you have quit smoking for at least 15 years or develop a health problem that would prevent you from having lung cancer treatment.  Clinical breast exam.** / Every year after age 74 years.  BRCA-related cancer risk assessment.** / For women who have family members with a BRCA-related cancer (breast, ovarian, tubal, or peritoneal cancers).  Mammogram.** / Every year beginning at age 44 years and continuing for as long as you are in good health. Consult with your health care provider.  Pap test.** / Every 3 years starting at age 58 years through age 22 or 39 years with 3 consecutive normal Pap tests. Testing can be stopped between 65 and 70 years with 3 consecutive normal Pap tests and no abnormal Pap or HPV tests in the past 10 years.  HPV screening.** / Every 3 years from ages 64 years through ages 70 or 61 years with a history of 3 consecutive normal Pap tests. Testing can be stopped between 65 and 70 years with 3 consecutive normal Pap tests and no abnormal Pap or HPV tests in the past 10 years.  Fecal occult blood test (FOBT) of stool. / Every year beginning at age 40 years and continuing until age 27 years. You may not need to do this test if you get a colonoscopy every 10 years.  Flexible sigmoidoscopy or colonoscopy.** / Every 5 years for a flexible sigmoidoscopy or every 10 years for a colonoscopy beginning at age 7 years and continuing until age 32 years.  Hepatitis C blood test.** / For all people born from [birth year] through 1965 and any individual with known risks for hepatitis C.  Osteoporosis screening.** / A one-time screening for women ages 30 years and over and women at risk for fractures or osteoporosis.  Skin self-exam. / Monthly.  Influenza vaccine. / Every year.  Tetanus, diphtheria, and acellular pertussis (Tdap/Td)  vaccine.** / 1 dose of Td every 10 years.  Varicella vaccine.** / Consult your health care provider.  Zoster vaccine.** / 1 dose for adults aged 35 years or older.  Pneumococcal 13-valent conjugate (PCV13) vaccine.** / Consult your health care provider.  Pneumococcal polysaccharide (PPSV23) vaccine.** / 1 dose for all adults aged 46 years and older.  Meningococcal vaccine.** / Consult your health care provider.  Hepatitis A vaccine.** / Consult your health care provider.  Hepatitis B vaccine.** / Consult your health care provider.  Haemophilus influenzae type b (Hib) vaccine.** / Consult your health care provider. ** Family history and personal history of risk and conditions may change your health care provider's recommendations.   This information is not intended to replace advice given to you by your health care provider. Make sure you discuss any questions you have with your health care provider.   Document Released: 09/13/2001 Document Revised: 08/08/2014 Document Reviewed: 12/13/2010 Elsevier Interactive Patient Education [employer].

## 2015-10-13 NOTE — Progress Notes (Signed)
Pre visit review using our clinic review tool, if applicable. No additional management support is needed unless otherwise documented below in the visit note. 

## 2015-10-14 NOTE — Progress Notes (Signed)
Subjective:  Patient ID: Priscilla [name], female    DOB: 28-Jul-1986  Age: 30 y.o. MRN: [id number]  CC: Annual Exam   HPI DAWT CIRELLO presents for a CPX - she feels well.  Outpatient Prescriptions Prior to Visit  Medication Sig Dispense Refill  . LOMEDIA 24 FE 1-20 MG-MCG(24) tablet Take 1 tablet by mouth daily.   3  . ALPRAZolam (XANAX) 0.5 MG tablet Take 1/2-1 tablet as directed before takeoff 10 tablet 0  . amoxicillin-clavulanate (AUGMENTIN) 875-125 MG per tablet Take 1 tablet by mouth 2 (two) times daily. 20 tablet 0  . fluconazole (DIFLUCAN) 150 MG tablet Take 1 tablet by mouth.  May repeat in 3 days if needed. 2 tablet 0  . Multiple Vitamin (MULTIVITAMIN) tablet Take 1 tablet by mouth daily.     No facility-administered medications prior to visit.    ROS Review of Systems  All other systems reviewed and are negative.   Objective:  BP 130/80 mmHg  Pulse 79  Temp(Src) 97.5 F (36.4 C) (Oral)  Resp 16  Ht 5\' 2"  (1.575 m)  Wt 135 lb (61.236 kg)  BMI 24.69 kg/m2  SpO2 97%  LMP 08/16/2015  Breastfeeding? No  BP Readings from Last 3 Encounters:  10/13/15 130/80  11/10/14 113/76  07/23/14 112/78    Wt Readings from Last 3 Encounters:  10/13/15 135 lb (61.236 kg)  11/10/14 133 lb (60.328 kg)  07/23/14 137 lb 3.2 oz (62.234 kg)    Physical Exam  Constitutional: She is oriented to person, place, and time. No distress.  HENT:  Mouth/Throat: Oropharynx is clear and moist. No oropharyngeal exudate.  Eyes: Conjunctivae are normal. Right eye exhibits no discharge. Left eye exhibits no discharge. No scleral icterus.  Neck: Normal range of motion. Neck supple. No JVD present. No tracheal deviation present. No thyromegaly present.  Cardiovascular: Normal rate, regular rhythm, normal heart sounds and intact distal pulses.  Exam reveals no gallop and no friction rub.   No murmur heard. Pulmonary/Chest: Effort normal and breath sounds normal. No stridor. No respiratory  distress. She has no wheezes. She has no rales. She exhibits no tenderness.  Abdominal: Soft. Bowel sounds are normal. She exhibits no distension and no mass. There is no tenderness. There is no rebound and no guarding.  Musculoskeletal: Normal range of motion. She exhibits no edema or tenderness.  Lymphadenopathy:    She has no cervical adenopathy.  Neurological: She is oriented to person, place, and time.  Skin: Skin is warm and dry. No rash noted. She is not diaphoretic. No erythema. No pallor.  Psychiatric: She has a normal mood and affect. Her behavior is normal. Judgment and thought content normal.  Vitals reviewed.   Lab Results  Component Value Date   WBC 8.3 10/13/2015   HGB 13.6 10/13/2015   HCT 40.4 10/13/2015   PLT 306.0 10/13/2015   GLUCOSE 101* 10/13/2015   CHOL 195 10/13/2015   TRIG 76.0 10/13/2015   HDL 81.90 10/13/2015   LDLCALC 98 10/13/2015   ALT 19 10/13/2015   AST 20 10/13/2015   NA 139 10/13/2015   K 4.0 10/13/2015   CL 106 10/13/2015   CREATININE 0.73 10/13/2015   BUN 13 10/13/2015   CO2 23 10/13/2015   TSH 1.27 10/13/2015    No results found.  Assessment & Plan:   Taysia was seen today for annual exam.  Diagnoses and all orders for this visit:  Routine general medical examination at a health care  facility- Her vaccines are up-to-date, Pap smear is up-to-date, exam completed, labs ordered and reviewed-all are normal, she was given patient education material. -     Lipid panel; Future -     Comprehensive metabolic panel; Future -     CBC with Differential/Platelet; Future -     TSH; Future   I have discontinued Ms. Batchelder's multivitamin, amoxicillin-clavulanate, fluconazole, and ALPRAZolam. I am also having her maintain her LOMEDIA 24 FE.  No orders of the defined types were placed in this encounter.     Follow-up: Return if symptoms worsen or fail to improve.  [name], MD

## 2015-11-30 MED FILL — LARIN 24 FE 1 MG-20 MCG TAB: 1-20 | 84 days supply | Qty: 84 | Fill #3

## 2015-12-18 ENCOUNTER — Other Ambulatory Visit (INDEPENDENT_AMBULATORY_CARE_PROVIDER_SITE_OTHER): Payer: 59

## 2015-12-18 ENCOUNTER — Other Ambulatory Visit: Payer: Self-pay | Admitting: Family Medicine

## 2015-12-18 DIAGNOSIS — R319 Hematuria, unspecified: Secondary | ICD-10-CM

## 2015-12-18 DIAGNOSIS — R300 Dysuria: Secondary | ICD-10-CM

## 2015-12-18 LAB — POC URINALSYSI DIPSTICK (AUTOMATED)
Bilirubin, UA: NEGATIVE
GLUCOSE UA: NEGATIVE
Ketones, UA: NEGATIVE
NITRITE UA: NEGATIVE
PH UA: 6
UROBILINOGEN UA: 0.2

## 2015-12-18 MED ORDER — CIPROFLOXACIN HCL 500 MG PO TABS: 500.0000 mg | ORAL_TABLET | Freq: Two times a day (BID) | ORAL | Status: DC

## 2015-12-18 MED ORDER — PHENAZOPYRIDINE HCL 200 MG PO TABS: 200.0000 mg | ORAL_TABLET | Freq: Three times a day (TID) | ORAL | Status: DC | PRN

## 2015-12-18 MED FILL — CIPROFLOXACIN HCL 500 MG TA: 500 | 5 days supply | Qty: 10 | Fill #0

## 2015-12-18 MED FILL — PHENAZOPYRIDINE 200 MG TAB: 200 | 2 days supply | Qty: 6 | Fill #0

## 2015-12-21 LAB — URINE CULTURE: Colony Count: 75000

## 2016-03-15 DIAGNOSIS — Z01419 Encounter for gynecological examination (general) (routine) without abnormal findings: Secondary | ICD-10-CM | POA: Diagnosis not present

## 2016-03-15 DIAGNOSIS — Z6824 Body mass index (BMI) 24.0-24.9, adult: Secondary | ICD-10-CM | POA: Diagnosis not present

## 2016-05-05 DIAGNOSIS — Z0100 Encounter for examination of eyes and vision without abnormal findings: Secondary | ICD-10-CM | POA: Diagnosis not present

## 2016-05-12 MED FILL — LARIN 24 FE 1 MG-20 MCG TAB: 1-20 | 84 days supply | Qty: 84 | Fill #0

## 2016-07-05 DIAGNOSIS — B078 Other viral warts: Secondary | ICD-10-CM | POA: Diagnosis not present

## 2016-07-05 DIAGNOSIS — L918 Other hypertrophic disorders of the skin: Secondary | ICD-10-CM | POA: Diagnosis not present

## 2016-07-05 DIAGNOSIS — D1801 Hemangioma of skin and subcutaneous tissue: Secondary | ICD-10-CM | POA: Diagnosis not present

## 2016-07-05 DIAGNOSIS — D225 Melanocytic nevi of trunk: Secondary | ICD-10-CM | POA: Diagnosis not present

## 2016-07-05 DIAGNOSIS — D224 Melanocytic nevi of scalp and neck: Secondary | ICD-10-CM | POA: Diagnosis not present

## 2016-07-05 DIAGNOSIS — D2262 Melanocytic nevi of left upper limb, including shoulder: Secondary | ICD-10-CM | POA: Diagnosis not present

## 2016-07-05 DIAGNOSIS — D2272 Melanocytic nevi of left lower limb, including hip: Secondary | ICD-10-CM | POA: Diagnosis not present

## 2016-07-05 DIAGNOSIS — D2271 Melanocytic nevi of right lower limb, including hip: Secondary | ICD-10-CM | POA: Diagnosis not present

## 2016-08-02 MED FILL — LARIN 24 FE 1 MG-20 MCG TAB: 1-20 | 56 days supply | Qty: 56 | Fill #1

## 2016-09-23 MED FILL — LARIN 24 FE 1 MG-20 MCG TAB: 1-20 | 84 days supply | Qty: 84 | Fill #0

## 2016-10-03 ENCOUNTER — Telehealth: Payer: 59 | Admitting: Family

## 2016-10-03 DIAGNOSIS — B9689 Other specified bacterial agents as the cause of diseases classified elsewhere: Secondary | ICD-10-CM

## 2016-10-03 DIAGNOSIS — J028 Acute pharyngitis due to other specified organisms: Secondary | ICD-10-CM

## 2016-10-03 MED ORDER — PREDNISONE 5 MG PO TABS: 5.0000 mg | ORAL_TABLET | ORAL | 0 refills | Status: DC

## 2016-10-03 MED ORDER — AZITHROMYCIN 250 MG PO TABS: ORAL_TABLET | ORAL | 0 refills | Status: DC

## 2016-10-03 MED ORDER — BENZONATATE 100 MG PO CAPS: 100.0000 mg | ORAL_CAPSULE | Freq: Three times a day (TID) | ORAL | 0 refills | Status: DC | PRN

## 2016-10-03 MED FILL — AZITHROMYCIN 250 MG TABLET: 250 | 5 days supply | Qty: 6 | Fill #0

## 2016-10-03 MED FILL — BENZONATATE 100 MG CAP: 100 | 5 days supply | Qty: 30 | Fill #0

## 2016-10-03 MED FILL — predniSONE 5 MG TABS: 5 | 6 days supply | Qty: 21 | Fill #0

## 2016-10-03 NOTE — Progress Notes (Signed)
We are sorry that you are not feeling well.  Here is how we plan to help!  Based on what you have shared with me it looks like you have upper respiratory tract inflammation that has resulted in a significant cough.  Inflammation and infection in the upper respiratory tract is commonly called bronchitis and has four common causes:  Allergies, Viral Infections, Acid Reflux and Bacterial Infections.  Allergies, viruses and acid reflux are treated by controlling symptoms or eliminating the cause. An example might be a cough caused by taking certain blood pressure medications. You stop the cough by changing the medication. Another example might be a cough caused by acid reflux. Controlling the reflux helps control the cough.  Based on your presentation I believe you most likely have A cough due to bacteria.  When patients have a fever and a productive cough with a change in color or increased sputum production, we are concerned about bacterial bronchitis.  If left untreated it can progress to pneumonia.  If your symptoms do not improve with your treatment plan it is important that you contact your provider.   I have prescribed Azithromyin 250 mg: two tables now and then one tablet daily for 4 additonal days    In addition you may use A non-prescription cough medication called Mucinex DM: take 2 tablets every 12 hours. and A prescription cough medication called Tessalon Perles 100mg. You may take 1-2 capsules every 8 hours as needed for your cough.  Sterapred 5 mg dosepak  USE OF BRONCHODILATOR ("RESCUE") INHALERS: There is a risk from using your bronchodilator too frequently.  The risk is that over-reliance on a medication which only relaxes the muscles surrounding the breathing tubes can reduce the effectiveness of medications prescribed to reduce swelling and congestion of the tubes themselves.  Although you feel brief relief from the bronchodilator inhaler, your asthma may actually be worsening with the  tubes becoming more swollen and filled with mucus.  This can delay other crucial treatments, such as oral steroid medications. If you need to use a bronchodilator inhaler daily, several times per day, you should discuss this with your provider.  There are probably better treatments that could be used to keep your asthma under control.     HOME CARE . Only take medications as instructed by your medical team. . Complete the entire course of an antibiotic. . Drink plenty of fluids and get plenty of rest. . Avoid close contacts especially the very young and the elderly . Cover your mouth if you cough or cough into your sleeve. . Always remember to wash your hands . A steam or ultrasonic humidifier can help congestion.   GET HELP RIGHT AWAY IF: . You develop worsening fever. . You become short of breath . You cough up blood. . Your symptoms persist after you have completed your treatment plan MAKE SURE YOU   Understand these instructions.  Will watch your condition.  Will get help right away if you are not doing well or get worse.  Your e-visit answers were reviewed by a board certified advanced clinical practitioner to complete your personal care plan.  Depending on the condition, your plan could have included both over the counter or prescription medications. If there is a problem please reply  once you have received a response from your provider. Your safety is important to us.  If you have drug allergies check your prescription carefully.    You can use MyChart to ask questions about today's   visit, request a non-urgent call back, or ask for a work or school excuse for 24 hours related to this e-Visit. If it has been greater than 24 hours you will need to follow up with your provider, or enter a new e-Visit to address those concerns. You will get an e-mail in the next two days asking about your experience.  I hope that your e-visit has been valuable and will speed your recovery. Thank you  for using e-visits.   

## 2016-11-23 ENCOUNTER — Telehealth: Payer: Self-pay

## 2016-11-23 MED ORDER — VALACYCLOVIR HCL 1 G PO TABS: 1000.0000 mg | ORAL_TABLET | Freq: Three times a day (TID) | ORAL | 0 refills | Status: DC

## 2016-11-23 MED FILL — valACYclovir HCL 1 GM TABS: 1 | 10 days supply | Qty: 30 | Fill #0

## 2016-11-23 NOTE — Telephone Encounter (Signed)
Verbal order given by Dr. Carollee Herter for Valtrex 1000 mg #30; Instructions:  Take one tablet three times a day for 10 days.  If no visible out break noted, take 2 tablets now and 2 tablets in 12 hours to prevent out break.

## 2016-12-20 MED FILL — LARIN 24 FE 1 MG-20 MCG TAB: 1-20 | 84 days supply | Qty: 84 | Fill #1

## 2017-03-20 ENCOUNTER — Telehealth: Payer: 59 | Admitting: Family

## 2017-03-20 DIAGNOSIS — B373 Candidiasis of vulva and vagina: Secondary | ICD-10-CM | POA: Diagnosis not present

## 2017-03-20 DIAGNOSIS — B3731 Acute candidiasis of vulva and vagina: Secondary | ICD-10-CM

## 2017-03-20 MED ORDER — FLUCONAZOLE 150 MG PO TABS: 150.0000 mg | ORAL_TABLET | Freq: Once | ORAL | 0 refills | Status: AC

## 2017-03-20 MED FILL — LARIN 24 FE 1 MG-20 MCG TAB: 1-20 | 28 days supply | Qty: 28 | Fill #2

## 2017-03-20 MED FILL — FLUCONAZOLE 150 MG TABLET: 150 | 1 days supply | Qty: 1 | Fill #0

## 2017-03-20 NOTE — Progress Notes (Signed)
Thank you for the details you put in the comment boxes. Those details really help us take better care of you.   We are sorry that you are not feeling well. Here is how we plan to help! Based on what you shared with me it looks like you: May have a yeast vaginosis  Vaginosis is an inflammation of the vagina that can result in discharge, itching and pain. The cause is usually a change in the normal balance of vaginal bacteria or an infection. Vaginosis can also result from reduced estrogen levels after menopause.  The most common causes of vaginosis are:   Bacterial vaginosis which results from an overgrowth of one on several organisms that are normally present in your vagina.   Yeast infections which are caused by a naturally occurring fungus called candida.   Vaginal atrophy (atrophic vaginosis) which results from the thinning of the vagina from reduced estrogen levels after menopause.   Trichomoniasis which is caused by a parasite and is commonly transmitted by sexual intercourse.  Factors that increase your risk of developing vaginosis include: . Medications, such as antibiotics and steroids . Uncontrolled diabetes . Use of hygiene products such as bubble bath, vaginal spray or vaginal deodorant . Douching . Wearing damp or tight-fitting clothing . Using an intrauterine device (IUD) for birth control . Hormonal changes, such as those associated with pregnancy, birth control pills or menopause . Sexual activity . Having a sexually transmitted infection  Your treatment plan is A single Diflucan (fluconazole) 150mg tablet once.  I have electronically sent this prescription into the pharmacy that you have chosen.  Be sure to take all of the medication as directed. Stop taking any medication if you develop a rash, tongue swelling or shortness of breath. Mothers who are breast feeding should consider pumping and discarding their breast milk while on these antibiotics. However, there is no  consensus that infant exposure at these doses would be harmful.  Remember that medication creams can weaken latex condoms. .   HOME CARE:  Good hygiene may prevent some types of vaginosis from recurring and may relieve some symptoms:  . Avoid baths, hot tubs and whirlpool spas. Rinse soap from your outer genital area after a shower, and dry the area well to prevent irritation. Don't use scented or harsh soaps, such as those with deodorant or antibacterial action. . Avoid irritants. These include scented tampons and pads. . Wipe from front to back after using the toilet. Doing so avoids spreading fecal bacteria to your vagina.  Other things that may help prevent vaginosis include:  . Don't douche. Your vagina doesn't require cleansing other than normal bathing. Repetitive douching disrupts the normal organisms that reside in the vagina and can actually increase your risk of vaginal infection. Douching won't clear up a vaginal infection. . Use a latex condom. Both female and female latex condoms may help you avoid infections spread by sexual contact. . Wear cotton underwear. Also wear pantyhose with a cotton crotch. If you feel comfortable without it, skip wearing underwear to bed. Yeast thrives in moist environments Your symptoms should improve in the next day or two.  GET HELP RIGHT AWAY IF:  . You have pain in your lower abdomen ( pelvic area or over your ovaries) . You develop nausea or vomiting . You develop a fever . Your discharge changes or worsens . You have persistent pain with intercourse . You develop shortness of breath, a rapid pulse, or you faint.  These symptoms   could be signs of problems or infections that need to be evaluated by a medical provider now.  MAKE SURE YOU    Understand these instructions.  Will watch your condition.  Will get help right away if you are not doing well or get worse.  Your e-visit answers were reviewed by a board certified advanced  clinical practitioner to complete your personal care plan. Depending upon the condition, your plan could have included both over the counter or prescription medications. Please review your pharmacy choice to make sure that you have choses a pharmacy that is open for you to pick up any needed prescription, Your safety is important to us. If you have drug allergies check your prescription carefully.   You can use MyChart to ask questions about today's visit, request a non-urgent call back, or ask for a work or school excuse for 24 hours related to this e-Visit. If it has been greater than 24 hours you will need to follow up with your provider, or enter a new e-Visit to address those concerns. You will get a MyChart message within the next two days asking about your experience. I hope that your e-visit has been valuable and will speed your recovery.  

## 2017-03-27 DIAGNOSIS — Z01419 Encounter for gynecological examination (general) (routine) without abnormal findings: Secondary | ICD-10-CM | POA: Diagnosis not present

## 2017-03-27 DIAGNOSIS — Z6825 Body mass index (BMI) 25.0-25.9, adult: Secondary | ICD-10-CM | POA: Diagnosis not present

## 2017-03-27 MED FILL — MELODETTA 24 FE CHEWABLE TA: 1-20 | 84 days supply | Qty: 84 | Fill #0

## 2017-05-08 DIAGNOSIS — Z0100 Encounter for examination of eyes and vision without abnormal findings: Secondary | ICD-10-CM | POA: Diagnosis not present

## 2017-06-08 MED FILL — MELODETTA 24 FE CHEWABLE TA: 1-20 | 84 days supply | Qty: 84 | Fill #1

## 2017-07-03 ENCOUNTER — Encounter: Payer: Self-pay | Admitting: Internal Medicine

## 2017-07-04 DIAGNOSIS — D2272 Melanocytic nevi of left lower limb, including hip: Secondary | ICD-10-CM | POA: Diagnosis not present

## 2017-07-04 DIAGNOSIS — D2271 Melanocytic nevi of right lower limb, including hip: Secondary | ICD-10-CM | POA: Diagnosis not present

## 2017-07-04 DIAGNOSIS — L814 Other melanin hyperpigmentation: Secondary | ICD-10-CM | POA: Diagnosis not present

## 2017-07-04 DIAGNOSIS — D2262 Melanocytic nevi of left upper limb, including shoulder: Secondary | ICD-10-CM | POA: Diagnosis not present

## 2017-07-04 DIAGNOSIS — D225 Melanocytic nevi of trunk: Secondary | ICD-10-CM | POA: Diagnosis not present

## 2017-07-04 DIAGNOSIS — D224 Melanocytic nevi of scalp and neck: Secondary | ICD-10-CM | POA: Diagnosis not present

## 2017-08-21 ENCOUNTER — Telehealth: Payer: 59 | Admitting: Family

## 2017-08-21 DIAGNOSIS — B373 Candidiasis of vulva and vagina: Secondary | ICD-10-CM | POA: Diagnosis not present

## 2017-08-21 DIAGNOSIS — B3731 Acute candidiasis of vulva and vagina: Secondary | ICD-10-CM

## 2017-08-21 MED ORDER — FLUCONAZOLE 150 MG PO TABS: 150.0000 mg | ORAL_TABLET | Freq: Once | ORAL | 0 refills | Status: AC

## 2017-08-21 MED FILL — FLUCONAZOLE 150 MG TABLET: 150 | 1 days supply | Qty: 1 | Fill #0

## 2017-08-21 MED FILL — MELODETTA 24 FE CHEWABLE TA: 1-20 | 84 days supply | Qty: 84 | Fill #2

## 2017-08-21 NOTE — Progress Notes (Signed)

## 2017-11-14 ENCOUNTER — Encounter: Payer: 59 | Admitting: Internal Medicine

## 2017-11-15 ENCOUNTER — Ambulatory Visit (INDEPENDENT_AMBULATORY_CARE_PROVIDER_SITE_OTHER): Payer: 59 | Admitting: Internal Medicine

## 2017-11-15 ENCOUNTER — Other Ambulatory Visit (INDEPENDENT_AMBULATORY_CARE_PROVIDER_SITE_OTHER): Payer: 59

## 2017-11-15 ENCOUNTER — Encounter: Payer: Self-pay | Admitting: Internal Medicine

## 2017-11-15 VITALS — BP 112/76 | HR 79 | Temp 98.8°F | Resp 16 | Ht 62.0 in | Wt 136.0 lb

## 2017-11-15 DIAGNOSIS — Z0000 Encounter for general adult medical examination without abnormal findings: Secondary | ICD-10-CM | POA: Diagnosis not present

## 2017-11-15 DIAGNOSIS — R739 Hyperglycemia, unspecified: Secondary | ICD-10-CM | POA: Diagnosis not present

## 2017-11-15 LAB — BASIC METABOLIC PANEL
BUN: 11 mg/dL (ref 6–23)
CALCIUM: 9.6 mg/dL (ref 8.4–10.5)
CO2: 24 meq/L (ref 19–32)
CREATININE: 0.85 mg/dL (ref 0.40–1.20)
Chloride: 100 mEq/L (ref 96–112)
GFR: 82.44 mL/min (ref >60.00)
Glucose, Bld: 99 mg/dL (ref 70–99)
Potassium: 4.1 mEq/L (ref 3.5–5.1)
Sodium: 140 mEq/L (ref 135–145)

## 2017-11-15 LAB — LIPID PANEL
CHOLESTEROL: 188 mg/dL (ref 0–200)
HDL: 81.6 mg/dL (ref >39.00)
LDL Cholesterol: 87 mg/dL (ref 0–99)
NonHDL: 106.11
Total CHOL/HDL Ratio: 2
Triglycerides: 96 mg/dL (ref 0.0–149.0)
VLDL: 19.2 mg/dL (ref 0.0–40.0)

## 2017-11-15 LAB — HEMOGLOBIN A1C: HEMOGLOBIN A1C: 5.4 % (ref 4.6–6.5)

## 2017-11-15 NOTE — Progress Notes (Signed)
Subjective:  Patient ID: Priscilla [name], female    DOB: 1985-08-05  Age: 32 y.o. MRN: [id number]  CC: Annual Exam   HPI Priscilla [name] presents for a CPX.  She feels well and offers no complaints.  Outpatient Medications Prior to Visit  Medication Sig Dispense Refill  . LOMEDIA 24 FE 1-20 MG-MCG(24) tablet Take 1 tablet by mouth daily.   3  . phenazopyridine (PYRIDIUM) 200 MG tablet Take 1 tablet (200 mg total) by mouth 3 (three) times daily as needed for pain. 6 tablet 0  . valACYclovir (VALTREX) 1000 MG tablet Take 1 tablet (1,000 mg total) by mouth 3 (three) times daily. Take for 10 days. 30 tablet 0  . azithromycin (ZITHROMAX) 250 MG tablet Take 2 tabs now then 1 daily times 4 days. 6 tablet 0  . benzonatate (TESSALON PERLES) 100 MG capsule Take 1-2 capsules (100-200 mg total) by mouth every 8 (eight) hours as needed for cough. 30 capsule 0  . ciprofloxacin (CIPRO) 500 MG tablet Take 1 tablet (500 mg total) by mouth 2 (two) times daily. 10 tablet 0  . predniSONE (DELTASONE) 5 MG tablet Take 1 tablet (5 mg total) by mouth as directed. 21 dose taper per pharmacy (6 day) 21 tablet 0   No facility-administered medications prior to visit.     ROS Review of Systems  All other systems reviewed and are negative.   Objective:  BP 112/76   Pulse 79   Temp 98.8 F (37.1 C) (Oral)   Resp 16   Ht 5\' 2"  (1.575 m)   Wt 136 lb (61.7 kg)   SpO2 98%   BMI 24.87 kg/m   BP Readings from Last 3 Encounters:  11/15/17 112/76  10/13/15 130/80  11/10/14 113/76    Wt Readings from Last 3 Encounters:  11/15/17 136 lb (61.7 kg)  10/13/15 135 lb (61.2 kg)  11/10/14 133 lb (60.3 kg)    Physical Exam  Constitutional: She is oriented to person, place, and time. She appears well-developed and well-nourished.  HENT:  Mouth/Throat: Oropharynx is clear and moist. No oropharyngeal exudate.  Eyes: Conjunctivae are normal. Left eye exhibits no discharge. No scleral icterus.  Neck: Normal  range of motion. Neck supple. No JVD present. No thyromegaly present.  Cardiovascular: Normal rate, regular rhythm and normal heart sounds. Exam reveals no gallop and no friction rub.  No murmur heard. Pulmonary/Chest: Effort normal and breath sounds normal. No stridor. No respiratory distress. She has no wheezes. She has no rales.  Abdominal: Soft. Bowel sounds are normal. She exhibits no mass. There is no tenderness.  Musculoskeletal: Normal range of motion. She exhibits no edema, tenderness or deformity.  Lymphadenopathy:    She has no cervical adenopathy.  Neurological: She is alert and oriented to person, place, and time.  Skin: Skin is warm and dry. No pallor.  Psychiatric: She has a normal mood and affect. Her behavior is normal. Judgment and thought content normal.  Vitals reviewed.   Lab Results  Component Value Date   WBC 8.3 10/13/2015   HGB 13.6 10/13/2015   HCT 40.4 10/13/2015   PLT 306.0 10/13/2015   GLUCOSE 99 11/15/2017   CHOL 188 11/15/2017   TRIG 96.0 11/15/2017   HDL 81.60 11/15/2017   LDLCALC 87 11/15/2017   ALT 19 10/13/2015   AST 20 10/13/2015   NA 140 11/15/2017   K 4.1 11/15/2017   CL 100 11/15/2017   CREATININE 0.85 11/15/2017   BUN 11  11/15/2017   CO2 24 11/15/2017   TSH 1.27 10/13/2015   HGBA1C 5.4 11/15/2017    No results found.  Assessment & Plan:   Priscilla [name] was seen today for annual exam.  Diagnoses and all orders for this visit:  Routine general medical examination at a health care facility- Exam completed, labs reviewed, vaccines reviewed, her Pap smear is up-to-date, patient education material was normal. -     Lipid panel; Future  Hyperglycemia-blood sugars are normal now. -     Basic metabolic panel; Future -     Hemoglobin A1c; Future   I have discontinued Priscilla [name] "Priscilla [name]"'s ciprofloxacin, benzonatate, azithromycin, and predniSONE. I am also having her maintain her LOMEDIA 24 FE, phenazopyridine, and valACYclovir.  No  orders of the defined types were placed in this encounter.    Follow-up: Return if symptoms worsen or fail to improve.  Priscilla Calico, MD

## 2017-11-15 NOTE — Patient Instructions (Signed)
Preventive Care 18-39 Years, Female Preventive care refers to lifestyle choices and visits with your health care provider that can promote health and wellness. What does preventive care include?  A yearly physical exam. This is also called an annual well check.  Dental exams once or twice a year.  Routine eye exams. Ask your health care provider how often you should have your eyes checked.  Personal lifestyle choices, including: ? Daily care of your teeth and gums. ? Regular physical activity. ? Eating a healthy diet. ? Avoiding tobacco and drug use. ? Limiting alcohol use. ? Practicing safe sex. ? Taking vitamin and mineral supplements as recommended by your health care provider. What happens during an annual well check? The services and screenings done by your health care provider during your annual well check will depend on your age, overall health, lifestyle risk factors, and family history of disease. Counseling Your health care provider may ask you questions about your:  Alcohol use.  Tobacco use.  Drug use.  Emotional well-being.  Home and relationship well-being.  Sexual activity.  Eating habits.  Work and work Statistician.  Method of birth control.  Menstrual cycle.  Pregnancy history.  Screening You may have the following tests or measurements:  Height, weight, and BMI.  Diabetes screening. This is done by checking your blood sugar (glucose) after you have not eaten for a while (fasting).  Blood pressure.  Lipid and cholesterol levels. These may be checked every 5 years starting at age 66.  Skin check.  Hepatitis C blood test.  Hepatitis B blood test.  Sexually transmitted disease (STD) testing.  BRCA-related cancer screening. This may be done if you have a family history of breast, ovarian, tubal, or peritoneal cancers.  Pelvic exam and Pap test. This may be done every 3 years starting at age 40. Starting at age 59, this may be done every 5  years if you have a Pap test in combination with an HPV test.  Discuss your test results, treatment options, and if necessary, the need for more tests with your health care provider. Vaccines Your health care provider may recommend certain vaccines, such as:  Influenza vaccine. This is recommended every year.  Tetanus, diphtheria, and acellular pertussis (Tdap, Td) vaccine. You may need a Td booster every 10 years.  Varicella vaccine. You may need this if you have not been vaccinated.  HPV vaccine. If you are 69 or younger, you may need three doses over 6 months.  Measles, mumps, and rubella (MMR) vaccine. You may need at least one dose of MMR. You may also need a second dose.  Pneumococcal 13-valent conjugate (PCV13) vaccine. You may need this if you have certain conditions and were not previously vaccinated.  Pneumococcal polysaccharide (PPSV23) vaccine. You may need one or two doses if you smoke cigarettes or if you have certain conditions.  Meningococcal vaccine. One dose is recommended if you are age 27-21 years and a first-year college student living in a residence hall, or if you have one of several medical conditions. You may also need additional booster doses.  Hepatitis A vaccine. You may need this if you have certain conditions or if you travel or work in places where you may be exposed to hepatitis A.  Hepatitis B vaccine. You may need this if you have certain conditions or if you travel or work in places where you may be exposed to hepatitis B.  Haemophilus influenzae type b (Hib) vaccine. You may need this if  you have certain risk factors.  Talk to your health care provider about which screenings and vaccines you need and how often you need them. This information is not intended to replace advice given to you by your health care provider. Make sure you discuss any questions you have with your health care provider. Document Released: 09/13/2001 Document Revised: 04/06/2016  Document Reviewed: 05/19/2015 Elsevier Interactive Patient Education  [employer].

## 2017-11-21 MED FILL — MELODETTA 24 FE CHEWABLE TA: 1-20 | 84 days supply | Qty: 84 | Fill #0

## 2017-12-30 ENCOUNTER — Telehealth: Payer: 59 | Admitting: Family

## 2017-12-30 DIAGNOSIS — J301 Allergic rhinitis due to pollen: Secondary | ICD-10-CM | POA: Diagnosis not present

## 2017-12-30 NOTE — Progress Notes (Signed)
Thank you for the details you included in the comment boxes. Those details are very helpful in determining the best course of treatment for you and help [place] to provide the best care.  E visit for Allergic Rhinitis We are sorry that you are not feeling well.  Her is how we plan to help!  Based on what you have shared with me it looks like you have Allergic Rhinitis.  Rhinitis is when a reaction occurs that causes nasal congestion, runny nose, sneezing, and itching.  Most types of rhinitis are caused by an inflammation and are associated with symptoms in the eyes ears or throat. There are several types of rhinitis.  The most common are acute rhinitis, which is usually caused by a viral illness, allergic or seasonal rhinitis, and nonallergic or year-round rhinitis.  Nasal allergies occur certain times of the year.  Allergic rhinitis is caused when allergens in the air trigger the release of histamine in the body.  Histamine causes itching, swelling, and fluid to build up in the fragile linings of the nasal passages, sinuses and eyelids.  An itchy nose and clear discharge are common.  I recommend the following over the counter treatments: Xyzal 5 mg take 1 tablet daily  I also would recommend a nasal spray: Flonase 2 sprays into each nostril once daily  You may also benefit from eye drops such as: Visine 1-2 drops each eye twice daily as needed  HOME CARE:   You can use an over-the-counter saline nasal spray as needed  Avoid areas where there is heavy dust, mites, or molds  Stay indoors on windy days during the pollen season  Keep windows closed in home, at least in bedroom; use air conditioner.  Use high-efficiency house air filter  Keep windows closed in car, turn AC on re-circulate  Avoid playing out with dog during pollen season  GET HELP RIGHT AWAY IF:   If your symptoms do not improve within 10 days  You become short of breath  You develop yellow or green discharge from your  nose for over 3 days  You have coughing fits  MAKE SURE YOU:   Understand these instructions  Will watch your condition  Will get help right away if you are not doing well or get worse  Thank you for choosing an e-visit. Your e-visit answers were reviewed by a board certified advanced clinical practitioner to complete your personal care plan. Depending upon the condition, your plan could have included both over the counter or prescription medications. Please review your pharmacy choice. Be sure that the pharmacy you have chosen is open so that you can pick up your prescription now.  If there is a problem you may message your provider in [place] to have the prescription routed to another pharmacy. Your safety is important to [place]. If you have drug allergies check your prescription carefully.  For the next 24 hours, you can use MyChart to ask questions about today's visit, request a non-urgent call back, or ask for a work or school excuse from your e-visit provider. You will get an email in the next two days asking about your experience. I hope that your e-visit has been valuable and will speed your recovery.

## 2018-01-02 DIAGNOSIS — D2271 Melanocytic nevi of right lower limb, including hip: Secondary | ICD-10-CM | POA: Diagnosis not present

## 2018-01-02 DIAGNOSIS — D485 Neoplasm of uncertain behavior of skin: Secondary | ICD-10-CM | POA: Diagnosis not present

## 2018-01-19 MED FILL — MIBELAS 24 FE CHEWABLE TAB: 1-20 | 28 days supply | Qty: 28 | Fill #1

## 2018-02-09 DIAGNOSIS — Z30430 Encounter for insertion of intrauterine contraceptive device: Secondary | ICD-10-CM | POA: Diagnosis not present

## 2018-02-09 DIAGNOSIS — Z3202 Encounter for pregnancy test, result negative: Secondary | ICD-10-CM | POA: Diagnosis not present

## 2018-02-09 DIAGNOSIS — Z113 Encounter for screening for infections with a predominantly sexual mode of transmission: Secondary | ICD-10-CM | POA: Diagnosis not present

## 2018-03-20 LAB — PSA

## 2018-03-22 DIAGNOSIS — Z30431 Encounter for routine checking of intrauterine contraceptive device: Secondary | ICD-10-CM | POA: Diagnosis not present

## 2018-04-03 DIAGNOSIS — Z01419 Encounter for gynecological examination (general) (routine) without abnormal findings: Secondary | ICD-10-CM | POA: Diagnosis not present

## 2018-04-03 DIAGNOSIS — Z30431 Encounter for routine checking of intrauterine contraceptive device: Secondary | ICD-10-CM | POA: Diagnosis not present

## 2018-05-19 ENCOUNTER — Encounter: Payer: Self-pay | Admitting: Internal Medicine

## 2018-05-21 ENCOUNTER — Other Ambulatory Visit: Payer: Self-pay | Admitting: Internal Medicine

## 2018-05-21 DIAGNOSIS — B001 Herpesviral vesicular dermatitis: Secondary | ICD-10-CM

## 2018-05-21 MED ORDER — VALACYCLOVIR HCL 1 G PO TABS: 2000.0000 mg | ORAL_TABLET | Freq: Two times a day (BID) | ORAL | 3 refills | Status: DC

## 2018-05-21 MED FILL — valACYclovir HCL 1 GM TABS: 1 | 1 days supply | Qty: 4 | Fill #0

## 2018-05-28 DIAGNOSIS — Z0100 Encounter for examination of eyes and vision without abnormal findings: Secondary | ICD-10-CM | POA: Diagnosis not present

## 2018-06-20 ENCOUNTER — Ambulatory Visit (INDEPENDENT_AMBULATORY_CARE_PROVIDER_SITE_OTHER): Payer: 59 | Admitting: Family Medicine

## 2018-06-20 ENCOUNTER — Encounter: Payer: Self-pay | Admitting: Family Medicine

## 2018-06-20 VITALS — BP 108/80 | HR 101 | Ht 62.0 in | Wt 136.0 lb

## 2018-06-20 DIAGNOSIS — M5412 Radiculopathy, cervical region: Secondary | ICD-10-CM | POA: Diagnosis not present

## 2018-06-20 MED ORDER — PREDNISONE 50 MG PO TABS: 50.0000 mg | ORAL_TABLET | Freq: Every day | ORAL | 0 refills | Status: DC

## 2018-06-20 MED ORDER — KETOROLAC TROMETHAMINE 60 MG/2ML IM SOLN
60.0000 mg | Freq: Once | INTRAMUSCULAR | Status: AC
Start: 2018-06-20 — End: 2018-06-20
  Administered 2018-06-20: 60 mg via INTRAMUSCULAR

## 2018-06-20 MED ORDER — GABAPENTIN 100 MG PO CAPS: 200.0000 mg | ORAL_CAPSULE | Freq: Every day | ORAL | 3 refills | Status: DC

## 2018-06-20 MED ORDER — METHYLPREDNISOLONE ACETATE 80 MG/ML IJ SUSP
80.0000 mg | Freq: Once | INTRAMUSCULAR | Status: AC
  Administered 2018-06-20: 80 mg via INTRAMUSCULAR

## 2018-06-20 MED FILL — predniSONE 50 MG TABS: 50 | 5 days supply | Qty: 5 | Fill #0

## 2018-06-20 MED FILL — GABAPENTIN 100 MG CAPSULE: 100 | 30 days supply | Qty: 60 | Fill #0

## 2018-06-20 NOTE — Assessment & Plan Note (Signed)
C8 radicular symptoms on the right side.  Weakness noted in the C8 distribution.  Deep tendon reflexes though appear to be intact.  Positive Spurling's which is concerning.  Injections given today for pain relief, gabapentin and prednisone given.  Discussed icing regimen and home exercises.  Discussed topical anti-inflammatories.  Follow-up again in 2 weeks to make sure patient is responding appropriately.

## 2018-06-20 NOTE — Patient Instructions (Addendum)
Good to see you  2 injections today  Starting tomorrow prednisone daily for 5 days Gabapentin 100-200mg  at night  Ice 20 minutes 2 times daily. Usually after activity and before bed. Exercises 3 times a week.  See em again in 2 weeks (ok to double book)
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
## 2018-07-02 ENCOUNTER — Encounter: Payer: Self-pay | Admitting: Family Medicine

## 2018-07-02 ENCOUNTER — Ambulatory Visit (INDEPENDENT_AMBULATORY_CARE_PROVIDER_SITE_OTHER): Payer: 59 | Admitting: Family Medicine

## 2018-07-02 DIAGNOSIS — M542 Cervicalgia: Secondary | ICD-10-CM | POA: Diagnosis not present

## 2018-07-02 DIAGNOSIS — M5412 Radiculopathy, cervical region: Secondary | ICD-10-CM

## 2018-07-02 MED ORDER — MELOXICAM 15 MG PO TABS: 15.0000 mg | ORAL_TABLET | Freq: Every day | ORAL | 0 refills | Status: DC

## 2018-07-02 MED FILL — MELOXICAM 15 MG TABLET: 15 | 30 days supply | Qty: 30 | Fill #0

## 2018-07-02 NOTE — Assessment & Plan Note (Signed)
Cervical radiculopathy.  Patient continues to have the same difficulty.  Did not respond to the prednisone as good as what would be anticipated.  Start meloxicam.  Increase gabapentin to 300 mg.  MRI of the neck noted secondary to the continued weakness at this time.  Patient could be a candidate for an epidural injection as well.  Discussed icing regimen and home exercises.  Discussed which activities of doing which wants to avoid.  Follow-up again after the imaging to discuss.

## 2018-07-02 NOTE — Patient Instructions (Signed)
Good to see you  Ice is your friend Meloxicam daily for 10 days then as needed MRI of neck and call [phone]  If bad then will get epidural and then see me again in 3 weeks AFTER

## 2018-07-02 NOTE — Progress Notes (Signed)
[name] [place], [place] [address] Phone: [phone] Subjective:    I Priscilla [name] am serving as a Education administrator for Dr. Hulan Saas.   CC: Neck pain follow-up  [id number]  Priscilla [name] is a 32 y.o. female coming in with complaint of neck pain. States the neck is doing a little better.  Patient was having cervical radiculopathy down the right arm.  Had weakness in the C8 distribution.  Given prednisone and gabapentin.  Making progress.  Very slowly.  Rates the severity of pain as 20% better.  When she was on the prednisone was doing better and now the pain is starting to worsen again.  Has not noticed any significant improvement in these muscle strength.  Doing the exercises occasionally.      Past Medical History:  Diagnosis Date  . Abnormal Pap smear   . Hx of varicella    Past Surgical History:  Procedure Laterality Date  . CESAREAN SECTION N/A 12/22/2012   Procedure: PRIMARY CESAREAN SECTION;  Surgeon: Darlyn Chamber, MD;  Location: [facility];  Service: Obstetrics;  Laterality: N/A;  . COLPOSCOPY     Social History   Socioeconomic History  . Marital status: Married    Spouse name: Not on file  . Number of children: Not on file  . Years of education: Not on file  . Highest education level: Not on file  Occupational History  . Not on file  Social Needs  . Financial resource strain: Not on file  . Food insecurity:    Worry: Not on file    Inability: Not on file  . Transportation needs:    Medical: Not on file    Non-medical: Not on file  Tobacco Use  . Smoking status: Former Smoker    Last attempt to quit: 06/01/2008    Years since quitting: 10.0  . Smokeless tobacco: Never Used  Substance and Sexual Activity  . Alcohol use: Yes    Alcohol/week: 1.0 standard drinks    Types: 1 Glasses of wine per week  . Drug use: No  . Sexual activity: Yes  Lifestyle  . Physical activity:    Days per week: Not on file    Minutes  per session: Not on file  . Stress: Not on file  Relationships  . Social connections:    Talks on phone: Not on file    Gets together: Not on file    Attends religious service: Not on file    Active member of club or organization: Not on file    Attends meetings of clubs or organizations: Not on file    Relationship status: Not on file  Other Topics Concern  . Not on file  Social History Narrative   Married, one 51 mo old son.   Occupation: Museum/gallery exhibitions officer.   No tobacco.  No alc/drugs.   Caffienated drinks-yes   Seat belt use often-yes   Regular Exercise-yes   Smoke alarm in the home-yes   Firearms/guns in the home-yes   History of physical abuse-no                  No Known Allergies Family History  Problem Relation Age of Onset  . Diabetes Mother   . Hyperlipidemia Father   . Hypertension Father   . Diabetes Father   . Cancer Maternal Grandmother        leaukemia  . Stroke Neg Hx   . Early death  Neg Hx   . Kidney disease Neg Hx     Current Outpatient Medications (Endocrine & Metabolic):  .  predniSONE (DELTASONE) 50 MG tablet, Take 1 tablet (50 mg total) by mouth daily.    Current Outpatient Medications (Analgesics):  .  meloxicam (MOBIC) 15 MG tablet, Take 1 tablet (15 mg total) by mouth daily.   Current Outpatient Medications (Other):  .  gabapentin (NEURONTIN) 100 MG capsule, Take 2 capsules (200 mg total) by mouth at bedtime. .  valACYclovir (VALTREX) 1000 MG tablet, Take 2 tablets (2,000 mg total) by mouth 2 (two) times daily. Take over one day.    Past medical history, social, surgical and family history all reviewed in electronic medical record.  No pertanent information unless stated regarding to the chief complaint.   Review of Systems:  No headache, visual changes, nausea, vomiting, diarrhea, constipation, dizziness, abdominal pain, skin rash, fevers, chills, night sweats, weight loss, swollen lymph nodes, body aches, joint swelling, m  chest pain, shortness of breath, mood changes.  Positive muscle aches  Objective  Blood pressure 120/80, pulse 78, height 5\' 2"  (1.575 m), weight 137 lb (62.1 kg), SpO2 99 %.    General: No apparent distress alert and oriented x3 mood and affect normal, dressed appropriately.  HEENT: Pupils equal, extraocular movements intact  Respiratory: Patient's speak in full sentences and does not appear short of breath  Cardiovascular: No lower extremity edema, non tender, no erythema  Skin: Warm dry intact with no signs of infection or rash on extremities or on axial skeleton.  Abdomen: Soft nontender  Neuro: Cranial nerves II through XII are intact, neurovascularly intact in all extremities with 2+ DTRs and 2+ pulses.  Lymph: No lymphadenopathy of posterior or anterior cervical chain or axillae bilaterally.  Gait normal with good balance and coordination.  MSK:  Non tender with full range of motion and good stability and symmetric strength and tone of shoulders, elbows, , hip, knee and ankles bilaterally.  Neck: Inspection mild loss of lordosis. No palpable stepoffs. Positive Spurling's maneuver.  Right side  right side limited side bending and rotation Significant weakness in 3-5 strength still noted in the right side C8 distribution. Reflexes normal   Impression and Recommendations:      The above documentation has been reviewed and is accurate and complete Lyndal Pulley, DO       Note: This dictation was prepared with Dragon dictation along with smaller phrase technology. Any transcriptional errors that result from this process are unintentional.

## 2018-07-06 DIAGNOSIS — L814 Other melanin hyperpigmentation: Secondary | ICD-10-CM | POA: Diagnosis not present

## 2018-07-06 DIAGNOSIS — L918 Other hypertrophic disorders of the skin: Secondary | ICD-10-CM | POA: Diagnosis not present

## 2018-07-06 DIAGNOSIS — D225 Melanocytic nevi of trunk: Secondary | ICD-10-CM | POA: Diagnosis not present

## 2018-07-06 DIAGNOSIS — D224 Melanocytic nevi of scalp and neck: Secondary | ICD-10-CM | POA: Diagnosis not present

## 2018-07-06 DIAGNOSIS — D1801 Hemangioma of skin and subcutaneous tissue: Secondary | ICD-10-CM | POA: Diagnosis not present

## 2018-07-06 DIAGNOSIS — D2272 Melanocytic nevi of left lower limb, including hip: Secondary | ICD-10-CM | POA: Diagnosis not present

## 2018-07-11 ENCOUNTER — Ambulatory Visit
Admission: RE | Admit: 2018-07-11 | Discharge: 2018-07-11 | Disposition: A | Discharge status: Home / Self Care | Payer: 59 | Source: Ambulatory Visit | Attending: Family Medicine | Admitting: Family Medicine

## 2018-07-11 ENCOUNTER — Other Ambulatory Visit: Payer: 59

## 2018-07-11 ENCOUNTER — Encounter: Payer: Self-pay | Admitting: Family Medicine

## 2018-07-11 DIAGNOSIS — M542 Cervicalgia: Secondary | ICD-10-CM

## 2018-07-11 DIAGNOSIS — M4802 Spinal stenosis, cervical region: Secondary | ICD-10-CM | POA: Diagnosis not present

## 2018-07-11 MED ORDER — GABAPENTIN 300 MG PO CAPS: ORAL_CAPSULE | ORAL | 3 refills | Status: DC

## 2018-07-11 MED FILL — GABAPENTIN 300 MG CAPSULE: 300 | 30 days supply | Qty: 30 | Fill #0

## 2018-07-12 ENCOUNTER — Other Ambulatory Visit: Payer: Self-pay

## 2018-07-12 DIAGNOSIS — M5412 Radiculopathy, cervical region: Secondary | ICD-10-CM

## 2018-07-18 ENCOUNTER — Ambulatory Visit: Payer: 59 | Attending: Family Medicine | Admitting: Physical Therapy

## 2018-07-18 ENCOUNTER — Other Ambulatory Visit: Payer: Self-pay

## 2018-07-18 ENCOUNTER — Encounter: Payer: Self-pay | Admitting: Physical Therapy

## 2018-07-18 DIAGNOSIS — M542 Cervicalgia: Secondary | ICD-10-CM | POA: Insufficient documentation

## 2018-07-18 DIAGNOSIS — M6281 Muscle weakness (generalized): Secondary | ICD-10-CM | POA: Diagnosis not present

## 2018-07-18 NOTE — Patient Instructions (Addendum)
Access Code: [email or web address]  URL: [email or web address]  Date: 07/18/2018  Prepared by: Earlie Counts   Exercises  Supine Chin Tuck - 5 reps - 1 sets - 5 sec hold - 1x daily - 7x weekly  Supine Cervical Sidebending Stretch - 5 reps - 1 sets - 1 sec hold - 4x daily - 7x weekly  Ulnar Nerve Flossing - 5 reps - 1 sets - 3x daily - 7x weekly  Patient Education  Cervical Stenosis  Cervical Disk Herniation  Office Posture  [facility] [address] [place], [place] [address] Phone # [phone] Fax [phone]

## 2018-07-18 NOTE — Therapy (Signed)
[facility] [address] [place], [place], [address] Phone: [phone]   Fax:  [phone]  Physical Therapy Evaluation  Patient Details  Name: Priscilla [name] MRN: [id number] Date of Birth: 1986-01-06 Referring Provider (PT): Dr. Olevia Bowens. Tamala Julian   Encounter Date: 07/18/2018  PT End of Session - 07/18/18 1012    Visit Number  1    Date for PT Re-Evaluation  08/29/18    Authorization Type  UMR    PT Start Time  0800    PT Stop Time  0845    PT Time Calculation (min)  45 min    Activity Tolerance  Patient tolerated treatment well    Behavior During Therapy  WFL for tasks assessed/performed       Past Medical History:  Diagnosis Date  . Abnormal Pap smear   . Hx of varicella     Past Surgical History:  Procedure Laterality Date  . CESAREAN SECTION N/A 12/22/2012   Procedure: PRIMARY CESAREAN SECTION;  Surgeon: Darlyn Chamber, MD;  Location: [facility];  Service: Obstetrics;  Laterality: N/A;  . COLPOSCOPY      There were no vitals filed for this visit.   Subjective Assessment - 07/18/18 0804    Subjective  Sudden onset of cervical. No numbness or tingling in right arm.     Patient Stated Goals  avoid surgery    Currently in Pain?  Yes    Pain Score  3     Pain Location  Neck    Pain Orientation  Right    Pain Descriptors / Indicators  Aching;Dull    Pain Type  Acute pain    Pain Onset  More than a month ago    Pain Frequency  Intermittent    Aggravating Factors   laying down flat, lifting above head,     Pain Relieving Factors  laying in recliner    Multiple Pain Sites  No         [facility] PT Assessment - 07/18/18 0001      Assessment   Medical Diagnosis  M54.12 Cervical Radiculopathy    Referring Provider (PT)  Dr. Olevia Bowens. Smith    Onset Date/Surgical Date  05/18/18    Hand Dominance  Right    Prior Therapy  none      Precautions   Precautions  None      Restrictions   Weight Bearing  Restrictions  No      Balance Screen   Has the patient fallen in the past 6 months  No    Has the patient had a decrease in activity level because of a fear of falling?   No    Is the patient reluctant to leave their home because of a fear of falling?   No      Home Film/video editor residence      Prior Function   Level of Independence  Independent    Vocation Requirements  sitting, typing    Leisure  gym 2-3 times per week      Cognition   Overall Cognitive Status  Within Functional Limits for tasks assessed      Observation/Other Assessments   Focus on Therapeutic Outcomes (FOTO)   58% limitation; goal is 35%limitation      Posture/Postural Control   Posture/Postural Control  Postural limitations    Postural Limitations  Rounded Shoulders;Forward head      ROM /  Strength   AROM / PROM / Strength  AROM;PROM;Strength      AROM   Right Shoulder Flexion  130 Degrees    Right Shoulder ABduction  104 Degrees    Left Shoulder Flexion  170 Degrees    Left Shoulder ABduction  140 Degrees    Cervical Flexion  54    Cervical Extension  60    Cervical - Right Side Bend  15    Cervical - Left Side Bend  30    Cervical - Right Rotation  50    Cervical - Left Rotation  50      PROM   Overall PROM   Within functional limits for tasks performed      Strength   Right Shoulder Flexion  3-/5    Right Shoulder ABduction  3-/5    Right Shoulder Internal Rotation  4/5    Right Shoulder External Rotation  4/5    Right Elbow Extension  4/5    Right Wrist Flexion  3+/5    Right Wrist Extension  4/5    Right Hand Grip (lbs)  48    Left Hand Grip (lbs)  55      Palpation   Spinal mobility  decreased mobility of C3-C5    Palpation comment  tenderness located on right side of cervical paraspinals      Special Tests    Special Tests  Cervical      other    Findings  Positive    Side  Right    Comment  ulnar nerve and brachial plexus neural tension stretch                 Objective measurements completed on examination: See above findings.              PT Education - 07/18/18 1008    Education Details  Access Code: [email or web address]     Person(s) Educated  Patient    Methods  Explanation;Demonstration;Verbal cues;Handout    Comprehension  Verbalized understanding;Returned demonstration       PT Short Term Goals - 07/18/18 1051      PT SHORT TERM GOAL #1   Title  independent with initial HEP    Time  3    Period  Weeks    Status  New    Target Date  08/29/18      PT SHORT TERM GOAL #2   Title  right shoulder flexion A/ROM in sitting >/= 160 degrees     Time  3    Period  Weeks    Status  New    Target Date  08/08/18      PT SHORT TERM GOAL #3   Title  right shoulder abduction A/ROM >/= 160 degrees    Time  3    Period  Weeks    Status  New    Target Date  08/08/18      PT SHORT TERM GOAL #4   Title  right grip strength >/= 55 pounds    Time  3    Period  Weeks    Status  New    Target Date  08/08/18        PT Long Term Goals - 07/18/18 1100      PT LONG TERM GOAL #1   Title  independent with HEP and understand how to progress herself    Time  6    Period  Weeks    Status  New    Target Date  08/29/18      PT LONG TERM GOAL #2   Title  right arm strength >/= 4/5 so she is able to lift items with right arm and no increase in cervical pain    Time  6    Period  Weeks    Status  New    Target Date  08/29/18      PT LONG TERM GOAL #3   Title  ability to lay flat in bed due to cervical pain decreased >/= 80%, patient lays in recliner now    Time  6    Period  Weeks    Status  New    Target Date  08/29/18      PT LONG TERM GOAL #4   Title  FOTO score </= 35% limitation    Time  6    Period  Weeks    Status  New    Target Date  08/29/18      PT LONG TERM GOAL #5   Title  raise right arm overhead to place item in overhead cabinet with no increase in cervical pain    Time  6    Period  Weeks     Status  New    Target Date  08/29/18             Plan - 07/18/18 1013    Clinical Impression Statement  Patient is a 32 year old female with sudden onset of cervical pain. Patient reports her cervical pain is 3/10 and unable to reach overhead, lay flat in bed, or lift items. Patient has to sleep in her recliner due to cervical pain. Right shoulder A/ROM for flexion is 130 degrees and abduction 104 degrees. Patient has decreased cervical ROM. Decreased strength in right shoulder, elbow, and wrist. Grip strength on the right is 48 pounds and left is 55 pounds with right hand dominance. Patient has positive neural tension strength for ulnar and brachial pelxus. Patient will benefit from skilled therapy to reduce cervical pain and improve function.     History and Personal Factors relevant to plan of care:  none pertinent    Clinical Presentation  Evolving    Clinical Presentation due to:  unable to reach overhead, work at desk, lift items with right hand, typing at computer    Clinical Decision Making  Low    Rehab Potential  Excellent    Clinical Impairments Affecting Rehab Potential  none    PT Frequency  2x / week    PT Duration  6 weeks    PT Treatment/Interventions  Cryotherapy;Electrical Stimulation;Moist Heat;Traction;Ultrasound;Therapeutic activities;Therapeutic exercise;Neuromuscular re-education;Manual techniques;Patient/family education;Passive range of motion;Dry needling;Taping    PT Next Visit Plan  neural tension stretch, cervical traction, right shoulder A/AROM, cervical sidebending, soft tissue work to cervical    Consulted and Agree with Plan of Care  Patient       Patient will benefit from skilled therapeutic intervention in order to improve the following deficits and impairments:  Increased fascial restricitons, Pain, Decreased mobility, Increased muscle spasms, Decreased activity tolerance, Decreased range of motion, Decreased strength, Impaired flexibility  Visit  Diagnosis: Cervicalgia - Plan: PT plan of care cert/re-cert  Muscle weakness (generalized) - Plan: PT plan of care cert/re-cert     Problem List Patient Active Problem List   Diagnosis Date Noted  . Cervical radiculopathy at C8 06/20/2018  . Herpes labialis without complication 78/67/6720  . Hyperglycemia 11/15/2017  . Routine  general medical examination at a health care facility 06/02/2011    Earlie Counts, PT 07/18/18 11:51 AM   [facility] [address] [place], [place], [address] Phone: [phone]   Fax:  [phone]  Name: LOREY PALLETT MRN: [id number] Date of Birth: 1986-05-07

## 2018-08-06 ENCOUNTER — Encounter: Payer: Self-pay | Admitting: Physical Therapy

## 2018-08-06 ENCOUNTER — Ambulatory Visit: Payer: 59 | Attending: Family Medicine | Admitting: Physical Therapy

## 2018-08-06 DIAGNOSIS — M542 Cervicalgia: Secondary | ICD-10-CM | POA: Diagnosis not present

## 2018-08-06 DIAGNOSIS — M6281 Muscle weakness (generalized): Secondary | ICD-10-CM | POA: Diagnosis not present

## 2018-08-06 NOTE — Therapy (Signed)
[facility] [address] [place], [place], [address] Phone: [phone]   Fax:  [phone]  Physical Therapy Treatment  Patient Details  Name: Priscilla [name] MRN: [id number] Date of Birth: 1986/06/21 Referring Provider (PT): Dr. Olevia Bowens. Tamala Julian   Encounter Date: 08/06/2018  PT End of Session - 08/06/18 1455    Visit Number  2    Date for PT Re-Evaluation  08/29/18    Authorization Type  UMR    PT Start Time  1453    PT Stop Time  1540    PT Time Calculation (min)  47 min    Activity Tolerance  Patient tolerated treatment well    Behavior During Therapy  WFL for tasks assessed/performed       Past Medical History:  Diagnosis Date  . Abnormal Pap smear   . Hx of varicella     Past Surgical History:  Procedure Laterality Date  . CESAREAN SECTION N/A 12/22/2012   Procedure: PRIMARY CESAREAN SECTION;  Surgeon: Darlyn Chamber, MD;  Location: [facility];  Service: Obstetrics;  Laterality: N/A;  . COLPOSCOPY      There were no vitals filed for this visit.  Subjective Assessment - 08/06/18 1456    Subjective  I am a lot better from my exercises. Currently no pain/symptoms.    Currently in Pain?  --    Multiple Pain Sites  No                       [facility] Adult PT Treatment/Exercise - 08/06/18 0001      Self-Care   Self-Care  --   Putting the lap top higher at work, sleeping positions/align     Moist Heat Therapy   Number Minutes Moist Heat  15 Minutes    Moist Heat Location  Cervical      Electrical Stimulation   Electrical Stimulation Location  Cervical    Electrical Stimulation Action  IFC    Electrical Stimulation Parameters  80-150 Hz   pt hooklying   Electrical Stimulation Goals  Tone;Pain      Manual Therapy   Soft tissue mobilization  Cervical, Bil SCM ( TP noted RT) upper traps bil,     Passive ROM  Cervical Rotation Bil 10x, static stretch               PT Short Term  Goals - 07/18/18 1051      PT SHORT TERM GOAL #1   Title  independent with initial HEP    Time  3    Period  Weeks    Status  New    Target Date  08/29/18      PT SHORT TERM GOAL #2   Title  right shoulder flexion A/ROM in sitting >/= 160 degrees     Time  3    Period  Weeks    Status  New    Target Date  08/08/18      PT SHORT TERM GOAL #3   Title  right shoulder abduction A/ROM >/= 160 degrees    Time  3    Period  Weeks    Status  New    Target Date  08/08/18      PT SHORT TERM GOAL #4   Title  right grip strength >/= 55 pounds    Time  3    Period  Weeks    Status  New  Target Date  08/08/18        PT Long Term Goals - 07/18/18 1100      PT LONG TERM GOAL #1   Title  independent with HEP and understand how to progress herself    Time  6    Period  Weeks    Status  New    Target Date  08/29/18      PT LONG TERM GOAL #2   Title  right arm strength >/= 4/5 so she is able to lift items with right arm and no increase in cervical pain    Time  6    Period  Weeks    Status  New    Target Date  08/29/18      PT LONG TERM GOAL #3   Title  ability to lay flat in bed due to cervical pain decreased >/= 80%, patient lays in recliner now    Time  6    Period  Weeks    Status  New    Target Date  08/29/18      PT LONG TERM GOAL #4   Title  FOTO score </= 35% limitation    Time  6    Period  Weeks    Status  New    Target Date  08/29/18      PT LONG TERM GOAL #5   Title  raise right arm overhead to place item in overhead cabinet with no increase in cervical pain    Time  6    Period  Weeks    Status  New    Target Date  08/29/18            Plan - 08/06/18 1455    Clinical Impression Statement  Pt feeling much better after her evaluation and doing her HEP. She reports independence and compliance with HEP. Pt remains with decreased cervical rotation. Soft tissue wise her SCM bilaterally were tight with active TP more RT than LT. She may be a dry  needling candidate for this? She currently is using a lap top at work that is very low. We discussed raising the lap top up so she does not have to look down so much, which might be why SCM are so tight. We did not do mechanical traction as there was no radicular symtomology today but did try Estim and heat at the end of session to help inhibit her cervical muscles. This helped per her report at the end of session.     Rehab Potential  Excellent    Clinical Impairments Affecting Rehab Potential  none    PT Frequency  2x / week    PT Duration  6 weeks    PT Treatment/Interventions  Cryotherapy;Electrical Stimulation;Moist Heat;Traction;Ultrasound;Therapeutic activities;Therapeutic exercise;Neuromuscular re-education;Manual techniques;Patient/family education;Passive range of motion;Dry needling;Taping    PT Next Visit Plan  Pt might be interested in Dry Needling to cervical, check RT SCM. Give pt stretch for SCM ( forgot to give today) manual techniques for cervical AROM    Consulted and Agree with Plan of Care  Patient       Patient will benefit from skilled therapeutic intervention in order to improve the following deficits and impairments:  Increased fascial restricitons, Pain, Decreased mobility, Increased muscle spasms, Decreased activity tolerance, Decreased range of motion, Decreased strength, Impaired flexibility  Visit Diagnosis: Cervicalgia  Muscle weakness (generalized)     Problem List Patient Active Problem List   Diagnosis Date Noted  . Cervical  radiculopathy at C8 06/20/2018  . Herpes labialis without complication 87/18/3672  . Hyperglycemia 11/15/2017  . Routine general medical examination at a health care facility 06/02/2011    [facility], PTA 08/06/2018, 4:20 PM  [facility] [address], [place], [place], [address] Phone: [phone]   Fax:  [phone]  Name: Priscilla [name] MRN:  [id number] Date of Birth: 03/27/1986

## 2018-08-08 ENCOUNTER — Ambulatory Visit: Payer: 59 | Admitting: Physical Therapy

## 2018-08-08 ENCOUNTER — Encounter: Payer: Self-pay | Admitting: Physical Therapy

## 2018-08-08 DIAGNOSIS — M542 Cervicalgia: Secondary | ICD-10-CM | POA: Diagnosis not present

## 2018-08-08 DIAGNOSIS — M6281 Muscle weakness (generalized): Secondary | ICD-10-CM | POA: Diagnosis not present

## 2018-08-08 NOTE — Patient Instructions (Signed)
Access Code: [email or web address]  URL: [email or web address]  Date: 08/08/2018  Prepared by: Earlie Counts   Exercises  Supine Chin Tuck - 5 reps - 1 sets - 5 sec hold - 5x daily - 7x weekly  Supine Cervical Sidebending Stretch - 5 reps - 1 sets - 1 sec hold - 5x daily - 7x weekly  Ulnar Nerve Flossing - 5 reps - 1 sets - 3x daily - 7x weekly  Patient Education  Cervical Stenosis  Cervical Disk Herniation  Office Posture  Forward Head Posture  Trigger Point Dry Needling  Trigger Point Dry Needling  Trigger Point Dry Needling [facility] [address], [place], [place] [address] Phone # [phone] Fax [phone]

## 2018-08-08 NOTE — Therapy (Signed)
[facility] [address] [place], [place], [address] Phone: [phone]   Fax:  [phone]  Physical Therapy Treatment  Patient Details  Name: Priscilla [name] MRN: [id number] Date of Birth: 12-01-1985 Referring Provider (PT): Dr. Olevia Bowens. Tamala Julian   Encounter Date: 08/08/2018  PT End of Session - 08/08/18 1151    Visit Number  3    Date for PT Re-Evaluation  08/29/18    Authorization Type  UMR    PT Start Time  0930    PT Stop Time  1030    PT Time Calculation (min)  60 min    Activity Tolerance  Patient tolerated treatment well;No increased pain    Behavior During Therapy  WFL for tasks assessed/performed       Past Medical History:  Diagnosis Date  . Abnormal Pap smear   . Hx of varicella     Past Surgical History:  Procedure Laterality Date  . CESAREAN SECTION N/A 12/22/2012   Procedure: PRIMARY CESAREAN SECTION;  Surgeon: Darlyn Chamber, MD;  Location: [facility];  Service: Obstetrics;  Laterality: N/A;  . COLPOSCOPY      There were no vitals filed for this visit.  Subjective Assessment - 08/08/18 0934    Subjective  When I do too much I can tell I am not 100%.     Patient Stated Goals  avoid surgery    Currently in Pain?  Yes    Pain Score  2     Pain Location  Neck    Pain Orientation  Right    Pain Descriptors / Indicators  Aching;Dull    Pain Type  Acute pain    Pain Radiating Towards  radiates down right arm    Pain Onset  More than a month ago    Pain Frequency  Intermittent    Aggravating Factors   laying down flat, lay on left side with head sidebend to the left, lifting above head    Pain Relieving Factors  laying on couch with head propped up    Multiple Pain Sites  No                       [facility] Adult PT Treatment/Exercise - 08/08/18 0001      Neck Exercises: Supine   Neck Retraction  5 reps;5 secs    Lateral Flexion  Right;5 reps   keeping head retracted; use right  hand     Modalities   Modalities  Electrical Stimulation;Moist Heat      Moist Heat Therapy   Number Minutes Moist Heat  15 Minutes    Moist Heat Location  Cervical      Electrical Stimulation   Electrical Stimulation Location  Cervical    Electrical Stimulation Action  IFC    Electrical Stimulation Parameters  To patient tolerance, 15 min    Electrical Stimulation Goals  Tone;Pain      Manual Therapy   Manual Therapy  Soft tissue mobilization;Joint mobilization;Neural Stretch    Joint Mobilization  sideglide to C3-C7 bil., manual cervical retraction off the bed to improve centralization of pain    Soft tissue mobilization  cervical paraspinals, subocciptials, upper trap, scalenes    Neural Stretch  right brachial plexus, right ulnar nerve       Trigger Point Dry Needling - 08/08/18 0947    Consent Given?  Yes    Education Handout Provided  Yes    Muscles  Treated Upper Body  Sternocleidomastoid;Upper trapezius   right   Sternocleidomastoid Response  Twitch response elicited;Palpable increased muscle length    Upper Trapezius Response  Twitch reponse elicited;Palpable increased muscle length           PT Education - 08/08/18 1014    Education Details  Access Code: [email or web address]     Person(s) Educated  Patient    Methods  Explanation;Demonstration;Verbal cues;Handout    Comprehension  Returned demonstration;Verbalized understanding       PT Short Term Goals - 08/08/18 1155      PT SHORT TERM GOAL #1   Title  independent with initial HEP    Time  3    Period  Weeks    Status  On-going      PT SHORT TERM GOAL #2   Title  right shoulder flexion A/ROM in sitting >/= 160 degrees     Time  3    Period  Weeks    Status  On-going      PT SHORT TERM GOAL #3   Title  right shoulder abduction A/ROM >/= 160 degrees    Time  3    Period  Weeks    Status  On-going      PT SHORT TERM GOAL #4   Title  right grip strength >/= 55 pounds    Time  3    Period  Weeks     Status  On-going        PT Long Term Goals - 07/18/18 1100      PT LONG TERM GOAL #1   Title  independent with HEP and understand how to progress herself    Time  6    Period  Weeks    Status  New    Target Date  08/29/18      PT LONG TERM GOAL #2   Title  right arm strength >/= 4/5 so she is able to lift items with right arm and no increase in cervical pain    Time  6    Period  Weeks    Status  New    Target Date  08/29/18      PT LONG TERM GOAL #3   Title  ability to lay flat in bed due to cervical pain decreased >/= 80%, patient lays in recliner now    Time  6    Period  Weeks    Status  New    Target Date  08/29/18      PT LONG TERM GOAL #4   Title  FOTO score </= 35% limitation    Time  6    Period  Weeks    Status  New    Target Date  08/29/18      PT LONG TERM GOAL #5   Title  raise right arm overhead to place item in overhead cabinet with no increase in cervical pain    Time  6    Period  Weeks    Status  New    Target Date  08/29/18            Plan - 08/08/18 1031    Clinical Impression Statement  Patient is having less pain in right arm. Patient did not have any right shoulder and arm pain with therapy. Patient had trigger points in the right upper trapezius. Patient had tightness in C3-C5 with joint mobilizaiton. After therapy there was full right cervical sidebending P/ROM. Patient was able to lay  her head flat in therapy. Patient still sleeps on her couch with her head propped up on the armrest with a pillow for comfort. Patient will benefit from skilled therapy to reduce her pain and improve her function.     Rehab Potential  Excellent    Clinical Impairments Affecting Rehab Potential  none    PT Frequency  2x / week    PT Duration  6 weeks    PT Treatment/Interventions  Cryotherapy;Electrical Stimulation;Moist Heat;Traction;Ultrasound;Therapeutic activities;Therapeutic exercise;Neuromuscular re-education;Manual techniques;Patient/family  education;Passive range of motion;Dry needling;Taping    PT Next Visit Plan  assess  Dry Needling to cervical, working on [employer] with retraction and right sidebending; next will work with cervical extension, manual techniques for cervical AROM; supine shoulder flexion with chin tuck; supine with shoulder ER with yellow theraband; right wrist flexion and extension with weight    PT Home Exercise Plan  Access Code: [email or web address]     Recommended Other Services  MD signed initial note    Consulted and Agree with Plan of Care  Patient       Patient will benefit from skilled therapeutic intervention in order to improve the following deficits and impairments:  Increased fascial restricitons, Pain, Decreased mobility, Increased muscle spasms, Decreased activity tolerance, Decreased range of motion, Decreased strength, Impaired flexibility  Visit Diagnosis: Cervicalgia  Muscle weakness (generalized)     Problem List Patient Active Problem List   Diagnosis Date Noted  . Cervical radiculopathy at C8 06/20/2018  . Herpes labialis without complication 66/29/4765  . Hyperglycemia 11/15/2017  . Routine general medical examination at a health care facility 06/02/2011    Earlie Counts, PT 08/08/18 12:01 PM   [facility] [address], [place], [place], [address] Phone: [phone]   Fax:  [phone]  Name: Priscilla [name] MRN: [id number] Date of Birth: Aug 02, 1985

## 2018-08-09 ENCOUNTER — Encounter: Payer: Self-pay | Admitting: Plastic Surgery

## 2018-08-09 ENCOUNTER — Ambulatory Visit (INDEPENDENT_AMBULATORY_CARE_PROVIDER_SITE_OTHER): Payer: Self-pay | Admitting: Plastic Surgery

## 2018-08-09 DIAGNOSIS — Z719 Counseling, unspecified: Secondary | ICD-10-CM

## 2018-08-09 NOTE — Progress Notes (Addendum)
Botulinum Toxin Procedure Note  Procedure: Cosmetic botulinum toxin   Pre-operative Diagnosis: Dynamic rhytides   Post-operative Diagnosis: Same  Complications:  None  Brief history: The patient desires botulinum toxin injection of her forehead. I discussed with the patient this proposed procedure of botulinum toxin injections, which is customized depending on the particular needs of the patient. It is performed on facial rhytids as a temporary correction. The alternatives were discussed with the patient. The risks were addressed including bleeding, scarring, infection, damage to deeper structures, asymmetry, and chronic pain, which may occur infrequently after a procedure. The individual's choice to undergo a surgical procedure is based on the comparison of risks to potential benefits. Other risks include unsatisfactory results, brow ptosis, eyelid ptosis, allergic reaction, temporary paralysis, which should go away with time, bruising, blurring disturbances and delayed healing. Botulinum toxin injections do not arrest the aging process or produce permanent tightening of the eyelid.  Operative intervention maybe necessary to maintain the results of a blepharoplasty or botulinum toxin. The patient understands and wishes to proceed. An informed consent was signed and informational brochures given to her prior to the procedure.  Procedure: The area was prepped with alcohol and dried with a clean gauze. Using a clean technique, the botulinum toxin was diluted with 1.25 cc of preservative-free normal saline which was slowly injected with an 18 gauge needle in a tuberculin syringes.  A 32 gauge needles were then used to inject the botulinum toxin. This mixture allow for an aliquot of 5 units per 0.1 cc in each injection site.    Subsequently the mixture was injected in the glabellar and forehead area with preservation of the temporal branch to the lateral eyebrow as well as into each lateral canthal area  beginning from the lateral orbital rim medial to the zygomaticus major in 3 separate areas. A total of 20 Units of botulinum toxin was used. The forehead and glabellar area was injected with care to inject intramuscular only while holding pressure on the supratrochlear vessels in each area during each injection on either side of the medial corrugators. The injection proceeded vertically superiorly to the medial 2/3 of the frontalis muscle and superior 2/3 of the lateral frontalis, again with preservation of the frontal branch.  No complications were noted. Light pressure was held for 5 minutes. She was instructed explicitly in post-operative care.  Botox LOT:  [id number] C2 EXP:  5/22

## 2018-08-13 ENCOUNTER — Encounter: Payer: Self-pay | Admitting: Physical Therapy

## 2018-08-13 ENCOUNTER — Ambulatory Visit: Payer: 59 | Admitting: Physical Therapy

## 2018-08-13 DIAGNOSIS — M6281 Muscle weakness (generalized): Secondary | ICD-10-CM | POA: Diagnosis not present

## 2018-08-13 DIAGNOSIS — M542 Cervicalgia: Secondary | ICD-10-CM

## 2018-08-13 NOTE — Therapy (Signed)
[facility] [address], [place], [place], [address] Phone: [phone]   Fax:  [phone]  Physical Therapy Treatment  Patient Details  Name: Priscilla [name] MRN: [id number] Date of Birth: 11-21-1985 Referring Provider (PT): Dr. Olevia Bowens. Tamala Julian   Encounter Date: 08/13/2018  PT End of Session - 08/13/18 1538    Visit Number  4    Date for PT Re-Evaluation  08/29/18    Authorization Type  UMR    PT Start Time  1535    PT Stop Time  1625    PT Time Calculation (min)  50 min    Activity Tolerance  Patient tolerated treatment well;No increased pain    Behavior During Therapy  WFL for tasks assessed/performed       Past Medical History:  Diagnosis Date  . Abnormal Pap smear   . Hx of varicella     Past Surgical History:  Procedure Laterality Date  . CESAREAN SECTION N/A 12/22/2012   Procedure: PRIMARY CESAREAN SECTION;  Surgeon: Darlyn Chamber, MD;  Location: [facility];  Service: Obstetrics;  Laterality: N/A;  . COLPOSCOPY      There were no vitals filed for this visit.  Subjective Assessment - 08/13/18 1540    Subjective  Did great with needling. Just a little soreness that night.     Currently in Pain?  No/denies    Multiple Pain Sites  No                       [facility] Adult PT Treatment/Exercise - 08/13/18 0001      Moist Heat Therapy   Number Minutes Moist Heat  15 Minutes    Moist Heat Location  Cervical      Electrical Stimulation   Electrical Stimulation Location  Cervical    Electrical Stimulation Action  IFC    Electrical Stimulation Parameters  80-150 HZ    Electrical Stimulation Goals  Tone;Pain      Manual Therapy   Soft tissue mobilization  cervical paraspinals, subocciptials, upper trap, scalenes, RT SCM   Trigger point release methods            PT Education - 08/13/18 1616    Education Details  HEP static cervical stretches for scalenes, SCM, and upper traps    Person(s) Educated  Patient    Methods  Explanation;Demonstration;Tactile cues;Verbal cues;Handout   Pt performed 2x 10-20 sec of each stretch   Comprehension  Returned demonstration;Verbalized understanding       PT Short Term Goals - 08/13/18 1613      PT SHORT TERM GOAL #1   Title  independent with initial HEP    Time  3    Period  Weeks    Status  Achieved        PT Long Term Goals - 07/18/18 1100      PT LONG TERM GOAL #1   Title  independent with HEP and understand how to progress herself    Time  6    Period  Weeks    Status  New    Target Date  08/29/18      PT LONG TERM GOAL #2   Title  right arm strength >/= 4/5 so she is able to lift items with right arm and no increase in cervical pain    Time  6    Period  Weeks    Status  New    Target  Date  08/29/18      PT LONG TERM GOAL #3   Title  ability to lay flat in bed due to cervical pain decreased >/= 80%, patient lays in recliner now    Time  6    Period  Weeks    Status  New    Target Date  08/29/18      PT LONG TERM GOAL #4   Title  FOTO score </= 35% limitation    Time  6    Period  Weeks    Status  New    Target Date  08/29/18      PT LONG TERM GOAL #5   Title  raise right arm overhead to place item in overhead cabinet with no increase in cervical pain    Time  6    Period  Weeks    Status  New    Target Date  08/29/18            Plan - 08/13/18 1610    Clinical Impression Statement  Pt arrives reporting having signicicanlty less pain and better range of motion. She reports the dry needling was very helpful for her rotation. She has also reconfigured her work station and moved her laptop higher so she is not looking down all day. trigger points still present, RT >LT SCM and scalene group mainly. Gave static stretches for HEP addition/progression. Pt  reports checking her blind spot and reaching overhead are all signifiicantly improved.     Rehab Potential  Excellent    Clinical Impairments  Affecting Rehab Potential  none    PT Frequency  2x / week    PT Duration  6 weeks    PT Treatment/Interventions  Cryotherapy;Electrical Stimulation;Moist Heat;Traction;Ultrasound;Therapeutic activities;Therapeutic exercise;Neuromuscular re-education;Manual techniques;Patient/family education;Passive range of motion;Dry needling;Taping    PT Next Visit Plan  Asses goals specifically, review new stretches, DN if seen by PT, begin posterior shoulder/ upper back strength and endurance exercises that will flow into next HEP.     PT Home Exercise Plan  Access Code: [email or web address]     Consulted and Agree with Plan of Care  Patient       Patient will benefit from skilled therapeutic intervention in order to improve the following deficits and impairments:  Increased fascial restricitons, Pain, Decreased mobility, Increased muscle spasms, Decreased activity tolerance, Decreased range of motion, Decreased strength, Impaired flexibility  Visit Diagnosis: Cervicalgia  Muscle weakness (generalized)     Problem List Patient Active Problem List   Diagnosis Date Noted  . Cervical radiculopathy at C8 06/20/2018  . Herpes labialis without complication 93/26/7124  . Hyperglycemia 11/15/2017  . Encounter for counseling 06/02/2011    Pegeen Stiger, PTA 08/13/2018, 4:26 PM  [facility] [address] [place], [place], [address] Phone: [phone]   Fax:  [phone]  Name: Priscilla [name] MRN: [id number] Date of Birth: 02-Aug-1985  Access Code: [email or web address]  URL: [email or web address]  Date: 08/13/2018  Prepared by: Myrene Galas   Exercises  Supine Chin Tuck - 5 reps - 1 sets - 5 sec hold - 5x daily - 7x weekly  Supine Cervical Sidebending Stretch - 5 reps - 1 sets - 1 sec hold - 5x daily - 7x weekly  Ulnar Nerve Flossing - 5 reps - 1 sets - 3x daily - 7x weekly  Seated Cervical Sidebending Stretch - 2 reps - 1 sets - 20  hold - 3x daily - 7x weekly  Seated Levator Scapulae  Stretch - 2 reps - 1 sets - 20 hold - 3x daily - 7x weekly  Sternocleidomastoid Stretch - 2 reps - 1 sets - 20 hold - 3x daily - 7x weekly  Patient Education  Cervical Stenosis  Cervical Disk Herniation  Office Posture  Forward Head Posture  Trigger Point Dry Needling

## 2018-08-15 ENCOUNTER — Encounter: Payer: Self-pay | Admitting: Plastic Surgery

## 2018-08-15 ENCOUNTER — Encounter: Payer: Self-pay | Admitting: Physical Therapy

## 2018-08-15 ENCOUNTER — Ambulatory Visit: Payer: 59 | Admitting: Physical Therapy

## 2018-08-15 DIAGNOSIS — M542 Cervicalgia: Secondary | ICD-10-CM | POA: Diagnosis not present

## 2018-08-15 DIAGNOSIS — M6281 Muscle weakness (generalized): Secondary | ICD-10-CM | POA: Diagnosis not present

## 2018-08-15 NOTE — Progress Notes (Signed)
No heavy lifting or massage to face.

## 2018-08-15 NOTE — Therapy (Signed)
[facility] [address] [place], [place], [address] Phone: [phone]   Fax:  [phone]  Physical Therapy Treatment  Patient Details  Name: KERRY-ANNE MEZO MRN: [id number] Date of Birth: 11/18/1985 Referring Provider (PT): Dr. Olevia Bowens. Tamala Julian   Encounter Date: 08/15/2018  PT End of Session - 08/15/18 0940    Visit Number  5    Date for PT Re-Evaluation  08/29/18    Authorization Type  UMR    PT Start Time  0936    PT Stop Time  1030    PT Time Calculation (min)  54 min    Activity Tolerance  Patient tolerated treatment well;No increased pain    Behavior During Therapy  WFL for tasks assessed/performed       Past Medical History:  Diagnosis Date  . Abnormal Pap smear   . Hx of varicella     Past Surgical History:  Procedure Laterality Date  . CESAREAN SECTION N/A 12/22/2012   Procedure: PRIMARY CESAREAN SECTION;  Surgeon: Darlyn Chamber, MD;  Location: [facility];  Service: Obstetrics;  Laterality: N/A;  . COLPOSCOPY      There were no vitals filed for this visit.  Subjective Assessment - 08/15/18 1018    Subjective  I am 70% better.     Currently in Pain?  No/denies                       [facility] Adult PT Treatment/Exercise - 08/15/18 0001      Moist Heat Therapy   Number Minutes Moist Heat  15 Minutes    Moist Heat Location  Cervical      Electrical Stimulation   Electrical Stimulation Location  Cervical    Electrical Stimulation Action  IFC    Electrical Stimulation Parameters  80-150 HZ    Electrical Stimulation Goals  Tone      Manual Therapy   Soft tissue mobilization  cervical paraspinals, subocciptials, upper trap, scalenes, RT SCM   Little to no palpable trigger points today     Neck Exercises: Stretches   Upper Trapezius Stretch  Right;Left;2 reps;20 seconds    Levator Stretch  Right;Left;2 reps;20 seconds    Other Neck Stretches  SCM stetch bil 1x 10 sec              PT Education - 08/15/18 0941    Education Details  Yellow band ER, horizontal abd, diagonals BIl for HEP addition: issue dband for HEP   Pt correctly perfromed 10x of each exercise   Person(s) Educated  Patient    Methods  Explanation;Demonstration;Tactile cues;Verbal cues;Handout    Comprehension  Returned demonstration;Verbalized understanding       PT Short Term Goals - 08/13/18 1613      PT SHORT TERM GOAL #1   Title  independent with initial HEP    Time  3    Period  Weeks    Status  Achieved        PT Long Term Goals - 07/18/18 1100      PT LONG TERM GOAL #1   Title  independent with HEP and understand how to progress herself    Time  6    Period  Weeks    Status  New    Target Date  08/29/18      PT LONG TERM GOAL #2   Title  right arm strength >/= 4/5 so she is able to lift  items with right arm and no increase in cervical pain    Time  6    Period  Weeks    Status  New    Target Date  08/29/18      PT LONG TERM GOAL #3   Title  ability to lay flat in bed due to cervical pain decreased >/= 80%, patient lays in recliner now    Time  6    Period  Weeks    Status  New    Target Date  08/29/18      PT LONG TERM GOAL #4   Title  FOTO score </= 35% limitation    Time  6    Period  Weeks    Status  New    Target Date  08/29/18      PT LONG TERM GOAL #5   Title  raise right arm overhead to place item in overhead cabinet with no increase in cervical pain    Time  6    Period  Weeks    Status  New    Target Date  08/29/18            Plan - 08/15/18 0942    Clinical Impression Statement  Pt arrives reporting 70% improvement since evaluation. She feels the stretches have been very helpful thoughout her work day and is compliant with her entire HEP. Today we added postural strength and endurance yellow band exercises to advance HEP. Challenging to keep her neck muscles soft during the band exercises. PTA noted very little trigger points in  her cervical musclualture when compared with last session.     Rehab Potential  Excellent    Clinical Impairments Affecting Rehab Potential  none    PT Frequency  2x / week    PT Duration  6 weeks    PT Treatment/Interventions  Cryotherapy;Electrical Stimulation;Moist Heat;Traction;Ultrasound;Therapeutic activities;Therapeutic exercise;Neuromuscular re-education;Manual techniques;Patient/family education;Passive range of motion;Dry needling;Taping    PT Next Visit Plan  Goal review, review band exercises and see if pt is ready for red band and/or progression to other more advanced posterior shoulder/scapular endurance exercises.     PT Home Exercise Plan  Access Code: [email or web address]     Consulted and Agree with Plan of Care  Patient       Patient will benefit from skilled therapeutic intervention in order to improve the following deficits and impairments:  Increased fascial restricitons, Pain, Decreased mobility, Increased muscle spasms, Decreased activity tolerance, Decreased range of motion, Decreased strength, Impaired flexibility  Visit Diagnosis: Cervicalgia  Muscle weakness (generalized)     Problem List Patient Active Problem List   Diagnosis Date Noted  . Cervical radiculopathy at C8 06/20/2018  . Herpes labialis without complication 62/70/3500  . Hyperglycemia 11/15/2017  . Encounter for counseling 06/02/2011    ,, PTA 08/15/2018, 10:30 AM  [facility] [address], [place], [place], [address] Phone: [phone]   Fax:  [phone]  Name: ARIKA MAINER MRN: [id number] Date of Birth: 1985/10/21  Access Code: [email or web address]  URL: [email or web address]  Date: 08/15/2018  Prepared by: Myrene Galas   Exercises  Supine Chin Tuck - 5 reps - 1 sets - 5 sec hold - 5x daily - 7x weekly  Supine Cervical Sidebending Stretch - 5 reps - 1 sets - 1 sec hold - 5x daily - 7x weekly  Ulnar  Nerve Flossing - 5 reps - 1 sets - 3x daily - 7x weekly  Seated Cervical  Sidebending Stretch - 2 reps - 1 sets - 20 hold - 3x daily - 7x weekly  Seated Levator Scapulae Stretch - 2 reps - 1 sets - 20 hold - 3x daily - 7x weekly  Sternocleidomastoid Stretch - 2 reps - 1 sets - 20 hold - 3x daily - 7x weekly  Shoulder External Rotation with Resistance - 10 reps - 2 sets - 1x daily - 7x weekly  Shoulder Horizontal Abduction with Resistance on Swiss Ball - 10 reps - 2 sets - 1x daily - 7x weekly  Seated Shoulder Diagonal Pulls with Resistance - 10 reps - 2 sets - 1x daily - 7x weekly  Patient Education  Cervical Stenosis  Cervical Disk Herniation  Office Posture  Forward Head Posture  Trigger Point Dry Needling

## 2018-08-16 ENCOUNTER — Ambulatory Visit (INDEPENDENT_AMBULATORY_CARE_PROVIDER_SITE_OTHER): Payer: Self-pay | Admitting: Plastic Surgery

## 2018-08-16 ENCOUNTER — Encounter: Payer: Self-pay | Admitting: Plastic Surgery

## 2018-08-16 DIAGNOSIS — Z719 Counseling, unspecified: Secondary | ICD-10-CM

## 2018-08-16 NOTE — Progress Notes (Signed)
Preoperative Dx: angioma of lower lip  Postoperative Dx:  same  Procedure: laser to lower lip angioma   Anesthesia: none  Description of Procedure:  Risks and complications were explained to the patient. Consent was confirmed and signed. Time out was called and all information was confirmed to be correct. The area  area was prepped with alcohol and wiped dry. The NdYag 1064 nm and 300 Joules. The lip was lasered. The patient tolerated the procedure well and there were no complications. The patient is to follow up in 4 weeks.

## 2018-08-20 ENCOUNTER — Encounter: Payer: 59 | Admitting: Physical Therapy

## 2018-08-20 MED FILL — GABAPENTIN 300 MG CAPSULE: 300 | 30 days supply | Qty: 30 | Fill #1

## 2018-08-20 MED FILL — valACYclovir HCL 1 GM TABS: 1 | 1 days supply | Qty: 4 | Fill #1

## 2018-08-22 ENCOUNTER — Ambulatory Visit: Payer: 59 | Admitting: Physical Therapy

## 2018-08-22 ENCOUNTER — Encounter: Payer: Self-pay | Admitting: Physical Therapy

## 2018-08-22 DIAGNOSIS — M6281 Muscle weakness (generalized): Secondary | ICD-10-CM | POA: Diagnosis not present

## 2018-08-22 DIAGNOSIS — M542 Cervicalgia: Secondary | ICD-10-CM

## 2018-08-22 NOTE — Therapy (Signed)
[facility] [address], [place], [place], [address] Phone: [phone]   Fax:  [phone]  Physical Therapy Treatment  Patient Details  Name: Priscilla [name] MRN: [id number] Date of Birth: [birth year] Referring Provider (PT): Dr. Olevia Bowens. [name] Date: 08/22/2018  PT End of Session - 08/22/18 0933    Visit Number  6    Date for PT Re-Evaluation  08/29/18    Authorization Type  UMR    PT Start Time  0933    PT Stop Time  1030    PT Time Calculation (min)  57 min    Activity Tolerance  Patient tolerated treatment well;No increased pain    Behavior During Therapy  WFL for tasks assessed/performed       Past Medical History:  Diagnosis Date  . Abnormal Pap smear   . Hx of varicella     Past Surgical History:  Procedure Laterality Date  . CESAREAN SECTION N/A 12/22/2012   Procedure: PRIMARY CESAREAN SECTION;  Surgeon: Darlyn Chamber, MD;  Location: [facility];  Service: Obstetrics;  Laterality: N/A;  . COLPOSCOPY      There were no vitals filed for this visit.  Subjective Assessment - 08/22/18 0935    Subjective  The band work was challenging. " I felt it."    Currently in Pain?  No/denies    Multiple Pain Sites  No         [facility] PT Assessment - 08/22/18 0001      AROM   Right Shoulder Flexion  153 Degrees    Right Shoulder ABduction  150 Degrees    Cervical - Right Side Bend  25      Strength   Right Hand Grip (lbs)  60                   [facility] Adult PT Treatment/Exercise - 08/22/18 0001      Shoulder Exercises: Supine   Other Supine Exercises  Decompression on soft foam roll, then thoracic extension 10x over ball       Shoulder Exercises: Seated   Horizontal ABduction  Strengthening;Both;20 reps;Theraband    Theraband Level (Shoulder Horizontal ABduction)  Level 2 (Red)    External Rotation  Strengthening;Both;20 reps;Theraband    Theraband Level (Shoulder External  Rotation)  Level 2 (Red)    Diagonals  Strengthening;Both;10 reps;Theraband    Theraband Level (Shoulder Diagonals)  Level 2 (Red)      Moist Heat Therapy   Number Minutes Moist Heat  15 Minutes    Moist Heat Location  Cervical   post session     Manual Therapy   Soft tissue mobilization  cervical paraspinals, subocciptials, upper trap, scalenes, RT SCM   worked on 1 TP in [place] upper trap            PT Education - 08/22/18 0948    Education Details  Overhead RT shoulder flexion stretching on the wall for HEP    Person(s) Educated  Patient    Methods  Explanation;Demonstration;Verbal cues    Comprehension  Verbalized understanding;Returned demonstration       PT Short Term Goals - 08/22/18 0951      PT SHORT TERM GOAL #2   Title  right shoulder flexion A/ROM in sitting >/= 160 degrees     Time  3    Period  Weeks    Status  On-going   153 today  PT SHORT TERM GOAL #3   Title  right shoulder abduction A/ROM >/= 160 degrees    Time  3    Period  Weeks    Status  On-going   150 degrees     PT SHORT TERM GOAL #4   Title  right grip strength >/= 55 pounds    Time  3    Period  Weeks    Status  Achieved   60#       PT Long Term Goals - 08/22/18 2111      PT LONG TERM GOAL #3   Title  ability to lay flat in bed due to cervical pain decreased >/= 80%, patient lays in recliner now    Time  6    Period  Weeks    Status  --   Have not tried yet. Will try this week.           Plan - 08/22/18 0951    Clinical Impression Statement  Pt arrives reporting she feels still about 70% improved. Both grip strength and her cervical AROm have improved. She met grip STG and made good progress towards the ROM goals but did not meet yet. Gave pt overhead Rt shoulder stretch up the wall for HEP today, she can do this during the day at work. Pt is compliant withher band exercises and upgraded her to red band today.      Rehab Potential  Excellent    Clinical Impairments  Affecting Rehab Potential  none    PT Frequency  2x / week    PT Duration  6 weeks    PT Treatment/Interventions  Cryotherapy;Electrical Stimulation;Moist Heat;Traction;Ultrasound;Therapeutic activities;Therapeutic exercise;Neuromuscular re-education;Manual techniques;Patient/family education;Passive range of motion;Dry needling;Taping    PT Next Visit Plan  Continue with postural strength and endurance, measure RT shoulder ROM.     PT Home Exercise Plan  Access Code: [email or web address]     Consulted and Agree with Plan of Care  Patient       Patient will benefit from skilled therapeutic intervention in order to improve the following deficits and impairments:  Increased fascial restricitons, Pain, Decreased mobility, Increased muscle spasms, Decreased activity tolerance, Decreased range of motion, Decreased strength, Impaired flexibility  Visit Diagnosis: Cervicalgia  Muscle weakness (generalized)     Problem List Patient Active Problem List   Diagnosis Date Noted  . Cervical radiculopathy at C8 06/20/2018  . Herpes labialis without complication 33/61/2244  . Hyperglycemia 11/15/2017  . Encounter for counseling 06/02/2011    Myrene Galas, PTA 08/22/2018, 4:05 PM  [facility] [address], [place], [place], [address] Phone: [phone]   Fax:  [phone]  Name: Priscilla [name] MRN: [id number] Date of Birth: October 10, 1985

## 2018-08-24 ENCOUNTER — Encounter: Payer: Self-pay | Admitting: Physical Therapy

## 2018-08-24 ENCOUNTER — Ambulatory Visit: Payer: 59 | Admitting: Physical Therapy

## 2018-08-24 DIAGNOSIS — M542 Cervicalgia: Secondary | ICD-10-CM

## 2018-08-24 DIAGNOSIS — M6281 Muscle weakness (generalized): Secondary | ICD-10-CM | POA: Diagnosis not present

## 2018-08-24 NOTE — Therapy (Signed)
[facility] [address] [place], [place], [address] Phone: [phone]   Fax:  [phone]  Physical Therapy Treatment  Patient Details  Name: Priscilla [name] MRN: [id number] Date of Birth: [birth year] Referring Provider (PT): Dr. Olevia Bowens. [name] Date: 08/24/2018  PT End of Session - 08/24/18 0848    Visit Number  7    Date for PT Re-Evaluation  08/29/18    Authorization Type  [facility]    PT Start Time  0845    PT Stop Time  0940    PT Time Calculation (min)  55 min    Activity Tolerance  Patient tolerated treatment well;No increased pain    Behavior During Therapy  WFL for tasks assessed/performed       Past Medical History:  Diagnosis Date  . Abnormal Pap smear   . Hx of varicella     Past Surgical History:  Procedure Laterality Date  . CESAREAN SECTION N/A 12/22/2012   Procedure: PRIMARY CESAREAN SECTION;  Surgeon: Darlyn Chamber, MD;  Location: [facility];  Service: Obstetrics;  Laterality: N/A;  . COLPOSCOPY      There were no vitals filed for this visit.  Subjective Assessment - 08/24/18 0849    Subjective  Doing really well at work.     Currently in Pain?  No/denies                       [facility] Adult PT Treatment/Exercise - 08/24/18 0001      Shoulder Exercises: Supine   Horizontal ABduction  --   2x 10 with red band on foam roll, VC to relax neck   Flexion  --   10x on foam roll using cane   Diagonals  --   10x redband on foam roll, Bil     Shoulder Exercises: Seated   Other Seated Exercises  Sitting on green ball: yellow band RT delotid lifts in 3 directions 10x    VC to engage her mid back a little     Shoulder Exercises: ROM/Strengthening   UBE (Upper Arm Bike)  Sitting on green ball 2x2 L3       Shoulder Exercises: Stretch   Other Shoulder Stretches  Thoracic extension stretches over the foam roll 10x    Performed at 2 different levels     Moist Heat Therapy   Number Minutes Moist Heat  15 Minutes    Moist Heat Location  Cervical   post session     Manual Therapy   Soft tissue mobilization  cervical paraspinals, subocciptials, upper trap, scalenes, RT SCM   worked on 1 TP in Rt upper trap & RT scalene            PT Education - 08/24/18 0910    Education Details  Sitting on ball at home for deltoid lifts with yellow band     Person(s) Educated  Patient    Methods  Explanation;Demonstration;Tactile cues;Verbal cues    Comprehension  Returned demonstration;Verbalized understanding       PT Short Term Goals - 08/22/18 0951      PT SHORT TERM GOAL #2   Title  right shoulder flexion A/ROM in sitting >/= 160 degrees     Time  3    Period  Weeks    Status  On-going   153 today     PT SHORT TERM GOAL #3   Title  right shoulder abduction A/ROM >/=  160 degrees    Time  3    Period  Weeks    Status  On-going   150 degrees     PT SHORT TERM GOAL #4   Title  right grip strength >/= 55 pounds    Time  3    Period  Weeks    Status  Achieved   60#       PT Long Term Goals - 08/22/18 9450      PT LONG TERM GOAL #3   Title  ability to lay flat in bed due to cervical pain decreased >/= 80%, patient lays in recliner now    Time  6    Period  Weeks    Status  --   Have not tried yet. Will try this week.           Plan - 08/24/18 0848    Clinical Impression Statement  Pt arrives pain free today. She reports stretching throughout the work day has been very beneficial. She is compliant with her new HEP and ordered a soft foam roll for home use.  Reviewed and performed her band exercises ans shoulder/thoracic stretches on the roll today. Pt is going to purchase the [employer] MEth[employer] book to use at home to have some prottocols to use. Remians with stubborn TP in Rt upper trap and lateral neck.     Rehab Potential  Excellent    Clinical Impairments Affecting Rehab Potential  none    PT Frequency  2x / week    PT Duration  6 weeks     PT Treatment/Interventions  Cryotherapy;Electrical Stimulation;Moist Heat;Traction;Ultrasound;Therapeutic activities;Therapeutic exercise;Neuromuscular re-education;Manual techniques;Patient/family education;Passive range of motion;Dry needling;Taping    PT Next Visit Plan  Continue with postural strength and endurance. Work on foam roll since pt has purchased one.     PT Home Exercise Plan  Access Code: [email or web address]     Consulted and Agree with Plan of Care  Patient       Patient will benefit from skilled therapeutic intervention in order to improve the following deficits and impairments:  Increased fascial restricitons, Pain, Decreased mobility, Increased muscle spasms, Decreased activity tolerance, Decreased range of motion, Decreased strength, Impaired flexibility  Visit Diagnosis: Cervicalgia  Muscle weakness (generalized)     Problem List Patient Active Problem List   Diagnosis Date Noted  . Cervical radiculopathy at C8 06/20/2018  . Herpes labialis without complication 34/91/7915  . Hyperglycemia 11/15/2017  . Encounter for counseling 06/02/2011    COCHRAN,JENNIFER, PTA 08/24/2018, 9:32 AM  [facility] [address] [place], [place], [address] Phone: [phone]   Fax:  [phone]  Name: HAWA HENLY MRN: [id number] Date of Birth: Mar 19, 1986

## 2018-08-27 ENCOUNTER — Encounter: Payer: Self-pay | Admitting: Physical Therapy

## 2018-08-27 ENCOUNTER — Ambulatory Visit: Payer: 59 | Admitting: Physical Therapy

## 2018-08-27 DIAGNOSIS — M542 Cervicalgia: Secondary | ICD-10-CM

## 2018-08-27 DIAGNOSIS — M6281 Muscle weakness (generalized): Secondary | ICD-10-CM

## 2018-08-27 NOTE — Therapy (Addendum)
[facility] [address] [place], [place], [address] Phone: [phone]   Fax:  [phone]  Physical Therapy Treatment  Patient Details  Name: CASILDA PICKERILL MRN: [id number] Date of Birth: 1986-02-08 Referring Provider (PT): Dr. Olevia Bowens. [name] Date: 08/27/2018    Past Medical History:  Diagnosis Date  . Abnormal Pap smear   . Hx of varicella     Past Surgical History:  Procedure Laterality Date  . CESAREAN SECTION N/A 12/22/2012   Procedure: PRIMARY CESAREAN SECTION;  Surgeon: Darlyn Chamber, MD;  Location: [facility];  Service: Obstetrics;  Laterality: N/A;  . COLPOSCOPY      There were no vitals filed for this visit.                              PT Short Term Goals - 08/29/18 0936      PT SHORT TERM GOAL #1   Title  independent with initial HEP    Time  3    Period  Weeks    Status  Achieved      PT SHORT TERM GOAL #2   Title  right shoulder flexion A/ROM in sitting >/= 160 degrees     Time  3    Period  Weeks    Status  Achieved      PT SHORT TERM GOAL #3   Title  right shoulder abduction A/ROM >/= 160 degrees    Time  3    Period  Weeks    Status  Achieved      PT SHORT TERM GOAL #4   Title  right grip strength >/= 55 pounds    Time  3    Period  Weeks    Status  Achieved        PT Long Term Goals - 08/29/18 6144      PT LONG TERM GOAL #1   Title  independent with HEP and understand how to progress herself    Time  6    Period  Weeks    Status  Achieved      PT LONG TERM GOAL #2   Title  right arm strength >/= 4/5 so she is able to lift items with right arm and no increase in cervical pain    Time  6    Period  Weeks    Status  Achieved      PT LONG TERM GOAL #3   Title  ability to lay flat in bed due to cervical pain decreased >/= 80%, patient lays in recliner now    Time  6    Period  Weeks    Status  Achieved      PT LONG TERM GOAL  #4   Title  FOTO score </= 35% limitation    Time  6    Period  Weeks    Status  Achieved      PT LONG TERM GOAL #5   Title  raise right arm overhead to place item in overhead cabinet with no increase in cervical pain    Time  6    Period  Weeks    Status  Achieved              Patient will benefit from skilled therapeutic intervention in order to improve the following deficits and impairments:  Increased fascial restricitons, Pain, Decreased mobility, Increased muscle  spasms, Decreased activity tolerance, Decreased range of motion, Decreased strength, Impaired flexibility  Visit Diagnosis: Cervicalgia  Muscle weakness (generalized)     Problem List Patient Active Problem List   Diagnosis Date Noted  . Cervical radiculopathy at C8 06/20/2018  . Herpes labialis without complication 97/84/7841  . Hyperglycemia 11/15/2017  . Encounter for counseling 06/02/2011    Jasn Xia, PTA 08/29/2018, 2:46 PM  [facility] [address] [place], [place], [address] Phone: [phone]   Fax:  [phone]  Name: YMANI PORCHER MRN: [id number] Date of Birth: May 10, 1986

## 2018-08-29 ENCOUNTER — Encounter: Payer: Self-pay | Admitting: Physical Therapy

## 2018-08-29 ENCOUNTER — Ambulatory Visit: Payer: 59 | Admitting: Physical Therapy

## 2018-08-29 DIAGNOSIS — M542 Cervicalgia: Secondary | ICD-10-CM | POA: Diagnosis not present

## 2018-08-29 DIAGNOSIS — M6281 Muscle weakness (generalized): Secondary | ICD-10-CM

## 2018-08-29 NOTE — Therapy (Signed)
[facility] [address] [place], [place], [address] Phone: [phone]   Fax:  [phone]  Physical Therapy Treatment  Patient Details  Name: Priscilla [name] MRN: [id number] Date of Birth: 14-Apr-1986 Referring Provider (PT): Dr. Olevia Bowens. Priscilla [name] Date: 08/29/2018  PT End of Session - 08/29/18 0935    Visit Number  9    Date for PT Re-Evaluation  08/29/18    Authorization Type  [facility]R    PT Start Time  0935    PT Stop Time  1005    PT Time Calculation (min)  30 min    Activity Tolerance  Patient tolerated treatment well;No increased pain    Behavior During Therapy  WFL for tasks assessed/performed       Past Medical History:  Diagnosis Date  . Abnormal Pap smear   . Hx of varicella     Past Surgical History:  Procedure Laterality Date  . CESAREAN SECTION N/A 12/22/2012   Procedure: PRIMARY CESAREAN SECTION;  Surgeon: Darlyn Chamber, MD;  Location: [facility];  Service: Obstetrics;  Laterality: N/A;  . COLPOSCOPY      There were no vitals filed for this visit.  Subjective Assessment - 08/29/18 0936    Subjective  I feel good and feel like it could be my last visit.     Patient Stated Goals  avoid surgery    Currently in Pain?  No/denies         [facility] PT Assessment - 08/29/18 0001      Assessment   Medical Diagnosis  M54.12 Cervical Radiculopathy    Referring Provider (PT)  Dr. Olevia Bowens. Smith    Onset Date/Surgical Date  05/18/18    Hand Dominance  Right      Precautions   Precautions  None      Home Environment   Living Environment  Private residence      Prior Function   Level of Independence  Independent      Cognition   Overall Cognitive Status  Within Functional Limits for tasks assessed      Observation/Other Assessments   Focus on Therapeutic Outcomes (FOTO)   21% limitation      Posture/Postural Control   Posture/Postural Control  No significant limitations      AROM   Right Shoulder Flexion  160 Degrees    Right Shoulder ABduction  170 Degrees    Cervical Flexion  60    Cervical Extension  60    Cervical - Right Side Bend  50    Cervical - Left Side Bend  40    Cervical - Right Rotation  75    Cervical - Left Rotation  85      Strength   Right Shoulder Flexion  4+/5    Right Shoulder ABduction  5/5    Right Shoulder Internal Rotation  5/5    Right Shoulder External Rotation  5/5    Right Elbow Extension  5/5    Right Wrist Flexion  5/5    Right Wrist Extension  5/5    Right Hand Grip (lbs)  62                   [facility] Adult PT Treatment/Exercise - 08/29/18 0001      Exercises   Exercises  Other Exercises    Other Exercises   verbally reviewed HEP and patient feels good about her exercises  Manual Therapy   Manual Therapy  Soft tissue mobilization    Soft tissue mobilization  cervical paraspinals, upper trap, rhomboids, and levator scapula       Trigger Point Dry Needling - 08/29/18 1006    Consent Given?  Yes    Muscles Treated Upper Body  Upper trapezius;Levator scapulae;Rhomboids   right   Upper Trapezius Response  Twitch reponse elicited;Palpable increased muscle length    Levator Scapulae Response  Twitch response elicited;Palpable increased muscle length    Rhomboids Response  Twitch response elicited;Palpable increased muscle length           PT Education - 08/29/18 1008    Education Details  verbally reviewed HEP     Person(s) Educated  Patient    Methods  Explanation    Comprehension  Verbalized understanding       PT Short Term Goals - 08/29/18 0936      PT SHORT TERM GOAL #1   Title  independent with initial HEP    Time  3    Period  Weeks    Status  Achieved      PT SHORT TERM GOAL #2   Title  right shoulder flexion A/ROM in sitting >/= 160 degrees     Time  3    Period  Weeks    Status  Achieved      PT SHORT TERM GOAL #3   Title  right shoulder abduction A/ROM >/= 160 degrees    Time   3    Period  Weeks    Status  Achieved      PT SHORT TERM GOAL #4   Title  right grip strength >/= 55 pounds    Time  3    Period  Weeks    Status  Achieved        PT Long Term Goals - 08/29/18 5364      PT LONG TERM GOAL #1   Title  independent with HEP and understand how to progress herself    Time  6    Period  Weeks    Status  Achieved      PT LONG TERM GOAL #2   Title  right arm strength >/= 4/5 so she is able to lift items with right arm and no increase in cervical pain    Time  6    Period  Weeks    Status  Achieved      PT LONG TERM GOAL #3   Title  ability to lay flat in bed due to cervical pain decreased >/= 80%, patient lays in recliner now    Time  6    Period  Weeks    Status  Achieved      PT LONG TERM GOAL #4   Title  FOTO score </= 35% limitation    Time  6    Period  Weeks    Status  Achieved      PT LONG TERM GOAL #5   Title  raise right arm overhead to place item in overhead cabinet with no increase in cervical pain    Time  6    Period  Weeks    Status  Achieved            Plan - 08/29/18 1009    Clinical Impression Statement  Patient has met all of her goals. Patient has full cervical and right shoulder ROM. Patient right upper extremity strength is 4-5/5 strength. Patient is  now able to lay flat in her bed. Patient is not experiencing pain. Patient is back to her full workout at the gym. Patient is able to work a 8 hour day with minimal discomfort at the end of the day. Patient is independent with her HEP. Patient is ready for discharge.     Rehab Potential  Excellent    Clinical Impairments Affecting Rehab Potential  none    PT Treatment/Interventions  Cryotherapy;Electrical Stimulation;Moist Heat;Traction;Ultrasound;Therapeutic activities;Therapeutic exercise;Neuromuscular re-education;Manual techniques;Patient/family education;Passive range of motion;Dry needling;Taping    PT Next Visit Plan  Discharge to HEP this visit    PT Home  Exercise Plan  Access Code: [email or web address]     Consulted and Agree with Plan of Care  Patient       Patient will benefit from skilled therapeutic intervention in order to improve the following deficits and impairments:  Increased fascial restricitons, Pain, Decreased mobility, Increased muscle spasms, Decreased activity tolerance, Decreased range of motion, Decreased strength, Impaired flexibility  Visit Diagnosis: Cervicalgia  Muscle weakness (generalized)     Problem List Patient Active Problem List   Diagnosis Date Noted  . Cervical radiculopathy at C8 06/20/2018  . Herpes labialis without complication 34/19/6222  . Hyperglycemia 11/15/2017  . Encounter for counseling 06/02/2011    Earlie Counts, PT 08/29/18 10:13 AM   [facility] [address] [place], [place], [address] Phone: [phone]   Fax:  [phone]  Name: Priscilla [name] MRN: [id number] Date of Birth: [birth year]  PHYSICAL THERAPY DISCHARGE SUMMARY  Visits from Start of Care: 9  Current functional level related to goals / functional outcomes: See above.   Remaining deficits: See above.    Education / Equipment: HEP Plan: Patient agrees to discharge.  Patient goals were met. Patient is being discharged due to meeting the stated rehab goals.  Thank you for the referral. Earlie Counts, PT 08/29/18 10:13 AM  ?????

## 2018-09-24 MED FILL — GABAPENTIN 300 MG CAPSULE: 300 | 30 days supply | Qty: 30 | Fill #2

## 2018-09-25 ENCOUNTER — Telehealth: Payer: 59 | Admitting: Physician Assistant

## 2018-09-25 DIAGNOSIS — J069 Acute upper respiratory infection, unspecified: Secondary | ICD-10-CM

## 2018-09-25 DIAGNOSIS — B9789 Other viral agents as the cause of diseases classified elsewhere: Secondary | ICD-10-CM

## 2018-09-25 NOTE — Progress Notes (Signed)
We are sorry you are not feeling well.  Here is how we plan to help!  Based on what you have shared with me, it looks like you may have a viral upper respiratory infection or a "common cold".  Colds are caused by a large number of viruses; however, rhinovirus is the most common cause.   Symptoms of the common cold vary from person to person, with common symptoms including sore throat, cough, and malaise.  A low-grade fever of 100.4 may present, but is often uncommon.  Symptoms vary however, and are closely related to a person's age or underlying illnesses.  The most common symptoms associated with the common cold are nasal discharge or congestion, cough, sneezing, headache and pressure in the ears and face.  Cold symptoms usually persist for about 3 to 10 days, but can last up to 2 weeks.  It is important to know that colds do not cause serious illness or complications in most cases.    The common cold is transmitted from person to person, with the most common method of transmission being a person's hands.  The virus is able to live on the skin and can infect other persons for up to 2 hours after direct contact.  Also, colds are transmitted when someone coughs or sneezes; thus, it is important to cover the mouth to reduce this risk.  To keep the spread of the common cold at [place], good hand hygiene is very important.  This is an infection that is most likely caused by a virus. There are no specific treatments for the common cold other than to help you with the symptoms until the infection runs its course.    For nasal congestion, you may use an oral decongestants such as Mucinex D or if you have glaucoma or high blood pressure use plain Mucinex.  Saline nasal spray or nasal drops can help and can safely be used as often as needed for congestion.  For your congestion, I have prescribed Ipratropium Bromide nasal spray 0.03% two sprays in each nostril 2-3 times a day  If you do not have a history of heart  disease, hypertension, diabetes or thyroid disease, prostate/bladder issues or glaucoma, you may also use Sudafed to treat nasal congestion.  It is highly recommended that you consult with a pharmacist or your primary care physician to ensure this medication is safe for you to take.     If you have a cough, you may use cough suppressants such as Delsym and Robitussin.  If you have glaucoma or high blood pressure, you can also use Coricidin HBP.   For cough I have prescribed for you A prescription cough medication called Tessalon Perles 100 mg. You may take 1-2 capsules every 8 hours as needed for cough  If you have a sore or scratchy throat, use a saltwater gargle-  to  teaspoon of salt dissolved in a 4-ounce to 8-ounce glass of warm water.  Gargle the solution for approximately 15-30 seconds and then spit.  It is important not to swallow the solution.  You can also use throat lozenges/cough drops and Chloraseptic spray to help with throat pain or discomfort.  Warm or cold liquids can also be helpful in relieving throat pain.  For headache, pain or general discomfort, you can use Ibuprofen or Tylenol as directed.   Some authorities believe that zinc sprays or the use of Echinacea may shorten the course of your symptoms.   HOME CARE Only take medications as instructed  by your medical team. Be sure to drink plenty of fluids. Water is fine as well as fruit juices, sodas and electrolyte beverages. You may want to stay away from caffeine or alcohol. If you are nauseated, try taking small sips of liquids. How do you know if you are getting enough fluid? Your urine should be a pale yellow or almost colorless. Get rest. Taking a steamy shower or using a humidifier may help nasal congestion and ease sore throat pain. You can place a towel over your head and breathe in the steam from hot water coming from a faucet. Using a saline nasal spray works much the same way. Cough drops, hard candies and sore throat  lozenges may ease your cough. Avoid close contacts especially the very young and the elderly Cover your mouth if you cough or sneeze Always remember to wash your hands.   GET HELP RIGHT AWAY IF: You develop worsening fever. If your symptoms do not improve within 10 days You develop yellow or green discharge from your nose over 3 days. You have coughing fits You develop a severe head ache or visual changes. You develop shortness of breath, difficulty breathing or start having chest pain  Your symptoms persist after you have completed your treatment plan  MAKE SURE YOU  Understand these instructions. Will watch your condition. Will get help right away if you are not doing well or get worse.  Your e-visit answers were reviewed by a board certified advanced clinical practitioner to complete your personal care plan. Depending upon the condition, your plan could have included both over the counter or prescription medications. Please review your pharmacy choice. If there is a problem, you may call our nursing hot line at and have the prescription routed to another pharmacy. Your safety is important to [place]. If you have drug allergies check your prescription carefully.   You can use MyChart to ask questions about today's visit, request a non-urgent call back, or ask for a work or school excuse for 24 hours related to this e-Visit. If it has been greater than 24 hours you will need to follow up with your provider, or enter a new e-Visit to address those concerns. You will get an e-mail in the next two days asking about your experience.  I hope that your e-visit has been valuable and will speed your recovery. Thank you for using e-visits.      ===View-only below this line===   ----- Message -----    From: Laren Everts    Sent: 09/25/2018  1:42 PM EST      To: E-Visit Mailing List Subject: E-Visit Submission: Cough  E-Visit Submission: Cough --------------------------------  Question:  How long have you been coughing? Answer:   5 days  Question: How would you describe the cough? Answer:   A cough from congested lungs  Question: How often are you coughing? Answer:   Constantly  Question: Does the cough prevent you from sleeping at night? Answer:   Yes  Question: What other symptoms have you experienced with the cough? Answer:   Runny nose            Blocked sinuses            Sore throat            Headache            Wheezing  Question: Do you have a fever? Answer:   No, I do not have a fever  Question: Describe  your sore throat: Answer:   Scratchy sore throat worse with cough  Question: How long have you had a sore throat? Answer:   3 days  Question: Do you have any tenderness or swelling in your neck? Answer:   No  Question: Are you coughing up any mucus? Answer:   I am coughing up a lot of mucus  Question: Do you use a maintenance inhaler? Answer:   No  Question: Do you use a rescue inhaler (such as Ventolin?) Answer:   No  Question: Have you previously required a prescription for prednisone for cough? Answer:   No  Question: Are you diabetic? Answer:   No  Question: Are you pregnant? Answer:   I am confident that I am not pregnant  Question: Are you breastfeeding? Answer:   No  Question: What is the appearance of the mucus? Answer:   The mucus has changed from thin to thick            The mucus has changed from clear to colored  Question: Do you have any of the following? Answer:   Loss of appetite  Question: Do you smoke? Answer:   No  Question: Have you ever smoked? Answer:   I smoked in the past, but quit  Question: Are there people you know with similar symptoms? Answer:   No  Question: Are you experiencing any of the following? Answer:   Coughing more when lying  Question: Are you having difficulty breathing? Answer:   Yes  Question: Please describe what kind of difficulty you are having breathing. Answer:   Im  having trouble taking a depth breath due to the congestion and cough  Question: Is your coughing worse when you are exposed to pollen, dust, or other things in the environment? Answer:   I dont know  Question: Have you been treated for a similar cough in the past? Answer:   No  Question: Have you ever been diagnosed with asthma, bronchitis, or lung disease? Answer:   No  Question: Have you recently started on any medications for your heart or for blood pressure? Answer:   No  Question: Have you recently been hospitalized? Answer:   No  Question: Please list your medication allergies that you may have ? (If 'none' , please list as 'none') Answer:   None  Question: Please list any additional comments  Answer:   My cough is getting worse and the mucus is very thick, always worse when laying down due to drainage. My nose is running but I dont have sinus pressure, all the congeation seems to be in my chest making it hard to take a depth breath  A total of 5-10 minutes was spent evaluating this patients questionnaire and formulating a plan of care.

## 2018-11-21 ENCOUNTER — Encounter: Payer: 59 | Admitting: Internal Medicine

## 2018-11-23 ENCOUNTER — Ambulatory Visit (INDEPENDENT_AMBULATORY_CARE_PROVIDER_SITE_OTHER): Payer: Self-pay | Admitting: Plastic Surgery

## 2018-11-23 ENCOUNTER — Encounter: Payer: Self-pay | Admitting: Plastic Surgery

## 2018-11-23 ENCOUNTER — Other Ambulatory Visit: Payer: Self-pay

## 2018-11-23 DIAGNOSIS — Z719 Counseling, unspecified: Secondary | ICD-10-CM

## 2018-11-23 NOTE — Progress Notes (Signed)
Botulinum Toxin Procedure Note  Procedure: Cosmetic botulinum toxin  Pre-operative Diagnosis: Dynamic rhytides   Post-operative Diagnosis: Same  Complications:  None  Brief history: The patient desires botulinum toxin injection of her forehead. I discussed with the patient this proposed procedure of botulinum toxin injections, which is customized depending on the particular needs of the patient. It is performed on facial rhytids as a temporary correction. The alternatives were discussed with the patient. The risks were addressed including bleeding, scarring, infection, damage to deeper structures, asymmetry, and chronic pain, which may occur infrequently after a procedure. The individual's choice to undergo a surgical procedure is based on the comparison of risks to potential benefits. Other risks include unsatisfactory results, brow ptosis, eyelid ptosis, allergic reaction, temporary paralysis, which should go away with time, bruising, blurring disturbances and delayed healing. Botulinum toxin injections do not arrest the aging process or produce permanent tightening of the eyelid.  Operative intervention maybe necessary to maintain the results of a blepharoplasty or botulinum toxin. The patient understands and wishes to proceed. An informed consent was signed and informational brochures given to her prior to the procedure.  Procedure: The area was prepped with alcohol and dried with a clean gauze. Using a clean technique, the botulinum toxin was diluted with 1.25 cc of preservative-free normal saline which was slowly injected with an 18 gauge needle in a tuberculin syringes.  A 32 gauge needles were then used to inject the botulinum toxin. This mixture allow for an aliquot of 5 units per 0.1 cc in each injection site.    Subsequently the mixture was injected in the glabellar and forehead area with preservation of the temporal branch to the lateral eyebrow as well as into each lateral canthal area  beginning from the lateral orbital rim medial to the zygomaticus major in 3 separate areas. A total of 20 Units of botulinum toxin was used. The forehead and glabellar area was injected with care to inject intramuscular only while holding pressure on the supratrochlear vessels in each area during each injection on either side of the medial corrugators. The injection proceeded vertically superiorly to the medial 2/3 of the frontalis muscle and superior 2/3 of the lateral frontalis, again with preservation of the frontal branch.   No complications were noted. Light pressure was held for 5 minutes. She was instructed explicitly in post-operative care.  Botox LOT:  [id number] C2 EXP:  6/22

## 2018-11-29 ENCOUNTER — Encounter: Payer: Self-pay | Admitting: Internal Medicine

## 2018-11-29 ENCOUNTER — Other Ambulatory Visit: Payer: Self-pay

## 2018-11-29 ENCOUNTER — Ambulatory Visit (INDEPENDENT_AMBULATORY_CARE_PROVIDER_SITE_OTHER): Payer: 59 | Admitting: Internal Medicine

## 2018-11-29 VITALS — BP 118/74 | HR 81 | Temp 98.1°F | Resp 16 | Ht 62.0 in | Wt 139.0 lb

## 2018-11-29 DIAGNOSIS — Z0000 Encounter for general adult medical examination without abnormal findings: Secondary | ICD-10-CM | POA: Insufficient documentation

## 2018-11-29 NOTE — Patient Instructions (Signed)
Preventive Care 18-39 Years, Female Preventive care refers to lifestyle choices and visits with your health care provider that can promote health and wellness. What does preventive care include?   A yearly physical exam. This is also called an annual well check.  Dental exams once or twice a year.  Routine eye exams. Ask your health care provider how often you should have your eyes checked.  Personal lifestyle choices, including: ? Daily care of your teeth and gums. ? Regular physical activity. ? Eating a healthy diet. ? Avoiding tobacco and drug use. ? Limiting alcohol use. ? Practicing safe sex. ? Taking vitamin and mineral supplements as recommended by your health care provider. What happens during an annual well check? The services and screenings done by your health care provider during your annual well check will depend on your age, overall health, lifestyle risk factors, and family history of disease. Counseling Your health care provider may ask you questions about your:  Alcohol use.  Tobacco use.  Drug use.  Emotional well-being.  Home and relationship well-being.  Sexual activity.  Eating habits.  Work and work environment.  Method of birth control.  Menstrual cycle.  Pregnancy history. Screening You may have the following tests or measurements:  Height, weight, and BMI.  Diabetes screening. This is done by checking your blood sugar (glucose) after you have not eaten for a while (fasting).  Blood pressure.  Lipid and cholesterol levels. These may be checked every 5 years starting at age 20.  Skin check.  Hepatitis C blood test.  Hepatitis B blood test.  Sexually transmitted disease (STD) testing.  BRCA-related cancer screening. This may be done if you have a family history of breast, ovarian, tubal, or peritoneal cancers.  Pelvic exam and Pap test. This may be done every 3 years starting at age 21. Starting at age 30, this may be done every 5  years if you have a Pap test in combination with an HPV test. Discuss your test results, treatment options, and if necessary, the need for more tests with your health care provider. Vaccines Your health care provider may recommend certain vaccines, such as:  Influenza vaccine. This is recommended every year.  Tetanus, diphtheria, and acellular pertussis (Tdap, Td) vaccine. You may need a Td booster every 10 years.  Varicella vaccine. You may need this if you have not been vaccinated.  HPV vaccine. If you are 26 or younger, you may need three doses over 6 months.  Measles, mumps, and rubella (MMR) vaccine. You may need at least one dose of MMR. You may also need a second dose.  Pneumococcal 13-valent conjugate (PCV13) vaccine. You may need this if you have certain conditions and were not previously vaccinated.  Pneumococcal polysaccharide (PPSV23) vaccine. You may need one or two doses if you smoke cigarettes or if you have certain conditions.  Meningococcal vaccine. One dose is recommended if you are age 19-21 years and a first-year college student living in a residence hall, or if you have one of several medical conditions. You may also need additional booster doses.  Hepatitis A vaccine. You may need this if you have certain conditions or if you travel or work in places where you may be exposed to hepatitis A.  Hepatitis B vaccine. You may need this if you have certain conditions or if you travel or work in places where you may be exposed to hepatitis B.  Haemophilus influenzae type b (Hib) vaccine. You may need this if you   have certain risk factors. Talk to your health care provider about which screenings and vaccines you need and how often you need them. This information is not intended to replace advice given to you by your health care provider. Make sure you discuss any questions you have with your health care provider. Document Released: 09/13/2001 Document Revised: 02/28/2017  Document Reviewed: 05/19/2015 Elsevier Interactive Patient Education  2019 [employer].

## 2018-11-29 NOTE — Progress Notes (Signed)
Subjective:  Patient ID: Priscilla [name], female    DOB: 07-Apr-1986  Age: 33 y.o. MRN: [id number]  CC: Annual Exam   HPI Priscilla [name] presents for a CPX.   She has a history of cervical radiculopathy but tells me she has not had any recent episodes of neck pain or upper extremity paresthesias.  She is no longer taking anything for the discomfort.  She has an occasional cold sore which is adequately treated with as needed doses of valacyclovir.  She feels well today and offers no complaints.  Outpatient Medications Prior to Visit  Medication Sig Dispense Refill  . gabapentin (NEURONTIN) 300 MG capsule nightly 30 capsule 3  . levonorgestrel (MIRENA) 20 MCG/24HR IUD 1 each by Intrauterine route once.    . valACYclovir (VALTREX) 1000 MG tablet Take 2 tablets (2,000 mg total) by mouth 2 (two) times daily. Take over one day. 4 tablet 3  . meloxicam (MOBIC) 15 MG tablet Take 1 tablet (15 mg total) by mouth daily. 30 tablet 0   No facility-administered medications prior to visit.     ROS Review of Systems  Constitutional: Negative for diaphoresis, fatigue and unexpected weight change.  HENT: Negative.   Respiratory: Negative for cough.   Cardiovascular: Negative for chest pain.  Gastrointestinal: Negative for abdominal pain.  Endocrine: Negative.   Genitourinary: Negative.  Negative for difficulty urinating.  Musculoskeletal: Negative for arthralgias and neck pain.  Skin: Negative.  Negative for color change and rash.  Allergic/Immunologic: Negative.   Neurological: Negative.   Hematological: Negative for adenopathy. Does not bruise/bleed easily.  Psychiatric/Behavioral: Negative.     Objective:  BP 118/74 (BP Location: Left Arm, Patient Position: Sitting, Cuff Size: Normal)   Pulse 81   Temp 98.1 F (36.7 C) (Oral)   Resp 16   Ht 5\' 2"  (1.575 m)   Wt 139 lb (63 kg)   LMP 11/22/2018   SpO2 99%   BMI 25.42 kg/m   BP Readings from Last 3 Encounters:  11/29/18 118/74   07/02/18 120/80  06/20/18 108/80    Wt Readings from Last 3 Encounters:  11/29/18 139 lb (63 kg)  07/02/18 137 lb (62.1 kg)  06/20/18 136 lb (61.7 kg)    Physical Exam Vitals signs reviewed.  Constitutional:      Appearance: She is not ill-appearing or diaphoretic.  HENT:     Nose: Nose normal.     Mouth/Throat:     Mouth: Mucous membranes are moist.     Pharynx: No oropharyngeal exudate.  Eyes:     General: No scleral icterus.    Conjunctiva/sclera: Conjunctivae normal.  Neck:     Musculoskeletal: Normal range of motion. No neck rigidity or muscular tenderness.  Cardiovascular:     Rate and Rhythm: Normal rate and regular rhythm.     Heart sounds: No murmur.  Pulmonary:     Effort: Pulmonary effort is normal. No respiratory distress.     Breath sounds: No stridor. No wheezing or rhonchi.  Abdominal:     General: Abdomen is flat. Bowel sounds are normal. There is no distension.     Palpations: There is no hepatomegaly, splenomegaly or mass.     Tenderness: There is no abdominal tenderness.     Hernia: No hernia is present.  Musculoskeletal: Normal range of motion.        General: No swelling.     Right lower leg: No edema.     Left lower leg: No edema.  Lymphadenopathy:     Cervical: No cervical adenopathy.  Skin:    General: Skin is warm and dry.     Coloration: Skin is not pale.  Neurological:     General: No focal deficit present.  Psychiatric:        Mood and Affect: Mood normal.        Behavior: Behavior normal.     Lab Results  Component Value Date   WBC 8.3 10/13/2015   HGB 13.6 10/13/2015   HCT 40.4 10/13/2015   PLT 306.0 10/13/2015   GLUCOSE 99 11/15/2017   CHOL 188 11/15/2017   TRIG 96.0 11/15/2017   HDL 81.60 11/15/2017   LDLCALC 87 11/15/2017   ALT 19 10/13/2015   AST 20 10/13/2015   NA 140 11/15/2017   K 4.1 11/15/2017   CL 100 11/15/2017   CREATININE 0.85 11/15/2017   BUN 11 11/15/2017   CO2 24 11/15/2017   TSH 1.27 10/13/2015    PSA GYN 03/20/2018   HGBA1C 5.4 11/15/2017    Mr Cervical Spine Wo Contrast  Result Date: 07/11/2018 CLINICAL DATA:  Cervicalgia EXAM: MRI CERVICAL SPINE WITHOUT CONTRAST TECHNIQUE: Multiplanar, multisequence MR imaging of the cervical spine was performed. No intravenous contrast was administered. COMPARISON:  None. FINDINGS: Alignment: Normal Vertebrae: Normal bone marrow.  Negative for fracture or mass. Cord: Normal signal and morphology. Posterior Fossa, vertebral arteries, paraspinal tissues: Negative. Disc levels: C2-3: Negative C3-4: Negative C4-5: Negative C5-6: Small to moderate right paracentral disc protrusion. 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 Neural foramina patent bilaterally. C6-7: Negative C7-T1: Negative IMPRESSION: Small to moderate right paracentral disc protrusion at C5-6 with mild cord flattening. Otherwise negative Electronically Signed   By: Franchot Gallo M.D.   On: 07/11/2018 09:49    Assessment & Plan:   Kayti was seen today for annual exam.  Diagnoses and all orders for this visit:  Routine general medical examination at a health care facility- Exam reviewed, no labs are indicated, her Pap is up-to-date, vaccines reviewed, patient education material was given.   I have discontinued [name]'s meloxicam. I am also having her maintain her valACYclovir, gabapentin, and levonorgestrel.  No orders of the defined types were placed in this encounter.    Follow-up: Return if symptoms worsen or fail to improve.  [name], MD

## 2018-12-02 NOTE — Progress Notes (Signed)
[name] [address] [place], [place] [address] Phone: [phone] Subjective:   I Priscilla [name] am serving as a Education administrator for Dr. Hulan Saas.  I'm seeing this patient by the request  of:    CC: Low back pain  [id number]  Priscilla [name] is a 33 y.o. female coming in with complaint of neck and lower back pain. States that she is doing well.  Has responded well to manipulation previously.  Patient states some tightness recently.  Has been doing more yard work recently.  No radiation down the legs or any numbness or tingling.    Past Medical History:  Diagnosis Date  . Abnormal Pap smear   . Hx of varicella    Past Surgical History:  Procedure Laterality Date  . CESAREAN SECTION N/A 12/22/2012   Procedure: PRIMARY CESAREAN SECTION;  Surgeon: Darlyn Chamber, MD;  Location: [facility];  Service: Obstetrics;  Laterality: N/A;  . COLPOSCOPY     Social History   Socioeconomic History  . Marital status: Married    Spouse name: Not on file  . Number of children: Not on file  . Years of education: Not on file  . Highest education level: Not on file  Occupational History  . Not on file  Social Needs  . Financial resource strain: Not on file  . Food insecurity:    Worry: Not on file    Inability: Not on file  . Transportation needs:    Medical: Not on file    Non-medical: Not on file  Tobacco Use  . Smoking status: Former Smoker    Last attempt to quit: 06/01/2008    Years since quitting: 10.5  . Smokeless tobacco: Never Used  Substance and Sexual Activity  . Alcohol use: Yes    Alcohol/week: 1.0 standard drinks    Types: 1 Glasses of wine per week  . Drug use: No  . Sexual activity: Yes  Lifestyle  . Physical activity:    Days per week: Not on file    Minutes per session: Not on file  . Stress: Not on file  Relationships  . Social connections:    Talks on phone: Not on file    Gets together: Not on file    Attends religious service:  Not on file    Active member of club or organization: Not on file    Attends meetings of clubs or organizations: Not on file    Relationship status: Not on file  Other Topics Concern  . Not on file  Social History Narrative   Married, one 60 mo old son.   Occupation: Museum/gallery exhibitions officer.   No tobacco.  No alc/drugs.   Caffienated drinks-yes   Seat belt use often-yes   Regular Exercise-yes   Smoke alarm in the home-yes   Firearms/guns in the home-yes   History of physical abuse-no                  No Known Allergies Family History  Problem Relation Age of Onset  . Diabetes Mother   . Hyperlipidemia Father   . Hypertension Father   . Diabetes Father   . Cancer Maternal Grandmother        leaukemia  . Stroke Neg Hx   . Early death Neg Hx   . Kidney disease Neg Hx     Current Outpatient Medications (Endocrine & Metabolic):  .  levonorgestrel (MIRENA) 20 MCG/24HR IUD, 1 each by Intrauterine  route once.      Current Outpatient Medications (Other):  .  gabapentin (NEURONTIN) 300 MG capsule, nightly .  valACYclovir (VALTREX) 1000 MG tablet, Take 2 tablets (2,000 mg total) by mouth 2 (two) times daily. Take over one day.    Past medical history, social, surgical and family history all reviewed in electronic medical record.  No pertanent information unless stated regarding to the chief complaint.   Review of Systems:  No headache, visual changes, nausea, vomiting, diarrhea, constipation, dizziness, abdominal pain, skin rash, fevers, chills, night sweats, weight loss, swollen lymph nodes, body aches, joint swelling, muscle aches, chest pain, shortness of breath, mood changes.   Objective  Blood pressure 110/70, pulse 83, height 5\' 2"  (1.575 m), weight 143 lb (64.9 kg), last menstrual period 11/22/2018, SpO2 99 %.   General: No apparent distress alert and oriented x3 mood and affect normal, dressed appropriately.  HEENT: Pupils equal, extraocular movements intact   Respiratory: Patient's speak in full sentences and does not appear short of breath  Cardiovascular: No lower extremity edema, non tender, no erythema  Skin: Warm dry intact with no signs of infection or rash on extremities or on axial skeleton.  Abdomen: Soft nontender  Neuro: Cranial nerves II through XII are intact, neurovascularly intact in all extremities with 2+ DTRs and 2+ pulses.  Lymph: No lymphadenopathy of posterior or anterior cervical chain or axillae bilaterally.  Gait normal with good balance and coordination.  MSK:  Non tender with full range of motion and good stability and symmetric strength and tone of shoulders, elbows, wrist, hip, knee and ankles bilaterally.  Low back exam shows loss of lordosis.  Patient does have some tenderness to palpation mostly around the sacroiliac joint bilaterally right greater than left.  Mild positive tightness with Corky Sox test.   Osteopathic findings  C6 flexed rotated and side bent left T3 extended rotated and side bent right inhaled third rib T9 extended rotated and side bent left L2 flexed rotated and side bent right Sacrum right on right   Impression and Recommendations:     This case required medical decision making of moderate complexity. The above documentation has been reviewed and is accurate and complete Lyndal Pulley, DO       Note: This dictation was prepared with Dragon dictation along with smaller phrase technology. Any transcriptional errors that result from this process are unintentional.

## 2018-12-03 ENCOUNTER — Encounter: Payer: Self-pay | Admitting: Family Medicine

## 2018-12-03 ENCOUNTER — Ambulatory Visit (INDEPENDENT_AMBULATORY_CARE_PROVIDER_SITE_OTHER): Payer: 59 | Admitting: Family Medicine

## 2018-12-03 ENCOUNTER — Other Ambulatory Visit: Payer: Self-pay

## 2018-12-03 VITALS — BP 110/70 | HR 83 | Ht 62.0 in | Wt 143.0 lb

## 2018-12-03 DIAGNOSIS — M999 Biomechanical lesion, unspecified: Secondary | ICD-10-CM | POA: Diagnosis not present

## 2018-12-03 DIAGNOSIS — M24559 Contracture, unspecified hip: Secondary | ICD-10-CM

## 2018-12-03 NOTE — Assessment & Plan Note (Signed)
Patient recurrent tightness noted again today.  Tolerated the procedure of osteopathic manipulation.  Discussed icing regimen and home exercise.  Patient will increase activity slowly.  Follow-up again in 4 to 6 weeks

## 2018-12-03 NOTE — Assessment & Plan Note (Signed)
Decision today to treat with OMT was based on Physical Exam  After verbal consent patient was treated with HVLA, ME, FPR techniques in cervical, thoracic, lumbar and sacral areas  Patient tolerated the procedure well with improvement in symptoms  Patient given exercises, stretches and lifestyle modifications  See medications in patient instructions if given  Patient will follow up in 4-6 weeks 

## 2019-01-14 ENCOUNTER — Ambulatory Visit (INDEPENDENT_AMBULATORY_CARE_PROVIDER_SITE_OTHER): Payer: 59 | Admitting: Family Medicine

## 2019-01-14 ENCOUNTER — Encounter: Payer: Self-pay | Admitting: Family Medicine

## 2019-01-14 ENCOUNTER — Other Ambulatory Visit: Payer: Self-pay

## 2019-01-14 VITALS — BP 104/70 | HR 80 | Ht 62.0 in | Wt 140.0 lb

## 2019-01-14 DIAGNOSIS — M999 Biomechanical lesion, unspecified: Secondary | ICD-10-CM

## 2019-01-14 DIAGNOSIS — M5412 Radiculopathy, cervical region: Secondary | ICD-10-CM

## 2019-01-14 NOTE — Assessment & Plan Note (Signed)
Cervical radiculopathy is improved at this time.  Responded well to osteopathic manipulation.  Discussed posture and ergonomics.  Discussed which activities to do which wants to avoid.  Follow-up again in 4 to 8 weeks

## 2019-01-14 NOTE — Assessment & Plan Note (Signed)
Decision today to treat with OMT was based on Physical Exam  After verbal consent patient was treated with HVLA, ME, FPR techniques in cervical, thoracic, rib,  lumbar and sacral areas  Patient tolerated the procedure well with improvement in symptoms  Patient given exercises, stretches and lifestyle modifications  See medications in patient instructions if given  Patient will follow up in 4-8 weeks 

## 2019-01-14 NOTE — Progress Notes (Signed)
[name] [place], [place] [address] Phone: [phone] Subjective:   I Priscilla [name] am serving as a Education administrator for Dr. Hulan Saas.   CC: Back pain follow-up and neck pain follow-up  UMP:[id number]   12/03/2018 Patient recurrent tightness noted again today.  Tolerated the procedure of osteopathic manipulation.  Discussed icing regimen and home exercise.  Patient will increase activity slowly.  Follow-up again in 4 to 6 weeks  01/14/2019 Priscilla [name] is a 33 y.o. female coming in with complaint of hip flexor pain. States that about a week after her last visit she went tubing. Has been tight since then.  Doing significantly better.  More of a tightness than anything else.  Patient states that went on the to be and had significant amount of pain only noted.  Seems to be improving slowly but he does have tightness    Past Medical History:  Diagnosis Date  . Abnormal Pap smear   . Hx of varicella    Past Surgical History:  Procedure Laterality Date  . CESAREAN SECTION N/A 12/22/2012   Procedure: PRIMARY CESAREAN SECTION;  Surgeon: Darlyn Chamber, MD;  Location: [facility];  Service: Obstetrics;  Laterality: N/A;  . COLPOSCOPY     Social History   Socioeconomic History  . Marital status: Married    Spouse name: Not on file  . Number of children: Not on file  . Years of education: Not on file  . Highest education level: Not on file  Occupational History  . Not on file  Social Needs  . Financial resource strain: Not on file  . Food insecurity    Worry: Not on file    Inability: Not on file  . Transportation needs    Medical: Not on file    Non-medical: Not on file  Tobacco Use  . Smoking status: Former Smoker    Quit date: 06/01/2008    Years since quitting: 10.6  . Smokeless tobacco: Never Used  Substance and Sexual Activity  . Alcohol use: Yes    Alcohol/week: 1.0 standard drinks    Types: 1 Glasses of wine per week  . Drug use:  No  . Sexual activity: Yes  Lifestyle  . Physical activity    Days per week: Not on file    Minutes per session: Not on file  . Stress: Not on file  Relationships  . Social Herbalist on phone: Not on file    Gets together: Not on file    Attends religious service: Not on file    Active member of club or organization: Not on file    Attends meetings of clubs or organizations: Not on file    Relationship status: Not on file  Other Topics Concern  . Not on file  Social History Narrative   Married, one 63 mo old son.   Occupation: Museum/gallery exhibitions officer.   No tobacco.  No alc/drugs.   Caffienated drinks-yes   Seat belt use often-yes   Regular Exercise-yes   Smoke alarm in the home-yes   Firearms/guns in the home-yes   History of physical abuse-no                  No Known Allergies Family History  Problem Relation Age of Onset  . Diabetes Mother   . Hyperlipidemia Father   . Hypertension Father   . Diabetes Father   . Cancer Maternal Grandmother  leaukemia  . Stroke Neg Hx   . Early death Neg Hx   . Kidney disease Neg Hx     Current Outpatient Medications (Endocrine & Metabolic):  .  levonorgestrel (MIRENA) 20 MCG/24HR IUD, 1 each by Intrauterine route once.      Current Outpatient Medications (Other):  .  gabapentin (NEURONTIN) 300 MG capsule, nightly .  valACYclovir (VALTREX) 1000 MG tablet, Take 2 tablets (2,000 mg total) by mouth 2 (two) times daily. Take over one day.    Past medical history, social, surgical and family history all reviewed in electronic medical record.  No pertanent information unless stated regarding to the chief complaint.   Review of Systems:  No headache, visual changes, nausea, vomiting, diarrhea, constipation, dizziness, abdominal pain, skin rash, fevers, chills, night sweats, weight loss, swollen lymph nodes, body aches, joint swelling,  chest pain, shortness of breath, mood changes.  Positive muscle aches   Objective  Blood pressure 104/70, pulse 80, height 5\' 2"  (1.575 m), weight 140 lb (63.5 kg), SpO2 99 %.    General: No apparent distress alert and oriented x3 mood and affect normal, dressed appropriately.  HEENT: Pupils equal, extraocular movements intact  Respiratory: Patient's speak in full sentences and does not appear short of breath  Cardiovascular: No lower extremity edema, non tender, no erythema  Skin: Warm dry intact with no signs of infection or rash on extremities or on axial skeleton.  Abdomen: Soft nontender  Neuro: Cranial nerves II through XII are intact, neurovascularly intact in all extremities with 2+ DTRs and 2+ pulses.  Lymph: No lymphadenopathy of posterior or anterior cervical chain or axillae bilaterally.  Gait normal with good balance and coordination.  MSK:  Non tender with full range of motion and good stability and symmetric strength and tone of shoulders, elbows, wrist, hip, knee and ankles bilaterally.  Back Exam:  Inspection: Loss of lordosis Motion: Flexion 35 deg, Extension 25 deg, Side Bending to 35 deg bilaterally,  Rotation to 35 deg bilaterally  SLR laying: Negative  XSLR laying: Negative  Palpable tenderness: Tender to palpation in the paraspinal musculature around the sacroiliac joints bilaterally. FABER: Tightness on the right. Sensory change: Gross sensation intact to all lumbar and sacral dermatomes.  Reflexes: 2+ at both patellar tendons, 2+ at achilles tendons, Babinski's downgoing.  Strength at foot  Plantar-flexion: 5/5 Dorsi-flexion: 5/5 Eversion: 5/5 Inversion: 5/5  Leg strength  Quad: 5/5 Hamstring: 5/5 Hip flexor: 5/5 Hip abductors: 5/5  Gait unremarkable. Neck: Inspection mild loss of lordosis. No palpable stepoffs. Negative Spurling's maneuver. Full neck range of motion Grip strength and sensation normal in bilateral hands Strength good C4 to T1 distribution No sensory change to C4 to T1 Negative Hoffman sign bilaterally  Reflexes normal  Osteopathic findings C2 flexed rotated and side bent right C6 flexed rotated and side bent left T3 extended rotated and side bent right inhaled third rib T9 extended rotated and side bent left L2 flexed rotated and side bent right Sacrum right on right    Impression and Recommendations:     This case required medical decision making of moderate complexity. The above documentation has been reviewed and is accurate and complete Priscilla Pulley, DO       Note: This dictation was prepared with Dragon dictation along with smaller phrase technology. Any transcriptional errors that result from this process are unintentional.

## 2019-01-22 DIAGNOSIS — Z719 Counseling, unspecified: Secondary | ICD-10-CM

## 2019-01-27 ENCOUNTER — Encounter: Payer: Self-pay | Admitting: Family Medicine

## 2019-01-28 MED ORDER — MELOXICAM 15 MG PO TABS: 15.0000 mg | ORAL_TABLET | Freq: Every day | ORAL | 0 refills | Status: DC

## 2019-01-28 MED FILL — MELOXICAM 15 MG TABLET: 15 | 30 days supply | Qty: 30 | Fill #0

## 2019-01-28 NOTE — Telephone Encounter (Signed)
Done.  If not better by Wednesday please write me again and we may work you in

## 2019-01-29 DIAGNOSIS — Z719 Counseling, unspecified: Secondary | ICD-10-CM

## 2019-02-15 DIAGNOSIS — Z719 Counseling, unspecified: Secondary | ICD-10-CM

## 2019-02-15 MED FILL — GABAPENTIN 300 MG CAPSULE: 300 | 30 days supply | Qty: 30 | Fill #0

## 2019-02-18 ENCOUNTER — Ambulatory Visit (INDEPENDENT_AMBULATORY_CARE_PROVIDER_SITE_OTHER): Payer: 59 | Admitting: Family Medicine

## 2019-02-18 ENCOUNTER — Encounter: Payer: Self-pay | Admitting: Family Medicine

## 2019-02-18 VITALS — BP 100/70 | HR 93 | Ht 62.0 in | Wt 141.0 lb

## 2019-02-18 DIAGNOSIS — M5412 Radiculopathy, cervical region: Secondary | ICD-10-CM | POA: Diagnosis not present

## 2019-02-18 DIAGNOSIS — M999 Biomechanical lesion, unspecified: Secondary | ICD-10-CM | POA: Diagnosis not present

## 2019-02-18 MED FILL — valACYclovir HCL 1 GM TABS: 1 | 1 days supply | Qty: 4 | Fill #0

## 2019-02-18 NOTE — Progress Notes (Signed)
[name] [place], [place] [address] Phone: [phone] Subjective:   I Priscilla [name] am serving as a Education administrator for Dr. Hulan Saas.   CC: Neck and back pain follow-up  UKG:[id number]  Priscilla [name] is a 33 y.o. female coming in with complaint of back pain. States she is doing well.  Patient states that she is doing better overall.  Still some mild tightness.  Patient is trying to work out on a more regular basis.  Finds it somewhat difficult.  Still also notes association with neck pain and stress.  That is stopping her from activity at this point.       Past Medical History:  Diagnosis Date  . Abnormal Pap smear   . Hx of varicella    Past Surgical History:  Procedure Laterality Date  . CESAREAN SECTION N/A 12/22/2012   Procedure: PRIMARY CESAREAN SECTION;  Surgeon: Darlyn Chamber, MD;  Location: [facility];  Service: Obstetrics;  Laterality: N/A;  . COLPOSCOPY     Social History   Socioeconomic History  . Marital status: Married    Spouse name: Not on file  . Number of children: Not on file  . Years of education: Not on file  . Highest education level: Not on file  Occupational History  . Not on file  Social Needs  . Financial resource strain: Not on file  . Food insecurity    Worry: Not on file    Inability: Not on file  . Transportation needs    Medical: Not on file    Non-medical: Not on file  Tobacco Use  . Smoking status: Former Smoker    Quit date: 06/01/2008    Years since quitting: 10.7  . Smokeless tobacco: Never Used  Substance and Sexual Activity  . Alcohol use: Yes    Alcohol/week: 1.0 standard drinks    Types: 1 Glasses of wine per week  . Drug use: No  . Sexual activity: Yes  Lifestyle  . Physical activity    Days per week: Not on file    Minutes per session: Not on file  . Stress: Not on file  Relationships  . Social Herbalist on phone: Not on file    Gets together: Not on file   Attends religious service: Not on file    Active member of club or organization: Not on file    Attends meetings of clubs or organizations: Not on file    Relationship status: Not on file  Other Topics Concern  . Not on file  Social History Narrative   Married, one 8 mo old son.   Occupation: Museum/gallery exhibitions officer.   No tobacco.  No alc/drugs.   Caffienated drinks-yes   Seat belt use often-yes   Regular Exercise-yes   Smoke alarm in the home-yes   Firearms/guns in the home-yes   History of physical abuse-no                  No Known Allergies Family History  Problem Relation Age of Onset  . Diabetes Mother   . Hyperlipidemia Father   . Hypertension Father   . Diabetes Father   . Cancer Maternal Grandmother        leaukemia  . Stroke Neg Hx   . Early death Neg Hx   . Kidney disease Neg Hx     Current Outpatient Medications (Endocrine & Metabolic):  .  levonorgestrel (MIRENA) 20 MCG/24HR  IUD, 1 each by Intrauterine route once.    Current Outpatient Medications (Analgesics):  .  meloxicam (MOBIC) 15 MG tablet, Take 1 tablet (15 mg total) by mouth daily.   Current Outpatient Medications (Other):  .  gabapentin (NEURONTIN) 300 MG capsule, nightly .  valACYclovir (VALTREX) 1000 MG tablet, Take 2 tablets (2,000 mg total) by mouth 2 (two) times daily. Take over one day.    Past medical history, social, surgical and family history all reviewed in electronic medical record.  No pertanent information unless stated regarding to the chief complaint.   Review of Systems:  No headache, visual changes, nausea, vomiting, diarrhea, constipation, dizziness, abdominal pain, skin rash, fevers, chills, night sweats, weight loss, swollen lymph nodes, body aches, joint swelling, muscle aches, chest pain, shortness of breath, mood changes.   Objective  There were no vitals taken for this visit. Systems examined below as of    General: No apparent distress alert and oriented x3 mood  and affect normal, dressed appropriately.  HEENT: Pupils equal, extraocular movements intact  Respiratory: Patient's speak in full sentences and does not appear short of breath  Cardiovascular: No lower extremity edema, non tender, no erythema  Skin: Warm dry intact with no signs of infection or rash on extremities or on axial skeleton.  Abdomen: Soft nontender  Neuro: Cranial nerves II through XII are intact, neurovascularly intact in all extremities with 2+ DTRs and 2+ pulses.  Lymph: No lymphadenopathy of posterior or anterior cervical chain or axillae bilaterally.  Gait normal with good balance and coordination.  MSK:  Non tender with full range of motion and good stability and symmetric strength and tone of shoulders, elbows, wrist, hip, knee and ankles bilaterally.  Neck: Inspection mild loss of lordosis. No palpable stepoffs. Negative Spurling's maneuver. Mild limitation with left-sided rotation Grip strength and sensation normal in bilateral hands Strength good C4 to T1 distribution No sensory change to C4 to T1 Negative Hoffman sign bilaterally Reflexes normal Tightness in the trapezius bilaterally  Osteopathic findings  C4 flexed rotated and side bent left C7 flexed rotated and side bent left T3 extended rotated and side bent right inhaled third rib T5 extended rotated and side bent left L2 flexed rotated and side bent right Sacrum right on right   Impression and Recommendations:     This case required medical decision making of moderate complexity. The above documentation has been reviewed and is accurate and complete Lyndal Pulley, DO       Note: This dictation was prepared with Dragon dictation along with smaller phrase technology. Any transcriptional errors that result from this process are unintentional.

## 2019-02-18 NOTE — Assessment & Plan Note (Signed)
Decision today to treat with OMT was based on Physical Exam  After verbal consent patient was treated with HVLA, ME, FPR techniques in cervical, thoracic, lumbar and sacral areas  Patient tolerated the procedure well with improvement in symptoms  Patient given exercises, stretches and lifestyle modifications  See medications in patient instructions if given  Patient will follow up in 6-8 weeks 

## 2019-02-18 NOTE — Assessment & Plan Note (Signed)
Minimal discomfort at this time.  Discussed posture and ergonomics.  Responds very well to osteopathic manipulation.  We discussed the ergonomics throughout the day.  Follow-up with me again in 6 to 8 weeks

## 2019-03-26 ENCOUNTER — Other Ambulatory Visit: Payer: Self-pay | Admitting: Family Medicine

## 2019-03-26 MED FILL — GABAPENTIN 300 MG CAPSULE: 300 | 30 days supply | Qty: 30 | Fill #0

## 2019-04-01 ENCOUNTER — Ambulatory Visit: Payer: 59 | Admitting: Family Medicine

## 2019-04-02 NOTE — Progress Notes (Signed)
[name] [address], [place] [address] Phone: [phone] Subjective:   [name], am serving as a scribe for Dr. Hulan Saas.   CC: Back pain follow-up  [id number]  Priscilla [name] is a 33 y.o. female coming in with complaint of back pain. Last seen on 02/18/2019 for OMT. Patient states that she woke up with neck tightness. Has pain on right more left. Denies any radiating symptoms.     Past Medical History:  Diagnosis Date  . Abnormal Pap smear   . Hx of varicella    Past Surgical History:  Procedure Laterality Date  . CESAREAN SECTION N/A 12/22/2012   Procedure: PRIMARY CESAREAN SECTION;  Surgeon: Darlyn Chamber, MD;  Location: [facility];  Service: Obstetrics;  Laterality: N/A;  . COLPOSCOPY     Social History   Socioeconomic History  . Marital status: Married    Spouse name: Not on file  . Number of children: Not on file  . Years of education: Not on file  . Highest education level: Not on file  Occupational History  . Not on file  Social Needs  . Financial resource strain: Not on file  . Food insecurity    Worry: Not on file    Inability: Not on file  . Transportation needs    Medical: Not on file    Non-medical: Not on file  Tobacco Use  . Smoking status: Former Smoker    Quit date: 06/01/2008    Years since quitting: 10.8  . Smokeless tobacco: Never Used  Substance and Sexual Activity  . Alcohol use: Yes    Alcohol/week: 1.0 standard drinks    Types: 1 Glasses of wine per week  . Drug use: No  . Sexual activity: Yes  Lifestyle  . Physical activity    Days per week: Not on file    Minutes per session: Not on file  . Stress: Not on file  Relationships  . Social Herbalist on phone: Not on file    Gets together: Not on file    Attends religious service: Not on file    Active member of club or organization: Not on file    Attends meetings of clubs or organizations: Not on file    Relationship  status: Not on file  Other Topics Concern  . Not on file  Social History Narrative   Married, one 77 mo old son.   Occupation: Museum/gallery exhibitions officer.   No tobacco.  No alc/drugs.   Caffienated drinks-yes   Seat belt use often-yes   Regular Exercise-yes   Smoke alarm in the home-yes   Firearms/guns in the home-yes   History of physical abuse-no                  No Known Allergies Family History  Problem Relation Age of Onset  . Diabetes Mother   . Hyperlipidemia Father   . Hypertension Father   . Diabetes Father   . Cancer Maternal Grandmother        leaukemia  . Stroke Neg Hx   . Early death Neg Hx   . Kidney disease Neg Hx     Current Outpatient Medications (Endocrine & Metabolic):  .  levonorgestrel (MIRENA) 20 MCG/24HR IUD, 1 each by Intrauterine route once.    Current Outpatient Medications (Analgesics):  .  meloxicam (MOBIC) 15 MG tablet, Take 1 tablet (15 mg total) by mouth daily.  Current Outpatient Medications (Other):  .  gabapentin (NEURONTIN) 300 MG capsule, TAKE 1 CAPSULE BY MOUTH NIGHTLY .  valACYclovir (VALTREX) 1000 MG tablet, Take 2 tablets (2,000 mg total) by mouth 2 (two) times daily. Take over one day.    Past medical history, social, surgical and family history all reviewed in electronic medical record.  No pertanent information unless stated regarding to the chief complaint.   Review of Systems:  No headache, visual changes, nausea, vomiting, diarrhea, constipation, dizziness, abdominal pain, skin rash, fevers, chills, night sweats, weight loss, swollen lymph nodes, body aches, joint swelling, chest pain, shortness of breath, mood changes.  Positive muscle aches  Objective  Blood pressure 114/72, pulse 69, height 5\' 2"  (1.575 m), weight 141 lb (64 kg), SpO2 99 %.    General: No apparent distress alert and oriented x3 mood and affect normal, dressed appropriately.  HEENT: Pupils equal, extraocular movements intact  Respiratory: Patient's  speak in full sentences and does not appear short of breath  Cardiovascular: No lower extremity edema, non tender, no erythema  Skin: Warm dry intact with no signs of infection or rash on extremities or on axial skeleton.  Abdomen: Soft nontender  Neuro: Cranial nerves II through XII are intact, neurovascularly intact in all extremities with 2+ DTRs and 2+ pulses.  Lymph: No lymphadenopathy of posterior or anterior cervical chain or axillae bilaterally.  Gait normal with good balance and coordination.  MSK:  Non tender with full range of motion and good stability and symmetric strength and tone of shoulders, elbows, wrist, hip, knee and ankles bilaterally.  Neck exam does have some loss of lordosis.  Tightness in the right side of the neck and right trapezius.  No radicular symptoms Spurling's.  Osteopathic findings C2 flexed rotated and side bent right C6 flexed rotated and side bent left T5 extended rotated and side bent right inhaled rib T9 extended rotated and side bent left L2 flexed rotated and side bent right      Impression and Recommendations:     This case required medical decision making of moderate complexity. The above documentation has been reviewed and is accurate and complete Lyndal Pulley, DO       Note: This dictation was prepared with Dragon dictation along with smaller phrase technology. Any transcriptional errors that result from this process are unintentional.

## 2019-04-03 ENCOUNTER — Other Ambulatory Visit: Payer: Self-pay

## 2019-04-03 ENCOUNTER — Ambulatory Visit (INDEPENDENT_AMBULATORY_CARE_PROVIDER_SITE_OTHER): Payer: 59 | Admitting: Family Medicine

## 2019-04-03 ENCOUNTER — Encounter: Payer: Self-pay | Admitting: Family Medicine

## 2019-04-03 VITALS — BP 114/72 | HR 69 | Ht 62.0 in | Wt 141.0 lb

## 2019-04-03 DIAGNOSIS — M999 Biomechanical lesion, unspecified: Secondary | ICD-10-CM

## 2019-04-03 DIAGNOSIS — M5412 Radiculopathy, cervical region: Secondary | ICD-10-CM

## 2019-04-03 NOTE — Assessment & Plan Note (Signed)
Decision today to treat with OMT was based on Physical Exam  After verbal consent patient was treated with HVLA, ME, FPR techniques in cervical, thoracic, lumbar and sacral areas  Patient tolerated the procedure well with improvement in symptoms  Patient given exercises, stretches and lifestyle modifications  See medications in patient instructions if given  Patient will follow up in 4-8 weeks 

## 2019-04-03 NOTE — Assessment & Plan Note (Signed)
Significant movement at this time.  Patient still has some tightness.  No radicular symptoms.  Continue the gabapentin.  Follow-up again in 4 to 8 weeks

## 2019-04-19 IMAGING — MR MR CERVICAL SPINE W/O CM
4 of 5 series · 27 of 48 positions shown · non-contrast
Comparison: None.

CLINICAL DATA: Cervicalgia

EXAM:
MRI CERVICAL SPINE WITHOUT CONTRAST
TECHNIQUE: Multiplanar, multisequence MR imaging of the cervical spine was
performed. No intravenous contrast was administered.

[Series 5: T1 · sagittal · 3.0mm · 0.66mm/px · 6 of 13 slices shown]
[im 1/13]
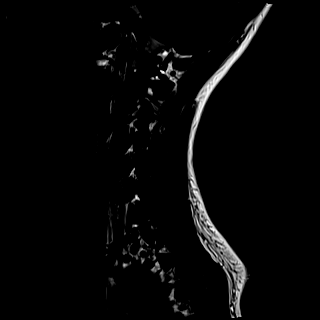
[im 3/13]
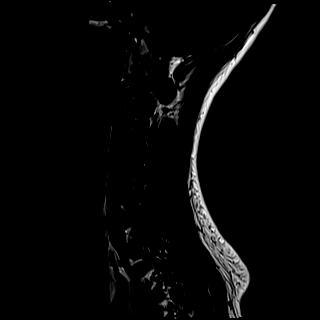
[im 5/13]
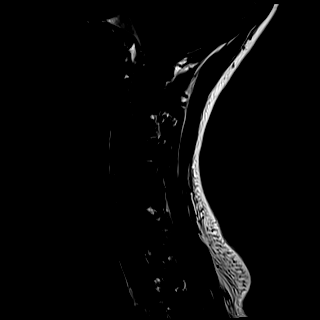
[im 8/13]
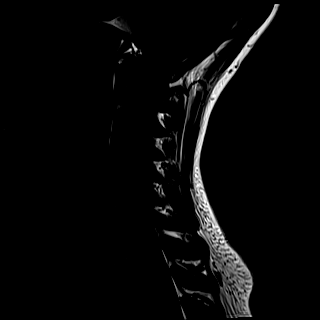
[im 10/13]
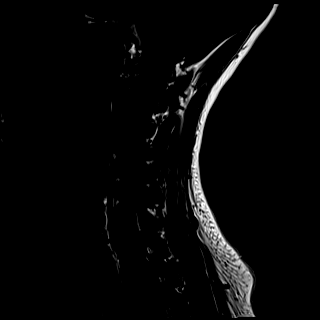
[im 13/13]
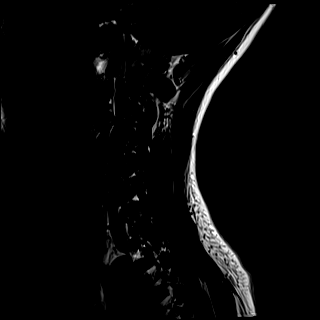

[Series 6: T2 · sagittal · 3.0mm · 0.55mm/px · 7 of 13 slices shown (1 of 2)]
[im 1/13]
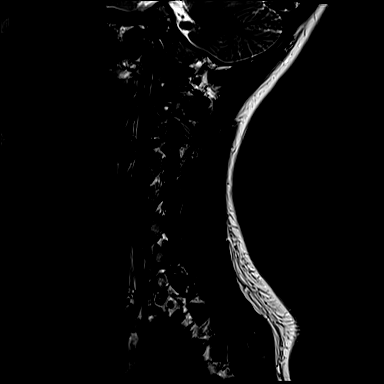
[im 3/13]
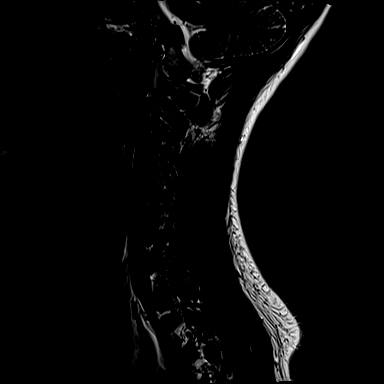
[im 5/13]
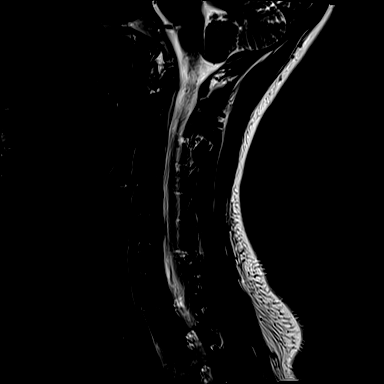
[im 7/13]
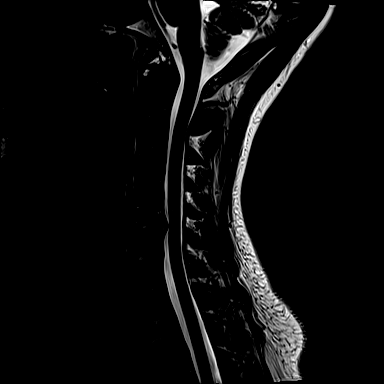
[im 9/13]
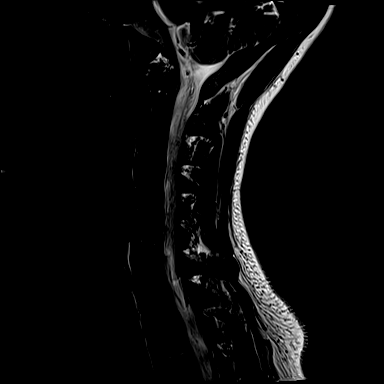
[im 11/13]
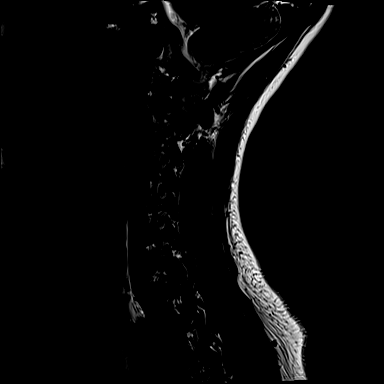
[im 13/13]
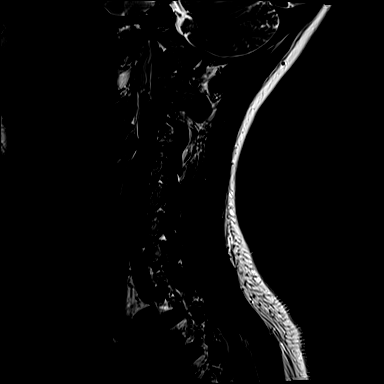

[Series 7: STIR · sagittal · 3.0mm · 0.33mm/px · 6 of 13 slices shown]
[im 1/13]
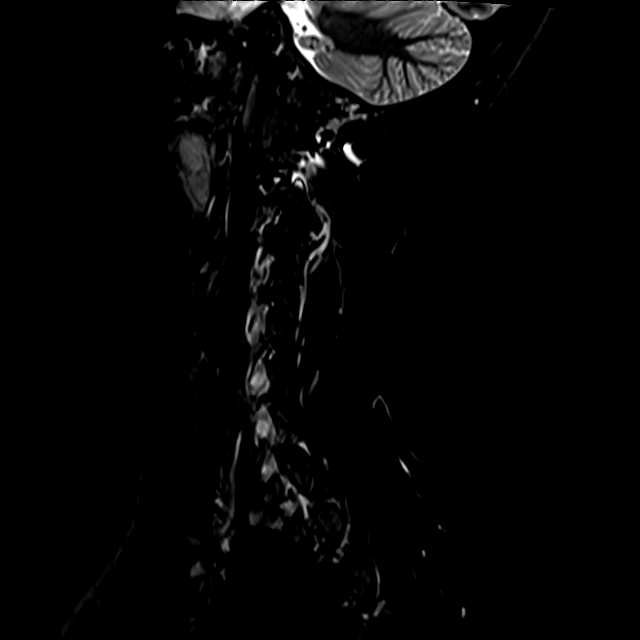
[im 3/13]
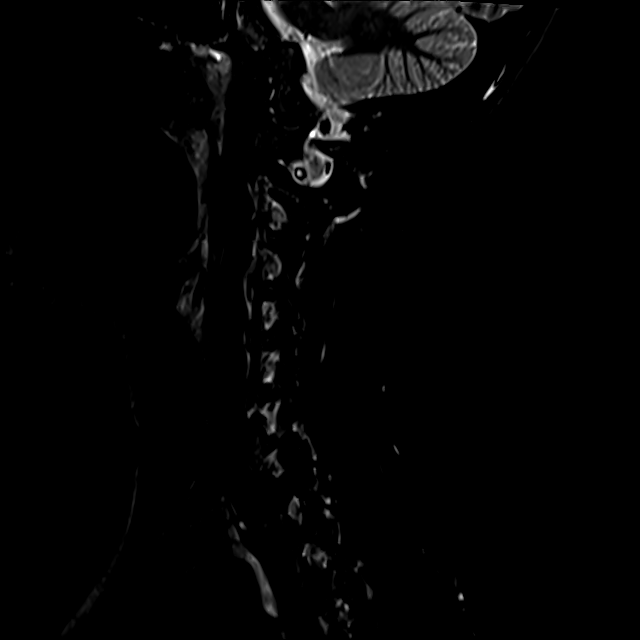
[im 5/13]
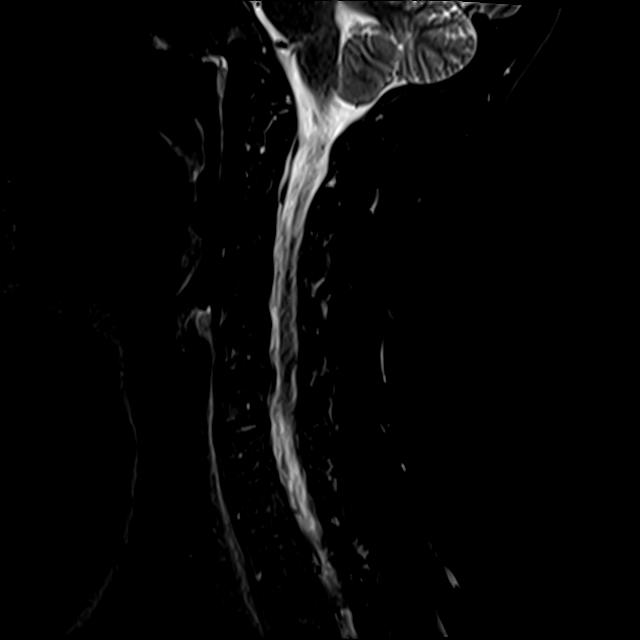
[im 7/13]
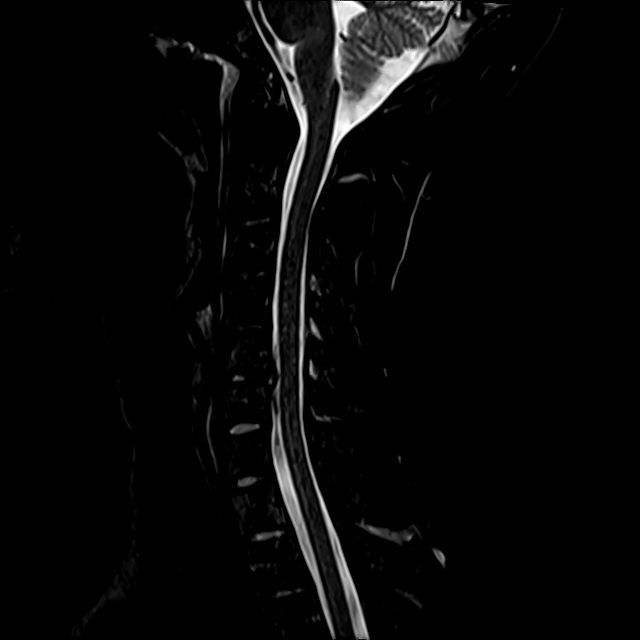
[im 9/13]
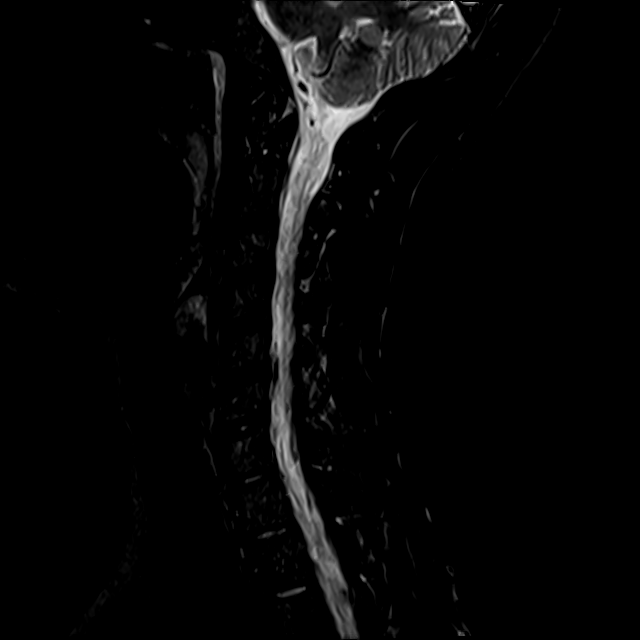
[im 11/13]
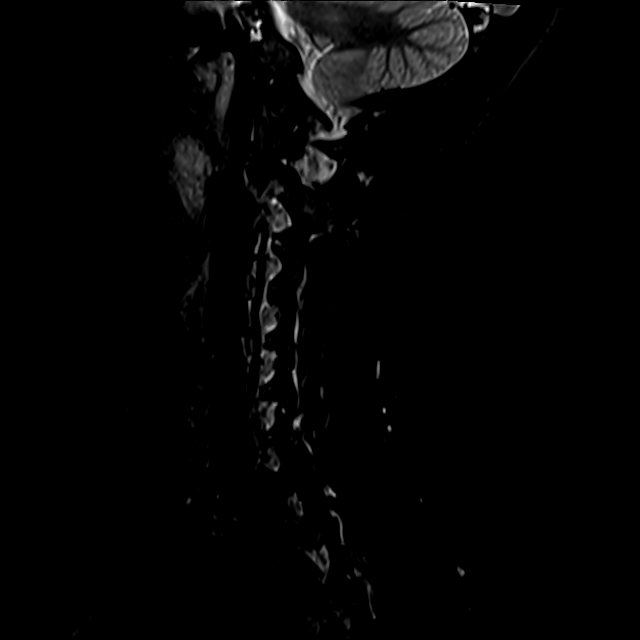

[Series 8: T2 · axial · 3.0mm · 0.50mm/px · z∈[-42,+62]mm · 8 of 28 slices shown (2 of 2)]
[im 1/28]
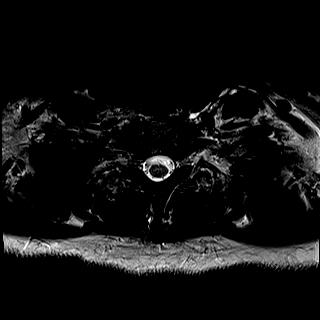
[im 5/28]
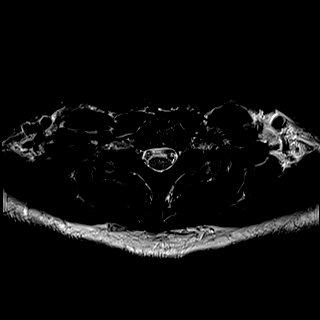
[im 9/28]
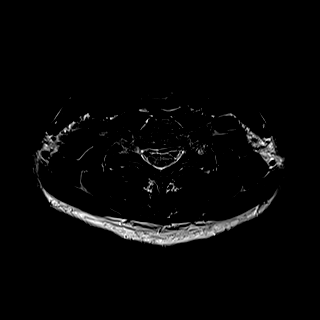
[im 13/28]
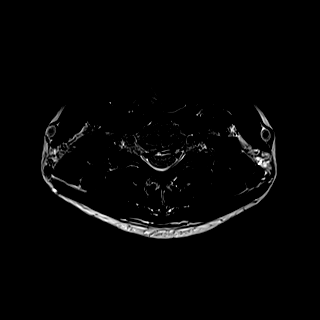
[im 15/28]
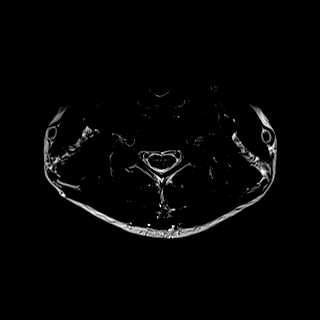
[im 19/28]
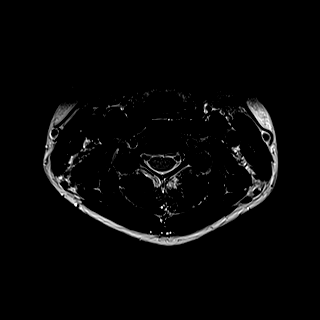
[im 23/28]
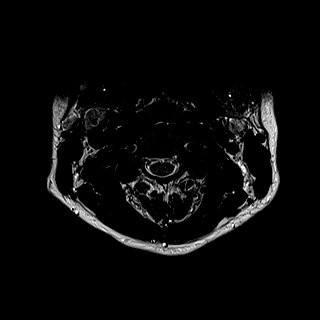
[im 28/28]
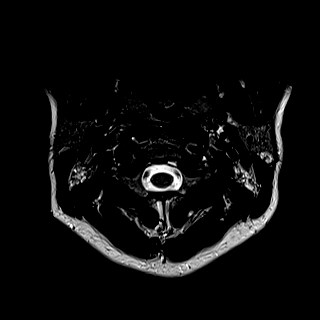

[27 of 48 positions shown; findings below may reference images not displayed]

FINDINGS: Alignment: Normal

Vertebrae: Normal bone marrow.  Negative for fracture or mass.

Cord: Normal signal and morphology.

Posterior Fossa, vertebral arteries, paraspinal tissues: Negative.

Disc levels:

C2-3: Negative

C3-4: Negative

C4-5: Negative

C5-6: Small to moderate right paracentral disc protrusion. Mild cord
flattening on the right with mild spinal stenosis. Neural foramina
patent bilaterally.

C6-7: Negative

C7-T1: Negative
IMPRESSION: Small to moderate right paracentral disc protrusion at C5-6 with
mild cord flattening. Otherwise negative

## 2019-04-29 ENCOUNTER — Other Ambulatory Visit: Payer: Self-pay

## 2019-04-30 ENCOUNTER — Other Ambulatory Visit: Payer: Self-pay | Admitting: Family Medicine

## 2019-04-30 MED FILL — GABAPENTIN 300 MG CAPSULE: 300 | 30 days supply | Qty: 30 | Fill #0

## 2019-05-03 DIAGNOSIS — Z01419 Encounter for gynecological examination (general) (routine) without abnormal findings: Secondary | ICD-10-CM | POA: Diagnosis not present

## 2019-05-03 DIAGNOSIS — Z30431 Encounter for routine checking of intrauterine contraceptive device: Secondary | ICD-10-CM | POA: Diagnosis not present

## 2019-05-03 DIAGNOSIS — Z6825 Body mass index (BMI) 25.0-25.9, adult: Secondary | ICD-10-CM | POA: Diagnosis not present

## 2019-05-08 ENCOUNTER — Other Ambulatory Visit: Payer: Self-pay

## 2019-05-08 ENCOUNTER — Encounter: Payer: Self-pay | Admitting: Family Medicine

## 2019-05-08 ENCOUNTER — Ambulatory Visit (INDEPENDENT_AMBULATORY_CARE_PROVIDER_SITE_OTHER): Payer: 59 | Admitting: Family Medicine

## 2019-05-08 VITALS — BP 112/76 | HR 88 | Ht 62.0 in | Wt 143.4 lb

## 2019-05-08 DIAGNOSIS — M5412 Radiculopathy, cervical region: Secondary | ICD-10-CM | POA: Diagnosis not present

## 2019-05-08 DIAGNOSIS — M999 Biomechanical lesion, unspecified: Secondary | ICD-10-CM | POA: Diagnosis not present

## 2019-05-08 NOTE — Progress Notes (Signed)
[name], [place] [address] Phone: [phone] Subjective:    I'm seeing this patient by the request  of:    CC: Neck and back pain  [id number]    04/03/19: Significant movement at this time.  Patient still has some tightness.  No radicular symptoms.  Continue the gabapentin.  Follow-up again in 4 to 8 weeks  Update- 05/08/19: Priscilla [name] is a 33 y.o. female coming in with complaint of neck and back pain.  She states that she's feeling well today and has no particular complaint.  Pt con't to take the Gabapentin.  She denies any radicular symptoms in her UEs or LEs.      Past Medical History:  Diagnosis Date  . Abnormal Pap smear   . Hx of varicella    Past Surgical History:  Procedure Laterality Date  . CESAREAN SECTION N/A 12/22/2012   Procedure: PRIMARY CESAREAN SECTION;  Surgeon: Darlyn Chamber, MD;  Location: [facility];  Service: Obstetrics;  Laterality: N/A;  . COLPOSCOPY     Social History   Socioeconomic History  . Marital status: Married    Spouse name: Not on file  . Number of children: Not on file  . Years of education: Not on file  . Highest education level: Not on file  Occupational History  . Not on file  Social Needs  . Financial resource strain: Not on file  . Food insecurity    Worry: Not on file    Inability: Not on file  . Transportation needs    Medical: Not on file    Non-medical: Not on file  Tobacco Use  . Smoking status: Former Smoker    Quit date: 06/01/2008    Years since quitting: 10.9  . Smokeless tobacco: Never Used  Substance and Sexual Activity  . Alcohol use: Yes    Alcohol/week: 1.0 standard drinks    Types: 1 Glasses of wine per week  . Drug use: No  . Sexual activity: Yes  Lifestyle  . Physical activity    Days per week: Not on file    Minutes per session: Not on file  . Stress: Not on file  Relationships  . Social Herbalist on phone: Not on file   Gets together: Not on file    Attends religious service: Not on file    Active member of club or organization: Not on file    Attends meetings of clubs or organizations: Not on file    Relationship status: Not on file  Other Topics Concern  . Not on file  Social History Narrative   Married, one 57 mo old son.   Occupation: Museum/gallery exhibitions officer.   No tobacco.  No alc/drugs.   Caffienated drinks-yes   Seat belt use often-yes   Regular Exercise-yes   Smoke alarm in the home-yes   Firearms/guns in the home-yes   History of physical abuse-no                  No Known Allergies Family History  Problem Relation Age of Onset  . Diabetes Mother   . Hyperlipidemia Father   . Hypertension Father   . Diabetes Father   . Cancer Maternal Grandmother        leaukemia  . Stroke Neg Hx   . Early death Neg Hx   . Kidney disease Neg Hx     Current Outpatient Medications (Endocrine & Metabolic):  .  levonorgestrel (MIRENA) 20 MCG/24HR IUD, 1 each by Intrauterine route once.    Current Outpatient Medications (Analgesics):  .  meloxicam (MOBIC) 15 MG tablet, Take 1 tablet (15 mg total) by mouth daily. (Patient not taking: Reported on 05/08/2019)   Current Outpatient Medications (Other):  .  gabapentin (NEURONTIN) 300 MG capsule, TAKE 1 CAPSULE BY MOUTH NIGHTLY .  valACYclovir (VALTREX) 1000 MG tablet, Take 2 tablets (2,000 mg total) by mouth 2 (two) times daily. Take over one day.    Past medical history, social, surgical and family history all reviewed in electronic medical record.  No pertanent information unless stated regarding to the chief complaint.   Review of Systems:  No headache, visual changes, nausea, vomiting, diarrhea, constipation, dizziness, abdominal pain, skin rash, fevers, chills, night sweats, weight loss, swollen lymph nodes, body aches, joint swelling, muscle aches, chest pain, shortness of breath, mood changes.   Objective  Blood pressure 112/76, pulse 88,  height 5\' 2"  (1.575 m), weight 143 lb 6.4 oz (65 kg), SpO2 100 %.    General: No apparent distress alert and oriented x3 mood and affect normal, dressed appropriately.  HEENT: Pupils equal, extraocular movements intact  Respiratory: Patient's speak in full sentences and does not appear short of breath  Cardiovascular: No lower extremity edema, non tender, no erythema  Skin: Warm dry intact with no signs of infection or rash on extremities or on axial skeleton.  Abdomen: Soft nontender  Neuro: Cranial nerves II through XII are intact, neurovascularly intact in all extremities with 2+ DTRs and 2+ pulses.  Lymph: No lymphadenopathy of posterior or anterior cervical chain or axillae bilaterally.  Gait normal with good balance and coordination.  MSK:  Non tender with full range of motion and good stability and symmetric strength and tone of shoulders, elbows, wrist, hip, knee and ankles bilaterally.  Neck exam has some very mild loss of lordosis.  Negative Spurling's.  Patient does have mild tenderness in the paraspinal musculature more left greater than right.  A little different than previous exam.  Negative straight leg test of the lower back.  5 out of 5 strength of all extremities.  Osteopathic findings  C3 flexed rotated and side bent right T3 extended rotated and side bent left inhaled third rib T9 extended rotated and side bent left L2 flexed rotated and side bent right Sacrum right on right    Impression and Recommendations:     This case required medical decision making of moderate complexity. The above documentation has been reviewed and is accurate and complete Lyndal Pulley, DO       Note: This dictation was prepared with Dragon dictation along with smaller phrase technology. Any transcriptional errors that result from this process are unintentional.

## 2019-05-08 NOTE — Assessment & Plan Note (Signed)
Decision today to treat with OMT was based on Physical Exam  After verbal consent patient was treated with HVLA, ME, FPR techniques in cervical, thoracic, lumbar and sacral areas  Patient tolerated the procedure well with improvement in symptoms  Patient given exercises, stretches and lifestyle modifications  See medications in patient instructions if given  Patient will follow up in 4-8 weeks 

## 2019-05-08 NOTE — Assessment & Plan Note (Signed)
Continues to have some neck pain but no radicular symptoms.  Had been taking gabapentin very regularly.  Has meloxicam for for breakthrough pain otherwise.  Continues to work out fairly regularly.  Does not affect daily activities.  Follow-up again in 4 to 8 weeks

## 2019-05-15 LAB — HM PAP SMEAR

## 2019-05-16 DIAGNOSIS — Z719 Counseling, unspecified: Secondary | ICD-10-CM

## 2019-05-30 DIAGNOSIS — Z0100 Encounter for examination of eyes and vision without abnormal findings: Secondary | ICD-10-CM | POA: Diagnosis not present

## 2019-06-06 ENCOUNTER — Other Ambulatory Visit: Payer: Self-pay

## 2019-06-11 ENCOUNTER — Other Ambulatory Visit: Payer: Self-pay | Admitting: Internal Medicine

## 2019-06-11 ENCOUNTER — Other Ambulatory Visit: Payer: Self-pay | Admitting: Family Medicine

## 2019-06-11 ENCOUNTER — Encounter: Payer: 59 | Admitting: Plastic Surgery

## 2019-06-11 DIAGNOSIS — B001 Herpesviral vesicular dermatitis: Secondary | ICD-10-CM

## 2019-06-11 MED FILL — valACYclovir HCL 1 GM TABS: 1 | 1 days supply | Qty: 4 | Fill #0

## 2019-06-11 MED FILL — GABAPENTIN 300 MG CAPSULE: 300 | 30 days supply | Qty: 30 | Fill #0

## 2019-06-26 ENCOUNTER — Encounter: Payer: Self-pay | Admitting: Family Medicine

## 2019-06-26 ENCOUNTER — Ambulatory Visit (INDEPENDENT_AMBULATORY_CARE_PROVIDER_SITE_OTHER): Payer: 59 | Admitting: Family Medicine

## 2019-06-26 VITALS — BP 110/78 | HR 78

## 2019-06-26 DIAGNOSIS — M5412 Radiculopathy, cervical region: Secondary | ICD-10-CM

## 2019-06-26 DIAGNOSIS — M999 Biomechanical lesion, unspecified: Secondary | ICD-10-CM

## 2019-06-26 NOTE — Progress Notes (Signed)
[name] [address] [place], [place] [address] Phone: [phone] Subjective:     This visit occurred during the SARS-CoV-2 public health emergency.  Safety protocols were in place, including screening questions prior to the visit, additional usage of staff PPE, and extensive cleaning of exam room while observing appropriate contact time as indicated for disinfecting solutions.     CC: neck pain   [id number]  Priscilla [name] is a 33 y.o. female coming in with complaint of back pain. Last seen on 05/08/2019 for OMT. Patient states continues to have some mild discomfort and pain but no radicular symptoms.  More tightness overall.  Seems to be associated to more stress.     Past Medical History:  Diagnosis Date   Abnormal Pap smear    Hx of varicella    Past Surgical History:  Procedure Laterality Date   CESAREAN SECTION N/A 12/22/2012   Procedure: PRIMARY CESAREAN SECTION;  Surgeon: Darlyn Chamber, MD;  Location: [facility];  Service: Obstetrics;  Laterality: N/A;   COLPOSCOPY     Social History   Socioeconomic History   Marital status: Married    Spouse name: Not on file   Number of children: Not on file   Years of education: Not on file   Highest education level: Not on file  Occupational History   Not on file  Social Needs   Financial resource strain: Not on file   Food insecurity    Worry: Not on file    Inability: Not on file   Transportation needs    Medical: Not on file    Non-medical: Not on file  Tobacco Use   Smoking status: Former Smoker    Quit date: 06/01/2008    Years since quitting: 11.0   Smokeless tobacco: Never Used  Substance and Sexual Activity   Alcohol use: Yes    Alcohol/week: 1.0 standard drinks    Types: 1 Glasses of wine per week   Drug use: No   Sexual activity: Yes  Lifestyle   Physical activity    Days per week: Not on file    Minutes per session: Not on file   Stress: Not on file   Relationships   Social connections    Talks on phone: Not on file    Gets together: Not on file    Attends religious service: Not on file    Active member of club or organization: Not on file    Attends meetings of clubs or organizations: Not on file    Relationship status: Not on file  Other Topics Concern   Not on file  Social History Narrative   Married, one 67 mo old son.   Occupation: Museum/gallery exhibitions officer.   No tobacco.  No alc/drugs.   Caffienated drinks-yes   Seat belt use often-yes   Regular Exercise-yes   Smoke alarm in the home-yes   Firearms/guns in the home-yes   History of physical abuse-no                  No Known Allergies Family History  Problem Relation Age of Onset   Diabetes Mother    Hyperlipidemia Father    Hypertension Father    Diabetes Father    Cancer Maternal Grandmother        leaukemia   Stroke Neg Hx    Early death Neg Hx    Kidney disease Neg Hx     Current Outpatient Medications (Endocrine &  Metabolic):    levonorgestrel (MIRENA) 20 MCG/24HR IUD, 1 each by Intrauterine route once.    Current Outpatient Medications (Analgesics):    meloxicam (MOBIC) 15 MG tablet, Take 1 tablet (15 mg total) by mouth daily.   Current Outpatient Medications (Other):    gabapentin (NEURONTIN) 300 MG capsule, TAKE 1 CAPSULE BY MOUTH NIGHTLY   valACYclovir (VALTREX) 1000 MG tablet, TAKE 2 TABLETS (2,000 MG TOTAL) BY MOUTH 2 (TWO) TIMES DAILY. TAKE OVER ONE DAY.    Past medical history, social, surgical and family history all reviewed in electronic medical record.  No pertanent information unless stated regarding to the chief complaint.   Review of Systems:  No headache, visual changes, nausea, vomiting, diarrhea, constipation, dizziness, abdominal pain, skin rash, fevers, chills, night sweats, weight loss, swollen lymph nodes, body aches, joint swelling, , chest pain, shortness of breath, mood changes.  Positive muscle  aches  Objective  Blood pressure 110/78, pulse 78, SpO2 99 %.    General: No apparent distress alert and oriented x3 mood and affect normal, dressed appropriately.  HEENT: Pupils equal, extraocular movements intact  Respiratory: Patient's speak in full sentences and does not appear short of breath  Cardiovascular: No lower extremity edema, non tender, no erythema  Skin: Warm dry intact with no signs of infection or rash on extremities or on axial skeleton.  Abdomen: Soft nontender  Neuro: Cranial nerves II through XII are intact, neurovascularly intact in all extremities with 2+ DTRs and 2+ pulses.  Lymph: No lymphadenopathy of posterior or anterior cervical chain or axillae bilaterally.  Gait normal with good balance and coordination.  MSK:  tender with full range of motion and good stability and symmetric strength and tone of shoulders, elbows, wrist, hip, knee and ankles bilaterally.  Neck exam does have some mild loss of lordosis but no improvement in range of motion.  No radicular symptoms.  5 out of 5 strength of the upper extremities.  Deep tendon reflexes intact.  Mild tightness in the parascapular region.  Lower back does have some mild tightness noted.  Mild Corky Sox.  Negative straight leg test.  5 out of 5 strength of the lower extremity  Osteopathic findings C4 flexed rotated and side bent left C7 flexed rotated and side bent left T9 extended rotated and side bent left L2 flexed rotated and side bent right Sacrum right on right    Impression and Recommendations:     This case required medical decision making of moderate complexity. The above documentation has been reviewed and is accurate and complete Lyndal Pulley, DO       Note: This dictation was prepared with Dragon dictation along with smaller phrase technology. Any transcriptional errors that result from this process are unintentional.

## 2019-06-26 NOTE — Assessment & Plan Note (Signed)
Stable at the moment.  Has been doing relatively well.  Discussed the possibility of decreasing the gabapentin but patient wants to continue at this time.  Follow-up with me again in 4 to 8 weeks

## 2019-06-26 NOTE — Assessment & Plan Note (Signed)
Decision today to treat with OMT was based on Physical Exam  After verbal consent patient was treated with HVLA, ME, FPR techniques in cervical, thoracic, lumbar and sacral areas  Patient tolerated the procedure well with improvement in symptoms  Patient given exercises, stretches and lifestyle modifications  See medications in patient instructions if given  Patient will follow up in 4-8 weeks 

## 2019-07-18 ENCOUNTER — Other Ambulatory Visit: Payer: Self-pay | Admitting: Family Medicine

## 2019-07-18 MED FILL — GABAPENTIN 300 MG CAPSULE: 300 | 30 days supply | Qty: 30 | Fill #0

## 2019-08-15 DIAGNOSIS — D225 Melanocytic nevi of trunk: Secondary | ICD-10-CM | POA: Diagnosis not present

## 2019-08-15 DIAGNOSIS — D2272 Melanocytic nevi of left lower limb, including hip: Secondary | ICD-10-CM | POA: Diagnosis not present

## 2019-08-15 DIAGNOSIS — L700 Acne vulgaris: Secondary | ICD-10-CM | POA: Diagnosis not present

## 2019-08-15 DIAGNOSIS — D1801 Hemangioma of skin and subcutaneous tissue: Secondary | ICD-10-CM | POA: Diagnosis not present

## 2019-08-15 DIAGNOSIS — D224 Melanocytic nevi of scalp and neck: Secondary | ICD-10-CM | POA: Diagnosis not present

## 2019-08-27 ENCOUNTER — Other Ambulatory Visit: Payer: Self-pay | Admitting: Family Medicine

## 2019-08-27 MED FILL — GABAPENTIN 300 MG CAPSULE: 300 | 30 days supply | Qty: 30 | Fill #0

## 2019-09-05 ENCOUNTER — Other Ambulatory Visit: Payer: Self-pay

## 2019-09-05 ENCOUNTER — Ambulatory Visit (INDEPENDENT_AMBULATORY_CARE_PROVIDER_SITE_OTHER): Payer: 59 | Admitting: Family Medicine

## 2019-09-05 ENCOUNTER — Encounter: Payer: Self-pay | Admitting: Family Medicine

## 2019-09-05 VITALS — BP 110/82 | HR 97 | Ht 62.0 in | Wt 138.0 lb

## 2019-09-05 DIAGNOSIS — M5412 Radiculopathy, cervical region: Secondary | ICD-10-CM | POA: Diagnosis not present

## 2019-09-05 DIAGNOSIS — M999 Biomechanical lesion, unspecified: Secondary | ICD-10-CM

## 2019-09-05 NOTE — Patient Instructions (Signed)
See me again in 2 months °
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
## 2019-09-05 NOTE — Assessment & Plan Note (Signed)
Chronic problem with stable. Responding well to osteopathic manipulation.  Gabapentin more on an as-needed basis at this time.  Discussed other prescription management including meloxicam and is appropriate.  Patient will continue with the home exercises.  Follow-up again in 4 to 8 weeks

## 2019-09-05 NOTE — Assessment & Plan Note (Signed)
Decision today to treat with OMT was based on Physical Exam  After verbal consent patient was treated with HVLA, ME, FPR techniques in cervical, thoracic, lumbar and areas  Patient tolerated the procedure well with improvement in symptoms  Patient given exercises, stretches and lifestyle modifications  See medications in patient instructions if given  Patient will follow up in 4-8 weeks

## 2019-09-08 ENCOUNTER — Encounter: Payer: Self-pay | Admitting: Family Medicine

## 2019-09-09 ENCOUNTER — Other Ambulatory Visit: Payer: Self-pay

## 2019-09-09 MED ORDER — PREDNISONE 50 MG PO TABS: ORAL_TABLET | ORAL | 0 refills | Status: DC

## 2019-09-09 MED FILL — predniSONE 50 MG TABS: 50 | 5 days supply | Qty: 5 | Fill #0

## 2019-10-01 ENCOUNTER — Other Ambulatory Visit: Payer: Self-pay | Admitting: Family Medicine

## 2019-10-01 MED FILL — GABAPENTIN 300 MG CAPSULE: 300 | 30 days supply | Qty: 30 | Fill #0

## 2019-10-29 ENCOUNTER — Other Ambulatory Visit: Payer: Self-pay

## 2019-10-29 ENCOUNTER — Encounter: Payer: Self-pay | Admitting: Family Medicine

## 2019-10-29 ENCOUNTER — Ambulatory Visit (INDEPENDENT_AMBULATORY_CARE_PROVIDER_SITE_OTHER): Payer: 59 | Admitting: Family Medicine

## 2019-10-29 VITALS — BP 102/72 | HR 87 | Ht 62.0 in | Wt 139.0 lb

## 2019-10-29 DIAGNOSIS — M999 Biomechanical lesion, unspecified: Secondary | ICD-10-CM

## 2019-10-29 DIAGNOSIS — M5412 Radiculopathy, cervical region: Secondary | ICD-10-CM | POA: Diagnosis not present

## 2019-10-29 MED ORDER — PREDNISONE 50 MG PO TABS: ORAL_TABLET | ORAL | 0 refills | Status: DC

## 2019-10-29 MED ORDER — METHYLPREDNISOLONE ACETATE 80 MG/ML IJ SUSP
80.0000 mg | Freq: Once | INTRAMUSCULAR | Status: AC
  Administered 2019-10-29: 80 mg via INTRAMUSCULAR

## 2019-10-29 MED ORDER — MELOXICAM 15 MG PO TABS: 15.0000 mg | ORAL_TABLET | Freq: Every day | ORAL | 0 refills | Status: DC

## 2019-10-29 MED ORDER — KETOROLAC TROMETHAMINE 60 MG/2ML IM SOLN
60.0000 mg | Freq: Once | INTRAMUSCULAR | Status: AC
  Administered 2019-10-29: 60 mg via INTRAMUSCULAR

## 2019-10-29 MED ORDER — TIZANIDINE HCL 4 MG PO TABS: 4.0000 mg | ORAL_TABLET | Freq: Every evening | ORAL | 2 refills | Status: AC

## 2019-10-29 MED FILL — predniSONE 50 MG TABS: 50 | 5 days supply | Qty: 5 | Fill #0

## 2019-10-29 MED FILL — MELOXICAM 15 MG TABLET: 15 | 30 days supply | Qty: 30 | Fill #0

## 2019-10-29 MED FILL — tiZANidine HCL 4 MG TABS: 4 | 30 days supply | Qty: 30 | Fill #0
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
## 2019-10-29 NOTE — Patient Instructions (Signed)
Prednisone for next 5 days Meloxicam after prednisone is gone for 10 days then as needed Zanaflex at night See me next week if not better

## 2019-10-29 NOTE — Assessment & Plan Note (Signed)
Chronic problem : Chronic exacerbation  interventions previously, including medication management: Medications including meloxicam, prednisone, gabapentin, physical therapy, advanced imaging including MRI   Interventions this visit: Osteopathic manipulation, discussed medication management including the meloxicam, gabapentin We discussed with patient the importance ergonomics, home exercises, icing regimen, and over-the-counter natural products.   Future considerations but will be based on evaluation and next visit: Questionable epidural    Return to clinic: 4 to 6 weeks

## 2019-10-29 NOTE — Assessment & Plan Note (Signed)
   Decision today to treat with OMT was based on Physical Exam  After verbal consent patient was treated with HVLA, ME, FPR techniques in cervical, thoracic, lumbar and sacral areas, all areas are chronic   Patient tolerated the procedure well with improvement in symptoms  Patient given exercises, stretches and lifestyle modifications  See medications in patient instructions if given  Patient will follow up in 4-6 weeks

## 2019-11-06 ENCOUNTER — Ambulatory Visit (INDEPENDENT_AMBULATORY_CARE_PROVIDER_SITE_OTHER): Payer: 59 | Admitting: Family Medicine

## 2019-11-06 ENCOUNTER — Other Ambulatory Visit: Payer: Self-pay

## 2019-11-06 ENCOUNTER — Encounter: Payer: Self-pay | Admitting: Family Medicine

## 2019-11-06 VITALS — BP 94/70 | HR 74 | Ht 62.0 in | Wt 138.0 lb

## 2019-11-06 DIAGNOSIS — M5412 Radiculopathy, cervical region: Secondary | ICD-10-CM

## 2019-11-06 DIAGNOSIS — Z719 Counseling, unspecified: Secondary | ICD-10-CM

## 2019-11-06 DIAGNOSIS — M999 Biomechanical lesion, unspecified: Secondary | ICD-10-CM
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
## 2019-11-06 NOTE — Assessment & Plan Note (Signed)
   Decision today to treat with OMT was based on Physical Exam  After verbal consent patient was treated with HVLA, ME, FPR techniques in cervical, thoracic,  lumbar and sacral areas, all areas are chronic   Patient tolerated the procedure well with improvement in symptoms  Patient given exercises, stretches and lifestyle modifications  See medications in patient instructions if given  Patient will follow up in 4-8 weeks 

## 2019-11-06 NOTE — Assessment & Plan Note (Signed)
Significant improvement from previous exam.  This is a chronic problem.  Continuing the gabapentin, patient will now progressed to the meloxicam and we discussed this in greater detail.  We will discuss Zanaflex for breakthrough pain.  Follow-up again in 6 weeks

## 2019-11-06 NOTE — Patient Instructions (Signed)
Good to see you Meloxicam for 10 days Calcium Pyruvate 1500 mg daily  Ok to increase activity See me again in 6 weeks

## 2019-11-11 ENCOUNTER — Other Ambulatory Visit: Payer: Self-pay | Admitting: Family Medicine

## 2019-11-11 MED FILL — GABAPENTIN 300 MG CAPSULE: 300 | 30 days supply | Qty: 30 | Fill #0

## 2019-12-16 ENCOUNTER — Other Ambulatory Visit: Payer: Self-pay | Admitting: Family Medicine

## 2019-12-16 MED FILL — GABAPENTIN 300 MG CAPSULE: 300 | 30 days supply | Qty: 30 | Fill #0

## 2019-12-19 ENCOUNTER — Ambulatory Visit: Payer: 59 | Admitting: Family Medicine

## 2020-01-01 DIAGNOSIS — Z719 Counseling, unspecified: Secondary | ICD-10-CM

## 2020-01-17 ENCOUNTER — Other Ambulatory Visit: Payer: Self-pay | Admitting: Family Medicine

## 2020-01-20 ENCOUNTER — Other Ambulatory Visit: Payer: Self-pay | Admitting: Family Medicine

## 2020-01-20 MED FILL — GABAPENTIN 300 MG CAPSULE: 300 | 30 days supply | Qty: 30 | Fill #0

## 2020-01-30 ENCOUNTER — Telehealth: Payer: 59 | Admitting: Emergency Medicine

## 2020-01-30 DIAGNOSIS — R300 Dysuria: Secondary | ICD-10-CM

## 2020-01-30 MED ORDER — CEPHALEXIN 500 MG PO CAPS: 500.0000 mg | ORAL_CAPSULE | Freq: Two times a day (BID) | ORAL | 0 refills | Status: DC

## 2020-01-30 MED FILL — CEPHALEXIN 500 MG CAPSULE: 500 | 7 days supply | Qty: 14 | Fill #0

## 2020-01-30 NOTE — Progress Notes (Signed)

## 2020-02-18 ENCOUNTER — Other Ambulatory Visit: Payer: Self-pay | Admitting: Family Medicine

## 2020-02-18 MED FILL — GABAPENTIN 300 MG CAPSULE: 300 | 30 days supply | Qty: 30 | Fill #0

## 2020-03-11 ENCOUNTER — Other Ambulatory Visit: Payer: Self-pay

## 2020-03-11 ENCOUNTER — Encounter: Payer: Self-pay | Admitting: Family Medicine

## 2020-03-11 ENCOUNTER — Ambulatory Visit (INDEPENDENT_AMBULATORY_CARE_PROVIDER_SITE_OTHER): Payer: 59 | Admitting: Family Medicine

## 2020-03-11 VITALS — BP 110/82 | HR 92 | Ht 62.0 in | Wt 149.0 lb

## 2020-03-11 DIAGNOSIS — M999 Biomechanical lesion, unspecified: Secondary | ICD-10-CM

## 2020-03-11 DIAGNOSIS — M5412 Radiculopathy, cervical region: Secondary | ICD-10-CM

## 2020-03-11 NOTE — Assessment & Plan Note (Signed)
Likely the patient has had all radicular symptoms resolved at this time.  We have had to do steroid burst previously.  We will continue to monitor for symptoms.  Responding well to manipulation.  Follow-up again in 2 to 39-month intervals continue medications

## 2020-03-11 NOTE — Progress Notes (Signed)
[place] Phone: [phone] Subjective:   Priscilla [name], am serving as a scribe for Dr. Hulan Saas. This visit occurred during the SARS-CoV-2 public health emergency.  Safety protocols were in place, including screening questions prior to the visit, additional usage of staff PPE, and extensive cleaning of exam room while observing appropriate contact time as indicated for disinfecting solutions.  I'm seeing this patient by the request  of:  Priscilla Lima, MD  CC: Neck and back pain follow-up  [id number]  Priscilla [name] is a 34 y.o. female coming in with complaint of back and neck pain. OMT 11/06/2019. Patient states   Medications patient has been prescribed: Gabapentin, meloxicam  Taking: Yes         Reviewed prior external information including notes and imaging from previsou exam, outside providers and external EMR if available.   As well as notes that were available from care everywhere and other healthcare systems.  Past medical history, social, surgical and family history all reviewed in electronic medical record.  [name] information unless stated regarding to the chief complaint.   Past Medical History:  Diagnosis Date  . Abnormal Pap smear   . Hx of varicella     [name]   Review of Systems:  [name], visual changes, nausea, vomiting, diarrhea, constipation, dizziness, abdominal pain, skin rash, fevers, chills, night sweats, weight loss, swollen lymph nodes, body aches, joint swelling, chest pain, shortness of breath, mood changes. POSITIVE muscle aches  Objective  There were [name] vitals taken for this visit.   General: [name] alert and oriented x3 mood and affect normal, dressed appropriately.  HEENT: Pupils equal, extraocular movements intact  Respiratory: Patient's speak in full sentences and does not appear short of breath  Cardiovascular: [name]  lower extremity edema, non tender, [name] erythema  Neuro: Cranial nerves II through XII are intact, neurovascularly intact in all extremities with 2+ DTRs and 2+ pulses.  Gait normal with good balance and coordination.  MSK:  Non tender with full range of motion and good stability and symmetric strength and tone of shoulders, elbows, wrist, hip, knee and ankles bilaterally.  Back -neck exam does have some very mild loss of lordosis.  The low back does have some mild tightness of more the sacroiliac joint.  Otherwise fairly unremarkable  Osteopathic findings  C2 flexed rotated and side bent right C6 flexed rotated and side bent left T8 extended rotated and side bent left L2 flexed rotated and side bent right Sacrum right on right       Assessment and Plan:  Cervical radiculopathy at C8 Likely the patient has had all radicular symptoms resolved at this time.  We have had to do steroid burst previously.  We will continue to monitor for symptoms.  Responding well to manipulation.  Follow-up again in 2 to 32-month intervals continue medications    Nonallopathic problems  Decision today to treat with OMT was based on Physical Exam  After verbal consent patient was treated with HVLA, ME, FPR techniques in cervical, rib, thoracic, lumbar, and sacral  areas  Patient tolerated the procedure well with improvement in symptoms  Patient given exercises, stretches and lifestyle modifications  See medications in patient instructions if given  Patient will follow up in 4-8 weeks      The above documentation has been reviewed and is accurate and complete Priscilla Pulley, DO  Note: This dictation was prepared with Dragon dictation along with smaller phrase technology. Any transcriptional errors that result from this process are unintentional.

## 2020-03-18 ENCOUNTER — Other Ambulatory Visit: Payer: Self-pay | Admitting: Family Medicine

## 2020-03-18 MED FILL — GABAPENTIN 300 MG CAPSULE: 300 | 30 days supply | Qty: 30 | Fill #0

## 2020-03-24 ENCOUNTER — Encounter: Payer: 59 | Admitting: Internal Medicine

## 2020-03-31 ENCOUNTER — Encounter: Payer: 59 | Admitting: Internal Medicine

## 2020-04-02 ENCOUNTER — Other Ambulatory Visit: Payer: Self-pay | Admitting: Internal Medicine

## 2020-04-02 ENCOUNTER — Other Ambulatory Visit: Payer: Self-pay

## 2020-04-02 ENCOUNTER — Ambulatory Visit (INDEPENDENT_AMBULATORY_CARE_PROVIDER_SITE_OTHER): Payer: 59 | Admitting: Internal Medicine

## 2020-04-02 ENCOUNTER — Encounter: Payer: Self-pay | Admitting: Internal Medicine

## 2020-04-02 VITALS — BP 122/74 | HR 91 | Temp 98.8°F | Ht 62.0 in | Wt 152.0 lb

## 2020-04-02 DIAGNOSIS — Z0000 Encounter for general adult medical examination without abnormal findings: Secondary | ICD-10-CM

## 2020-04-02 DIAGNOSIS — B001 Herpesviral vesicular dermatitis: Secondary | ICD-10-CM

## 2020-04-02 DIAGNOSIS — R635 Abnormal weight gain: Secondary | ICD-10-CM

## 2020-04-02 DIAGNOSIS — Z23 Encounter for immunization: Secondary | ICD-10-CM

## 2020-04-02 MED ORDER — VALACYCLOVIR HCL 1 G PO TABS: 2000.0000 mg | ORAL_TABLET | Freq: Two times a day (BID) | ORAL | 1 refills | Status: DC

## 2020-04-02 MED FILL — valACYclovir HCL 1 GM TABS: 1 | 1 days supply | Qty: 4 | Fill #0

## 2020-04-02 NOTE — Patient Instructions (Signed)
Health Maintenance, Female Adopting a healthy lifestyle and getting preventive care are important in promoting health and wellness. Ask your health care provider about:  The right schedule for you to have regular tests and exams.  Things you can do on your own to prevent diseases and keep yourself healthy. What should I know about diet, weight, and exercise? Eat a healthy diet   Eat a diet that includes plenty of vegetables, fruits, low-fat dairy products, and lean protein.  Do not eat a lot of foods that are high in solid fats, added sugars, or sodium. Maintain a healthy weight Body mass index (BMI) is used to identify weight problems. It estimates body fat based on height and weight. Your health care provider can help determine your BMI and help you achieve or maintain a healthy weight. Get regular exercise Get regular exercise. This is one of the most important things you can do for your health. Most adults should:  Exercise for at least 150 minutes each week. The exercise should increase your heart rate and make you sweat (moderate-intensity exercise).  Do strengthening exercises at least twice a week. This is in addition to the moderate-intensity exercise.  Spend less time sitting. Even light physical activity can be beneficial. Watch cholesterol and blood lipids Have your blood tested for lipids and cholesterol at 34 years of age, then have this test every 5 years. Have your cholesterol levels checked more often if:  Your lipid or cholesterol levels are high.  You are older than 34 years of age.  You are at high risk for heart disease. What should I know about cancer screening? Depending on your health history and family history, you may need to have cancer screening at various ages. This may include screening for:  Breast cancer.  Cervical cancer.  Colorectal cancer.  Skin cancer.  Lung cancer. What should I know about heart disease, diabetes, and high blood  pressure? Blood pressure and heart disease  High blood pressure causes heart disease and increases the risk of stroke. This is more likely to develop in people who have high blood pressure readings, are of African descent, or are overweight.  Have your blood pressure checked: ? Every 3-5 years if you are 18-39 years of age. ? Every year if you are 40 years old or older. Diabetes Have regular diabetes screenings. This checks your fasting blood sugar level. Have the screening done:  Once every three years after age 40 if you are at a normal weight and have a low risk for diabetes.  More often and at a younger age if you are overweight or have a high risk for diabetes. What should I know about preventing infection? Hepatitis B If you have a higher risk for hepatitis B, you should be screened for this virus. Talk with your health care provider to find out if you are at risk for hepatitis B infection. Hepatitis C Testing is recommended for:  Everyone born from 1945 through 1965.  Anyone with known risk factors for hepatitis C. Sexually transmitted infections (STIs)  Get screened for STIs, including gonorrhea and chlamydia, if: ? You are sexually active and are younger than 34 years of age. ? You are older than 34 years of age and your health care provider tells you that you are at risk for this type of infection. ? Your sexual activity has changed since you were last screened, and you are at increased risk for chlamydia or gonorrhea. Ask your health care provider if   you are at risk.  Ask your health care provider about whether you are at high risk for HIV. Your health care provider may recommend a prescription medicine to help prevent HIV infection. If you choose to take medicine to prevent HIV, you should first get tested for HIV. You should then be tested every 3 months for as long as you are taking the medicine. Pregnancy  If you are about to stop having your period (premenopausal) and  you may become pregnant, seek counseling before you get pregnant.  Take 400 to 800 micrograms (mcg) of folic acid every day if you become pregnant.  Ask for birth control (contraception) if you want to prevent pregnancy. Osteoporosis and menopause Osteoporosis is a disease in which the bones lose minerals and strength with aging. This can result in bone fractures. If you are 65 years old or older, or if you are at risk for osteoporosis and fractures, ask your health care provider if you should:  Be screened for bone loss.  Take a calcium or vitamin D supplement to lower your risk of fractures.  Be given hormone replacement therapy (HRT) to treat symptoms of menopause. Follow these instructions at home: Lifestyle  Do not use any products that contain nicotine or tobacco, such as cigarettes, e-cigarettes, and chewing tobacco. If you need help quitting, ask your health care provider.  Do not use street drugs.  Do not share needles.  Ask your health care provider for help if you need support or information about quitting drugs. Alcohol use  Do not drink alcohol if: ? Your health care provider tells you not to drink. ? You are pregnant, may be pregnant, or are planning to become pregnant.  If you drink alcohol: ? Limit how much you use to 0-1 drink a day. ? Limit intake if you are breastfeeding.  Be aware of how much alcohol is in your drink. In the [place] one drink equals one 12 oz bottle of beer (355 mL), one 5 oz glass of wine (148 mL), or one 1 oz glass of hard liquor (44 mL). General instructions  Schedule regular health, dental, and eye exams.  Stay current with your vaccines.  Tell your health care provider if: ? You often feel depressed. ? You have ever been abused or do not feel safe at home. Summary  Adopting a healthy lifestyle and getting preventive care are important in promoting health and wellness.  Follow your health care provider's instructions about healthy  diet, exercising, and getting tested or screened for diseases.  Follow your health care provider's instructions on monitoring your cholesterol and blood pressure. This information is not intended to replace advice given to you by your health care provider. Make sure you discuss any questions you have with your health care provider. Document Revised: 07/11/2018 Document Reviewed: 07/11/2018 Elsevier Patient Education  2020 Elsevier Inc.  

## 2020-04-02 NOTE — Progress Notes (Signed)
Subjective:  Patient ID: Priscilla [name], female    DOB: 13-Mar-1986  Age: 34 y.o. MRN: [id number]  CC: Annual Exam  This visit occurred during the SARS-CoV-2 public health emergency.  Safety protocols were in place, including screening questions prior to the visit, additional usage of staff PPE, and extensive cleaning of exam room while observing appropriate contact time as indicated for disinfecting solutions.    HPI Priscilla [name] presents for a CPX.  She complains of weight gain but offers no other complaints.  During the pandemic she has not been exercising as much as she usually does.  Outpatient Medications Prior to Visit  Medication Sig Dispense Refill  . gabapentin (NEURONTIN) 300 MG capsule TAKE 1 CAPSULE BY MOUTH EVERY NIGHT 30 capsule 0  . levonorgestrel (MIRENA) 20 MCG/24HR IUD 1 each by Intrauterine route once.    . meloxicam (MOBIC) 15 MG tablet Take 1 tablet (15 mg total) by mouth daily. 30 tablet 0  . valACYclovir (VALTREX) 1000 MG tablet TAKE 2 TABLETS (2,000 MG TOTAL) BY MOUTH 2 (TWO) TIMES DAILY. TAKE OVER ONE DAY. 4 tablet 1  . cephALEXin (KEFLEX) 500 MG capsule Take 1 capsule (500 mg total) by mouth 2 (two) times daily. (Patient not taking: Reported on 04/02/2020) 14 capsule 0  . predniSONE (DELTASONE) 50 MG tablet Take one tablet daily for the next 5 days. (Patient not taking: Reported on 04/02/2020) 5 tablet 0   No facility-administered medications prior to visit.    ROS Review of Systems  Constitutional: Positive for unexpected weight change. Negative for appetite change, chills, diaphoresis and fatigue.  HENT: Negative.   Eyes: Negative for visual disturbance.  Respiratory: Negative for cough, chest tightness, shortness of breath and wheezing.   Cardiovascular: Negative for chest pain, palpitations and leg swelling.  Gastrointestinal: Negative for abdominal pain, constipation, diarrhea, nausea and vomiting.  Endocrine: Negative for cold intolerance and heat  intolerance.  Genitourinary: Negative.  Negative for difficulty urinating.  Musculoskeletal: Negative for arthralgias and myalgias.  Skin: Negative.  Negative for color change and pallor.  Neurological: Negative for dizziness, weakness and light-headedness.  Hematological: Negative for adenopathy. Does not bruise/bleed easily.  Psychiatric/Behavioral: Negative.     Objective:  BP 122/74   Pulse 91   Temp 98.8 F (37.1 C) (Oral)   Ht 5\' 2"  (1.575 m)   Wt 152 lb (68.9 kg)   SpO2 99%   BMI 27.80 kg/m   BP Readings from Last 3 Encounters:  04/02/20 122/74  03/11/20 110/82  11/06/19 94/70    Wt Readings from Last 3 Encounters:  04/02/20 152 lb (68.9 kg)  03/11/20 149 lb (67.6 kg)  11/06/19 138 lb (62.6 kg)    Physical Exam Vitals reviewed.  Constitutional:      Appearance: Normal appearance.  HENT:     Nose: Nose normal.     Mouth/Throat:     Mouth: Mucous membranes are moist.  Eyes:     General: No scleral icterus.    Conjunctiva/sclera: Conjunctivae normal.  Cardiovascular:     Rate and Rhythm: Normal rate and regular rhythm.     Heart sounds: No murmur heard.   Pulmonary:     Effort: Pulmonary effort is normal.     Breath sounds: No stridor. No wheezing, rhonchi or rales.  Abdominal:     General: Abdomen is flat. Bowel sounds are normal. There is no distension.     Palpations: Abdomen is soft. There is no hepatomegaly, splenomegaly or mass.  Musculoskeletal:        General: Normal range of motion.     Cervical back: Neck supple.     Right lower leg: No edema.     Left lower leg: No edema.  Lymphadenopathy:     Cervical: No cervical adenopathy.  Skin:    General: Skin is warm and dry.     Coloration: Skin is not pale.  Neurological:     General: No focal deficit present.     Mental Status: She is alert.  Psychiatric:        Mood and Affect: Mood normal.        Behavior: Behavior normal.     Lab Results  Component Value Date   WBC 8.7 04/02/2020     HGB 14.2 04/02/2020   HCT 42.1 04/02/2020   PLT 311 04/02/2020   GLUCOSE 91 04/02/2020   CHOL 188 11/15/2017   TRIG 96.0 11/15/2017   HDL 81.60 11/15/2017   LDLCALC 87 11/15/2017   ALT 19 10/13/2015   AST 20 10/13/2015   NA 139 04/02/2020   K 4.9 04/02/2020   CL 105 04/02/2020   CREATININE 0.83 04/02/2020   BUN 15 04/02/2020   CO2 26 04/02/2020   TSH 1.23 04/02/2020   PSA GYN 03/20/2018   HGBA1C 5.4 11/15/2017    MR Cervical Spine Wo Contrast  Result Date: 07/11/2018 CLINICAL DATA:  Cervicalgia EXAM: MRI CERVICAL SPINE WITHOUT CONTRAST TECHNIQUE: Multiplanar, multisequence MR imaging of the cervical spine was performed. No intravenous contrast was administered. COMPARISON:  None. FINDINGS: Alignment: Normal Vertebrae: Normal bone marrow.  Negative for fracture or mass. Cord: Normal signal and morphology. Posterior Fossa, vertebral arteries, paraspinal tissues: Negative. Disc levels: C2-3: Negative C3-4: Negative C4-5: Negative C5-6: Small to moderate right paracentral disc protrusion. 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 Neural foramina patent bilaterally. C6-7: Negative C7-T1: Negative IMPRESSION: Small to moderate right paracentral disc protrusion at C5-6 with mild cord flattening. Otherwise negative Electronically Signed   By: Franchot Gallo M.D.   On: 07/11/2018 09:49    Assessment & Plan:   [name] was seen today for annual exam.  Diagnoses and all orders for this visit:  Weight gain, abnormal- Her labs are negative for secondary causes.  She is committed to improving her lifestyle modifications. -     CBC with Differential/Platelet; Future -     TSH; Future -     BASIC METABOLIC PANEL WITH GFR; Future -     BASIC METABOLIC PANEL WITH GFR -     TSH -     CBC with Differential/Platelet  Herpes labialis without complication -     valACYclovir (VALTREX) 1000 MG tablet; Take 2 tablets (2,000 mg total) by mouth 2 (two) times daily. Take over one  day. -     CBC with Differential/Platelet; Future -     BASIC METABOLIC PANEL WITH GFR; Future -     BASIC METABOLIC PANEL WITH GFR -     CBC with Differential/Platelet  Routine general medical examination at a health care facility- Exam completed, labs reviewed, vaccines reviewed and updated, cervical cancer screening is up-to-date, patient education was given.  Other orders -     Flu Vaccine QUAD 6+ mos PF IM (Fluarix Quad PF)   I am having [name] maintain her levonorgestrel, predniSONE, meloxicam, cephALEXin, gabapentin, and valACYclovir.  Meds ordered this encounter  Medications  . valACYclovir (VALTREX) 1000 MG tablet    Sig:  Take 2 tablets (2,000 mg total) by mouth 2 (two) times daily. Take over one day.    Dispense:  4 tablet    Refill:  1     Follow-up: Return if symptoms worsen or fail to improve.  [name], MD

## 2020-04-03 LAB — CBC WITH DIFFERENTIAL/PLATELET
Absolute Monocytes: 531 cells/uL (ref 200–950)
Basophils Absolute: 52 cells/uL (ref 0–200)
Basophils Relative: 0.6 %
Eosinophils Absolute: 52 cells/uL (ref 15–500)
Eosinophils Relative: 0.6 %
HCT: 42.1 % (ref 35.0–45.0)
Hemoglobin: 14.2 g/dL (ref 11.7–15.5)
Lymphs Abs: 2219 cells/uL (ref 850–3900)
MCH: 31.8 pg (ref 27.0–33.0)
MCHC: 33.7 g/dL (ref 32.0–36.0)
MCV: 94.2 fL (ref 80.0–100.0)
MPV: 9.9 fL (ref 7.5–12.5)
Monocytes Relative: 6.1 %
Neutro Abs: 5846 cells/uL (ref 1500–7800)
Neutrophils Relative %: 67.2 %
Platelets: 311 10*3/uL (ref 140–400)
RBC: 4.47 10*6/uL (ref 3.80–5.10)
RDW: 11.5 % (ref 11.0–15.0)
Total Lymphocyte: 25.5 %
WBC: 8.7 10*3/uL (ref 3.8–10.8)

## 2020-04-03 LAB — BASIC METABOLIC PANEL WITH GFR
BUN: 15 mg/dL (ref 7–25)
CO2: 26 mmol/L (ref 20–32)
Calcium: 10 mg/dL (ref 8.6–10.2)
Chloride: 105 mmol/L (ref 98–110)
Creat: 0.83 mg/dL (ref 0.50–1.10)
GFR, Est African American: 107 mL/min/{1.73_m2} (ref >=60)
GFR, Est Non African American: 92 mL/min/{1.73_m2} (ref >=60)
Glucose, Bld: 91 mg/dL (ref 65–99)
Potassium: 4.9 mmol/L (ref 3.5–5.3)
Sodium: 139 mmol/L (ref 135–146)

## 2020-04-03 LAB — TSH: TSH: 1.23 mIU/L

## 2020-04-20 ENCOUNTER — Other Ambulatory Visit: Payer: Self-pay | Admitting: Family Medicine

## 2020-04-20 MED ORDER — GABAPENTIN 300 MG PO CAPS
300.0000 mg | ORAL_CAPSULE | Freq: Every day | ORAL | 1 refills | Status: DC
Start: 2020-04-20 — End: 2020-07-07

## 2020-04-20 MED FILL — GABAPENTIN 300 MG CAPSULE: 300 | 30 days supply | Qty: 30 | Fill #0

## 2020-05-04 DIAGNOSIS — Z6827 Body mass index (BMI) 27.0-27.9, adult: Secondary | ICD-10-CM | POA: Diagnosis not present

## 2020-05-04 DIAGNOSIS — Z01419 Encounter for gynecological examination (general) (routine) without abnormal findings: Secondary | ICD-10-CM | POA: Diagnosis not present

## 2020-05-12 MED FILL — valACYclovir HCL 1 GM TABS: 1 | 1 days supply | Qty: 4 | Fill #1

## 2020-05-13 ENCOUNTER — Encounter: Payer: Self-pay | Admitting: Family Medicine

## 2020-05-13 ENCOUNTER — Other Ambulatory Visit: Payer: Self-pay

## 2020-05-13 ENCOUNTER — Ambulatory Visit (INDEPENDENT_AMBULATORY_CARE_PROVIDER_SITE_OTHER): Payer: 59 | Admitting: Family Medicine

## 2020-05-13 VITALS — BP 122/90 | HR 76 | Ht 62.0 in | Wt 152.0 lb

## 2020-05-13 DIAGNOSIS — M5412 Radiculopathy, cervical region: Secondary | ICD-10-CM | POA: Diagnosis not present

## 2020-05-13 DIAGNOSIS — M999 Biomechanical lesion, unspecified: Secondary | ICD-10-CM | POA: Diagnosis not present

## 2020-05-13 NOTE — Assessment & Plan Note (Signed)
Stable at the moment.  Discussed posture and ergonomics, discussed which activities to do which wants to avoid.  Increase in activity slowly over the course of the next several weeks.  Follow-up again 6 to 12 weeks

## 2020-05-13 NOTE — Progress Notes (Signed)
[place] Phone: [phone] Subjective:   [name], am serving as a scribe for Dr. Hulan Saas. This visit occurred during the SARS-CoV-2 public health emergency.  Safety protocols were in place, including screening questions prior to the visit, additional usage of staff PPE, and extensive cleaning of exam room while observing appropriate contact time as indicated for disinfecting solutions.   I'm seeing this patient by the request  of:  Janith Lima, MD  CC: Low back and neck pain follow-up  [id number]  DAWNA JAKES is a 34 y.o. female coming in with complaint of back and neck pain. OMT 03/11/2020. Patient states that overall doing relatively well.  Some mild discomfort and pain.  More of a tightness.  Has not been able to workout on a as a regular basis at the moment.  Medications patient has been prescribed: Mobic, gabapentin          Reviewed prior external information including notes and imaging from previsou exam, outside providers and external EMR if available.   As well as notes that were available from care everywhere and other healthcare systems.  Past medical history, social, surgical and family history all reviewed in electronic medical record.  No pertanent information unless stated regarding to the chief complaint.   Past Medical History:  Diagnosis Date  . Abnormal Pap smear   . Hx of varicella     No Known Allergies   Review of Systems:  No headache, visual changes, nausea, vomiting, diarrhea, constipation, dizziness, abdominal pain, skin rash, fevers, chills, night sweats, weight loss, swollen lymph nodes, body aches, joint swelling, chest pain, shortness of breath, mood changes. POSITIVE muscle aches  Objective  Blood pressure 122/90, pulse 76, height 5\' 2"  (1.575 m), weight 152 lb (68.9 kg), SpO2 99 %.   General: No apparent distress alert and oriented x3 mood and  affect normal, dressed appropriately.  HEENT: Pupils equal, extraocular movements intact  Respiratory: Patient's speak in full sentences and does not appear short of breath  Cardiovascular: No lower extremity edema, non tender, no erythema  Neuro: Cranial nerves II through XII are intact, neurovascularly intact in all extremities with 2+ DTRs and 2+ pulses.  Gait normal with good balance and coordination.  MSK:  Non tender with full range of motion and good stability and symmetric strength and tone of shoulders, elbows, wrist, hip, knee and ankles bilaterally.  Back -low back exam does show some mild loss of lordosis.  Some tenderness to palpation in the paraspinal musculature right greater than left.  Negative straight leg test.  Neck exam still has some mild loss of lordosis as well but negative Spurling's.  Osteopathic findings  C6 flexed rotated and side bent left C7 flexed rotated and side bent right T4 extended rotated and side bent left inhaled rib L1 flexed rotated and side bent right Sacrum right on right       Assessment and Plan:  Cervical radiculopathy at C8 Stable at the moment.  Discussed posture and ergonomics, discussed which activities to do which wants to avoid.  Increase in activity slowly over the course of the next several weeks.  Follow-up again 6 to 12 weeks    Nonallopathic problems  Decision today to treat with OMT was based on Physical Exam  After verbal consent patient was treated with HVLA, ME, FPR techniques in cervical, rib, thoracic, lumbar, and sacral  areas  Patient tolerated the procedure  well with improvement in symptoms  Patient given exercises, stretches and lifestyle modifications  See medications in patient instructions if given  Patient will follow up in 4-8 weeks      The above documentation has been reviewed and is accurate and complete Lyndal Pulley, DO       Note: This dictation was prepared with Dragon dictation along  with smaller phrase technology. Any transcriptional errors that result from this process are unintentional.

## 2020-05-21 DIAGNOSIS — Z719 Counseling, unspecified: Secondary | ICD-10-CM

## 2020-06-03 MED FILL — GABAPENTIN 300 MG CAPSULE: 300 | 30 days supply | Qty: 30 | Fill #1

## 2020-06-05 DIAGNOSIS — Z719 Counseling, unspecified: Secondary | ICD-10-CM

## 2020-06-16 DIAGNOSIS — Z9889 Other specified postprocedural states: Secondary | ICD-10-CM | POA: Diagnosis not present

## 2020-06-16 DIAGNOSIS — Z0100 Encounter for examination of eyes and vision without abnormal findings: Secondary | ICD-10-CM | POA: Diagnosis not present

## 2020-06-22 ENCOUNTER — Other Ambulatory Visit: Payer: Self-pay | Admitting: Family

## 2020-06-22 ENCOUNTER — Telehealth: Payer: 59 | Admitting: Family

## 2020-06-22 DIAGNOSIS — R399 Unspecified symptoms and signs involving the genitourinary system: Secondary | ICD-10-CM | POA: Diagnosis not present

## 2020-06-22 MED ORDER — CEPHALEXIN 500 MG PO CAPS: 500.0000 mg | ORAL_CAPSULE | Freq: Two times a day (BID) | ORAL | 0 refills | Status: DC

## 2020-06-22 NOTE — Progress Notes (Signed)

## 2020-06-23 MED FILL — CEPHALEXIN 500 MG CAPSULE: 500 | 7 days supply | Qty: 14 | Fill #0

## 2020-07-01 ENCOUNTER — Ambulatory Visit: Payer: 59 | Admitting: Family Medicine

## 2020-07-06 ENCOUNTER — Other Ambulatory Visit: Payer: Self-pay | Admitting: Family Medicine

## 2020-07-07 ENCOUNTER — Other Ambulatory Visit: Payer: Self-pay | Admitting: Family Medicine

## 2020-07-07 MED FILL — GABAPENTIN 300 MG CAPSULE: 300 | 30 days supply | Qty: 30 | Fill #0

## 2020-07-29 ENCOUNTER — Ambulatory Visit (INDEPENDENT_AMBULATORY_CARE_PROVIDER_SITE_OTHER): Payer: 59 | Admitting: Family Medicine

## 2020-07-29 ENCOUNTER — Other Ambulatory Visit: Payer: Self-pay

## 2020-07-29 ENCOUNTER — Encounter: Payer: Self-pay | Admitting: Family Medicine

## 2020-07-29 VITALS — BP 110/76 | HR 85 | Ht 62.0 in | Wt 157.0 lb

## 2020-07-29 DIAGNOSIS — M24559 Contracture, unspecified hip: Secondary | ICD-10-CM | POA: Diagnosis not present

## 2020-07-29 DIAGNOSIS — M999 Biomechanical lesion, unspecified: Secondary | ICD-10-CM | POA: Diagnosis not present

## 2020-07-29 NOTE — Assessment & Plan Note (Signed)
Continue tightness.  We discussed with patient in great length about icing regimen, home exercises.  The patient does have the gabapentin more for the neck as well as the meloxicam for breakthrough.  I believe patient should do relatively well overall.  Follow-up with me again in 2 to 3 months

## 2020-07-29 NOTE — Progress Notes (Signed)
[place] Phone: [phone] Subjective:   Priscilla [name], am serving as a scribe for Dr. Hulan Saas. This visit occurred during the SARS-CoV-2 public health emergency.  Safety protocols were in place, including screening questions prior to the visit, additional usage of staff PPE, and extensive cleaning of exam room while observing appropriate contact time as indicated for disinfecting solutions.   I'm seeing this patient by the request  of:  Priscilla Lima, MD  CC: Back and neck pain follow-up  [id number]  Priscilla [name] is a 34 y.o. female coming in with complaint of back and neck pain. OMT 05/13/2020. Patient states that her neck is doing good. Having increase in back pain due to a workout she did yesterday.  More tightness in the lower back.  Nothing that stops her from activity but still gets some discomfort.  Patient did have to reschedule with Covid in her child.  Medications patient has been prescribed: Gabapentin          Reviewed prior external information including notes and imaging from previsou exam, outside providers and external EMR if available.   As well as notes that were available from care everywhere and other healthcare systems.  Past medical history, social, surgical and family history all reviewed in electronic medical record.  [name] information unless stated regarding to the chief complaint.   Past Medical History:  Diagnosis Date  . Abnormal Pap smear   . Hx of varicella     [name]   Review of Systems:  [name], visual changes, nausea, vomiting, diarrhea, constipation, dizziness, abdominal pain, skin rash, fevers, chills, night sweats, weight loss, swollen lymph nodes, body aches, joint swelling, chest pain, shortness of breath, mood changes. POSITIVE muscle aches  Objective  Blood pressure 110/76, pulse 85, height 5\' 2"  (1.575 m), weight 157 lb  (71.2 kg), SpO2 98 %.   General: [name] alert and oriented x3 mood and affect normal, dressed appropriately.  HEENT: Pupils equal, extraocular movements intact  Respiratory: Patient's speak in full sentences and does not appear short of breath  Cardiovascular: [name] lower extremity edema, non tender, [name] erythema  Neuro: Cranial nerves II through XII are intact, neurovascularly intact in all extremities with 2+ DTRs and 2+ pulses.  Gait normal with good balance and coordination.  MSK:  Non tender with full range of motion and good stability and symmetric strength and tone of shoulders, elbows, wrist, hip, knee and ankles bilaterally.  Back - tightness noted in paraspinal m  Osteopathic findings  C6 flexed rotated and side bent left T3 extended rotated and side bent left inhaled rib L2 flexed rotated and side bent right L5 flexed rotated and side bent left Sacrum right on right       Assessment and Plan: Hip flexor tightness Continue tightness.  We discussed with patient in great length about icing regimen, home exercises.  The patient does have the gabapentin more for the neck as well as the meloxicam for breakthrough.  I believe patient should do relatively well overall.  Follow-up with me again in 2 to 3 months     Nonallopathic problems  Decision today to treat with OMT was based on Physical Exam  After verbal consent patient was treated with HVLA, ME, FPR techniques in cervical,  thoracic, lumbar, and sacral  areas  Patient tolerated the procedure well with improvement in symptoms  Patient given exercises, stretches  and lifestyle modifications  See medications in patient instructions if given  Patient will follow up in 8 weeks      The above documentation has been reviewed and is accurate and complete Judi Saa, DO       Note: This dictation was prepared with Dragon dictation along with smaller phrase technology. Any transcriptional errors that  result from this process are unintentional.

## 2020-08-05 MED FILL — GABAPENTIN 300 MG CAPSULE: 300 | 30 days supply | Qty: 30 | Fill #1

## 2020-08-20 DIAGNOSIS — D485 Neoplasm of uncertain behavior of skin: Secondary | ICD-10-CM | POA: Diagnosis not present

## 2020-08-20 DIAGNOSIS — D2262 Melanocytic nevi of left upper limb, including shoulder: Secondary | ICD-10-CM | POA: Diagnosis not present

## 2020-08-20 DIAGNOSIS — L814 Other melanin hyperpigmentation: Secondary | ICD-10-CM | POA: Diagnosis not present

## 2020-08-20 DIAGNOSIS — D2261 Melanocytic nevi of right upper limb, including shoulder: Secondary | ICD-10-CM | POA: Diagnosis not present

## 2020-08-20 DIAGNOSIS — D1801 Hemangioma of skin and subcutaneous tissue: Secondary | ICD-10-CM | POA: Diagnosis not present

## 2020-08-20 DIAGNOSIS — D2272 Melanocytic nevi of left lower limb, including hip: Secondary | ICD-10-CM | POA: Diagnosis not present

## 2020-08-20 DIAGNOSIS — D224 Melanocytic nevi of scalp and neck: Secondary | ICD-10-CM | POA: Diagnosis not present

## 2020-08-20 DIAGNOSIS — L700 Acne vulgaris: Secondary | ICD-10-CM | POA: Diagnosis not present

## 2020-08-20 DIAGNOSIS — D225 Melanocytic nevi of trunk: Secondary | ICD-10-CM | POA: Diagnosis not present

## 2020-09-16 ENCOUNTER — Other Ambulatory Visit: Payer: Self-pay | Admitting: Family Medicine

## 2020-09-16 MED FILL — GABAPENTIN 300 MG CAPSULE: 300 | 30 days supply | Qty: 30 | Fill #0

## 2020-09-23 ENCOUNTER — Ambulatory Visit: Payer: 59 | Admitting: Family Medicine

## 2020-09-30 ENCOUNTER — Ambulatory Visit: Payer: 59 | Admitting: Family Medicine

## 2020-10-05 ENCOUNTER — Ambulatory Visit: Payer: 59 | Admitting: Family Medicine

## 2020-10-06 NOTE — Progress Notes (Signed)
[place] Phone: [phone] Subjective:   [name], am serving as a scribe for Dr. Hulan Saas. This visit occurred during the SARS-CoV-2 public health emergency.  Safety protocols were in place, including screening questions prior to the visit, additional usage of staff PPE, and extensive cleaning of exam room while observing appropriate contact time as indicated for disinfecting solutions.   I'm seeing this patient by the request  of:  Janith Lima, MD  CC: Neck and back pain follow-up  [id number]  Priscilla [name] is a 35 y.o. female coming in with complaint of back and neck pain. OMT 07/29/2020. Patient states   Medications patient has been prescribed: Gabapentin  Taking:         Reviewed prior external information including notes and imaging from previsou exam, outside providers and external EMR if available.   As well as notes that were available from care everywhere and other healthcare systems.  Past medical history, social, surgical and family history all reviewed in electronic medical record.  No pertanent information unless stated regarding to the chief complaint.   Past Medical History:  Diagnosis Date  . Abnormal Pap smear   . Hx of varicella     No Known Allergies   Review of Systems:  No headache, visual changes, nausea, vomiting, diarrhea, constipation, dizziness, abdominal pain, skin rash, fevers, chills, night sweats, weight loss, swollen lymph nodes, body aches, joint swelling, chest pain, shortness of breath, mood changes. POSITIVE muscle aches  Objective  Blood pressure 112/80, pulse 85, height 5\' 2"  (1.575 m), weight 163 lb (73.9 kg), SpO2 98 %.   General: No apparent distress alert and oriented x3 mood and affect normal, dressed appropriately.  HEENT: Pupils equal, extraocular movements intact  Respiratory: Patient's speak in full sentences and does not appear  short of breath  Cardiovascular: No lower extremity edema, non tender, no erythema .  Gait normal with good balance and coordination.  MSK:  Non tender with full range of motion and good stability and symmetric strength and tone of shoulders, elbows, wrist, hip, knee and ankles bilaterally.  Back -   Osteopathic findings  C6 flexed rotated and side bent left T3 extended rotated and side bent left inhaled rib L5 flexed rotated and side bent left  Sacrum right on right       Assessment and Plan:  Hip flexor tightness Continues to have some mild tightness.  Likely some is secondary to sitting at work.  Patient states some mild discomfort from time to time but nothing severe.  No longer having significant radicular symptoms in either.  Responding well to manipulation follow-up again in 8 weeks  Cervical radiculopathy at C8 Cervical radiculopathy is not occurring at this time.  He does have the gabapentin.  No change in medication or management.  Follow-up with me again 8 weeks.    Nonallopathic problems  Decision today to treat with OMT was based on Physical Exam  After verbal consent patient was treated with HVLA, ME, FPR techniques in cervical, rib, thoracic, lumbar, and sacral  areas  Patient tolerated the procedure well with improvement in symptoms  Patient given exercises, stretches and lifestyle modifications  See medications in patient instructions if given  Patient will follow up in 4-8 weeks      The above documentation has been reviewed and is accurate and complete Lyndal Pulley, DO       Note: This  dictation was prepared with Dragon dictation along with smaller phrase technology. Any transcriptional errors that result from this process are unintentional.

## 2020-10-07 ENCOUNTER — Other Ambulatory Visit: Payer: Self-pay

## 2020-10-07 ENCOUNTER — Ambulatory Visit (INDEPENDENT_AMBULATORY_CARE_PROVIDER_SITE_OTHER): Payer: 59 | Admitting: Family Medicine

## 2020-10-07 ENCOUNTER — Encounter: Payer: Self-pay | Admitting: Family Medicine

## 2020-10-07 VITALS — BP 112/80 | HR 85 | Ht 62.0 in | Wt 163.0 lb

## 2020-10-07 DIAGNOSIS — M5412 Radiculopathy, cervical region: Secondary | ICD-10-CM

## 2020-10-07 DIAGNOSIS — M999 Biomechanical lesion, unspecified: Secondary | ICD-10-CM | POA: Diagnosis not present

## 2020-10-07 DIAGNOSIS — M24559 Contracture, unspecified hip: Secondary | ICD-10-CM

## 2020-10-07 NOTE — Assessment & Plan Note (Signed)
Continues to have some mild tightness.  Likely some is secondary to sitting at work.  Patient states some mild discomfort from time to time but nothing severe.  No longer having significant radicular symptoms in either.  Responding well to manipulation follow-up again in 8 weeks

## 2020-10-07 NOTE — Patient Instructions (Signed)
Good to see you Doing great See me again in 2 months

## 2020-10-07 NOTE — Assessment & Plan Note (Signed)
Cervical radiculopathy is not occurring at this time.  He does have the gabapentin.  No change in medication or management.  Follow-up with me again 8 weeks.

## 2020-11-16 ENCOUNTER — Other Ambulatory Visit: Payer: Self-pay | Admitting: Family Medicine

## 2020-11-17 ENCOUNTER — Other Ambulatory Visit (HOSPITAL_COMMUNITY): Payer: Self-pay

## 2020-11-17 MED ORDER — GABAPENTIN 300 MG PO CAPS
ORAL_CAPSULE | Freq: Every day | ORAL | 1 refills | Status: DC
  Filled 2020-11-17: qty 30, 30d supply, fill #0
  Filled 2020-12-22: qty 30, 30d supply, fill #1

## 2020-11-18 ENCOUNTER — Other Ambulatory Visit (HOSPITAL_COMMUNITY): Payer: Self-pay

## 2020-11-18 MED FILL — Valacyclovir HCl Tab 1 GM: ORAL | 1 days supply | Qty: 4 | Fill #0 | Status: AC

## 2020-11-27 DIAGNOSIS — Z719 Counseling, unspecified: Secondary | ICD-10-CM

## 2020-12-09 ENCOUNTER — Other Ambulatory Visit: Payer: Self-pay

## 2020-12-09 ENCOUNTER — Ambulatory Visit (INDEPENDENT_AMBULATORY_CARE_PROVIDER_SITE_OTHER): Payer: 59 | Admitting: Family Medicine

## 2020-12-09 ENCOUNTER — Encounter: Payer: Self-pay | Admitting: Family Medicine

## 2020-12-09 VITALS — BP 122/88 | Ht 62.0 in | Wt 160.0 lb

## 2020-12-09 DIAGNOSIS — M9908 Segmental and somatic dysfunction of rib cage: Secondary | ICD-10-CM

## 2020-12-09 DIAGNOSIS — M9904 Segmental and somatic dysfunction of sacral region: Secondary | ICD-10-CM | POA: Diagnosis not present

## 2020-12-09 DIAGNOSIS — M9902 Segmental and somatic dysfunction of thoracic region: Secondary | ICD-10-CM | POA: Diagnosis not present

## 2020-12-09 DIAGNOSIS — M9901 Segmental and somatic dysfunction of cervical region: Secondary | ICD-10-CM | POA: Diagnosis not present

## 2020-12-09 DIAGNOSIS — M9903 Segmental and somatic dysfunction of lumbar region: Secondary | ICD-10-CM | POA: Diagnosis not present

## 2020-12-09 DIAGNOSIS — M5412 Radiculopathy, cervical region: Secondary | ICD-10-CM | POA: Diagnosis not present

## 2020-12-09 NOTE — Assessment & Plan Note (Signed)
Patient continues to have some mild tightness overall.  Nothing no severe.  Patient is not having any radicular symptoms at the moment.  Just felt tighter than usual.  Does have the gabapentin and has taken that fairly regularly.  Can always increase maybe if she starts feeling better but we will go back to intervals of 7 weeks.

## 2020-12-09 NOTE — Patient Instructions (Signed)
Good to see humpty dumpty  Stay together but see me again in 7 weeks

## 2020-12-09 NOTE — Progress Notes (Signed)
[place] Phone: [phone] Subjective:   [name], am serving as a scribe for Dr. Hulan Saas. This visit occurred during the SARS-CoV-2 public health emergency.  Safety protocols were in place, including screening questions prior to the visit, additional usage of staff PPE, and extensive cleaning of exam room while observing appropriate contact time as indicated for disinfecting solutions.   I'm seeing this patient by the request  of:  Janith Lima, MD  CC: Neck pain and low back pain follow-up  [id number]  Priscilla [name] is a 35 y.o. female coming in with complaint of back and neck pain. OMT 10/07/2020. Patient states that she is tight today. No issues since last visit otherwise.  Did not feel like she is done anything different just tighter than usual.  Continues on the gabapentin.  Has the meloxicam.  No radiation of pain down any of the extremities.  Medications patient has been prescribed: Gabapentin         Past Medical History:  Diagnosis Date  . Abnormal Pap smear   . Hx of varicella     Not on File   Review of Systems:  No headache, visual changes, nausea, vomiting, diarrhea, constipation, dizziness, abdominal pain, skin rash, fevers, chills, night sweats, weight loss, swollen lymph nodes,, joint swelling, chest pain, shortness of breath, mood changes. POSITIVE muscle aches, body aches  Objective  Blood pressure 122/88, height 5\' 2"  (1.575 m), weight 160 lb (72.6 kg).   General: No apparent distress alert and oriented x3 mood and affect normal, dressed appropriately.  HEENT: Pupils equal, extraocular movements intact  Respiratory: Patient's speak in full sentences and does not appear short of breath  Cardiovascular: No lower extremity edema, non tender, no erythema  Gait normal with good balance and coordination.  MSK:  Non tender with full range of motion and good stability and  symmetric strength and tone of shoulders, elbows, wrist, hip, knee and ankles bilaterally.  Back -low back exam does have some mild loss of lordosis noted.  Patient does have mild tightness around the thoracolumbar juncture.  Patient's neck exam still has tightness more on the right than the left.  5-5 strength on the upper extremities noted.  Mild limited sidebending to the right.  Osteopathic findings  C3 flexed rotated and side bent right T3 extended rotated and side bent right inhaled rib L1 flexed rotated and side bent right Sacrum right on right       Assessment and Plan:  Cervical radiculopathy at C8 Patient continues to have some mild tightness overall.  Nothing no severe.  Patient is not having any radicular symptoms at the moment.  Just felt tighter than usual.  Does have the gabapentin and has taken that fairly regularly.  Can always increase maybe if she starts feeling better but we will go back to intervals of 7 weeks.    Nonallopathic problems  Decision today to treat with OMT was based on Physical Exam  After verbal consent patient was treated with HVLA, ME, FPR techniques in cervical, rib, thoracic, lumbar, and sacral  areas  Patient tolerated the procedure well with improvement in symptoms  Patient given exercises, stretches and lifestyle modifications  See medications in patient instructions if given  Patient will follow up in 8 weeks      The above documentation has been reviewed and is accurate and complete Lyndal Pulley, DO  Note: This dictation was prepared with Dragon dictation along with smaller phrase technology. Any transcriptional errors that result from this process are unintentional.

## 2020-12-22 ENCOUNTER — Other Ambulatory Visit (HOSPITAL_COMMUNITY): Payer: Self-pay

## 2021-01-21 ENCOUNTER — Other Ambulatory Visit (HOSPITAL_COMMUNITY): Payer: Self-pay

## 2021-01-21 ENCOUNTER — Other Ambulatory Visit: Payer: Self-pay | Admitting: Family Medicine

## 2021-01-21 MED ORDER — GABAPENTIN 300 MG PO CAPS
ORAL_CAPSULE | Freq: Every day | ORAL | 1 refills | Status: DC
  Filled 2021-01-21: qty 30, 30d supply, fill #0
  Filled 2021-03-02: qty 30, 30d supply, fill #1

## 2021-01-25 ENCOUNTER — Encounter: Payer: Self-pay | Admitting: Family Medicine

## 2021-01-25 ENCOUNTER — Ambulatory Visit (INDEPENDENT_AMBULATORY_CARE_PROVIDER_SITE_OTHER): Payer: 59 | Admitting: Family Medicine

## 2021-01-25 ENCOUNTER — Other Ambulatory Visit: Payer: Self-pay

## 2021-01-25 VITALS — BP 100/82 | HR 74 | Ht 62.0 in | Wt 157.0 lb

## 2021-01-25 DIAGNOSIS — M9904 Segmental and somatic dysfunction of sacral region: Secondary | ICD-10-CM | POA: Diagnosis not present

## 2021-01-25 DIAGNOSIS — M9903 Segmental and somatic dysfunction of lumbar region: Secondary | ICD-10-CM | POA: Diagnosis not present

## 2021-01-25 DIAGNOSIS — M9902 Segmental and somatic dysfunction of thoracic region: Secondary | ICD-10-CM

## 2021-01-25 DIAGNOSIS — M9901 Segmental and somatic dysfunction of cervical region: Secondary | ICD-10-CM | POA: Diagnosis not present

## 2021-01-25 DIAGNOSIS — M533 Sacrococcygeal disorders, not elsewhere classified: Secondary | ICD-10-CM | POA: Diagnosis not present

## 2021-01-25 DIAGNOSIS — M9908 Segmental and somatic dysfunction of rib cage: Secondary | ICD-10-CM | POA: Diagnosis not present

## 2021-01-25 NOTE — Patient Instructions (Addendum)
Good to see you Enjoy the pool See me again in 6-8 weeks

## 2021-01-25 NOTE — Progress Notes (Signed)
  Priscilla [name] [address] [place] Phone: [phone] Subjective:   I Priscilla [name] am serving as a Education administrator for Dr. Hulan Saas.  This visit occurred during the SARS-CoV-2 public health emergency.  Safety protocols were in place, including screening questions prior to the visit, additional usage of staff PPE, and extensive cleaning of exam room while observing appropriate contact time as indicated for disinfecting solutions.   I'm seeing this patient by the request  of:  Janith Lima, MD  CC: Back pain and neck pain follow-up  [id number]  Priscilla [name] is a 35 y.o. female coming in with complaint of back and neck pain Patient states lower back and right hip is sore today.  Patient describes it more at the sacroiliac joint.  This is a dull, throbbing aching sensation.  Medications patient has been prescribed: Gabapentin, meloxicam         Past Medical History:  Diagnosis Date   Abnormal Pap smear    Hx of varicella     Not on File   Review of Systems:  No headache, visual changes, nausea, vomiting, diarrhea, constipation, dizziness, abdominal pain, skin rash, fevers, chills, night sweats, weight loss, swollen lymph nodes, body aches, joint swelling, chest pain, shortness of breath, mood changes. POSITIVE muscle aches  Objective  There were no vitals taken for this visit.   General: No apparent distress alert and oriented x3 mood and affect normal, dressed appropriately.  HEENT: Pupils equal, extraocular movements intact  Respiratory: Patient's speak in full sentences and does not appear short of breath  Cardiovascular: No lower extremity edema, non tender, no erythema  More discomfort around the right sacroiliac joint.  No significant pain in the neck itself today.  Mild loss of lordosis of the lumbar spine.  Still has some very mild weakness of the hip abductors bilaterally.  5 out of 5 strength though of the lower  extremities otherwise.  Osteopathic findings C5 flexed rotated and side bent right T7 extended rotated and side bent left with inhaled rib L2 flexed rotated and side bent right Sacrum right on right       Assessment and Plan: SI (sacroiliac) joint dysfunction Patient continues to have some sacroiliac dysfunction.  Discussed with patient about icing regimen and home exercises, discussed which activities to doing which wants to avoid.  Patient has responded fairly well to osteopathic manipulation but does seem to be chronic.  Does have the gabapentin as well as meloxicam.  Encourage patient to continue to stay active and continue to work on core strength.  Follow-up with me again 6 to 8 weeks.    Nonallopathic problems  Decision today to treat with OMT was based on Physical Exam  After verbal consent patient was treated with HVLA, ME, FPR techniques in  thoracic, lumbar, and sacral  areas  Patient tolerated the procedure well with improvement in symptoms  Patient given exercises, stretches and lifestyle modifications  See medications in patient instructions if given  Patient will follow up in 4-8 weeks      The above documentation has been reviewed and is accurate and complete Lyndal Pulley, DO       Note: This dictation was prepared with Dragon dictation along with smaller phrase technology. Any transcriptional errors that result from this process are unintentional.

## 2021-01-25 NOTE — Assessment & Plan Note (Signed)
Patient continues to have some sacroiliac dysfunction.  Discussed with patient about icing regimen and home exercises, discussed which activities to doing which wants to avoid.  Patient has responded fairly well to osteopathic manipulation but does seem to be chronic.  Does have the gabapentin as well as meloxicam.  Encourage patient to continue to stay active and continue to work on core strength.  Follow-up with me again 6 to 8 weeks.

## 2021-03-02 ENCOUNTER — Other Ambulatory Visit: Payer: Self-pay | Admitting: Family Medicine

## 2021-03-02 ENCOUNTER — Other Ambulatory Visit (HOSPITAL_COMMUNITY): Payer: Self-pay

## 2021-03-02 MED ORDER — MELOXICAM 15 MG PO TABS
15.0000 mg | ORAL_TABLET | Freq: Every day | ORAL | 0 refills | Status: DC
Start: 2021-03-02 — End: 2021-03-31
  Filled 2021-03-02: qty 30, 30d supply, fill #0

## 2021-03-10 ENCOUNTER — Ambulatory Visit: Payer: 59 | Admitting: Family Medicine

## 2021-03-24 ENCOUNTER — Encounter: Payer: Self-pay | Admitting: Family Medicine

## 2021-03-24 ENCOUNTER — Other Ambulatory Visit: Payer: Self-pay

## 2021-03-24 ENCOUNTER — Ambulatory Visit (INDEPENDENT_AMBULATORY_CARE_PROVIDER_SITE_OTHER): Payer: 59 | Admitting: Family Medicine

## 2021-03-24 VITALS — BP 104/76 | HR 89 | Ht 62.0 in

## 2021-03-24 DIAGNOSIS — M9908 Segmental and somatic dysfunction of rib cage: Secondary | ICD-10-CM

## 2021-03-24 DIAGNOSIS — M9902 Segmental and somatic dysfunction of thoracic region: Secondary | ICD-10-CM | POA: Diagnosis not present

## 2021-03-24 DIAGNOSIS — M9904 Segmental and somatic dysfunction of sacral region: Secondary | ICD-10-CM

## 2021-03-24 DIAGNOSIS — M9901 Segmental and somatic dysfunction of cervical region: Secondary | ICD-10-CM

## 2021-03-24 DIAGNOSIS — M5412 Radiculopathy, cervical region: Secondary | ICD-10-CM

## 2021-03-24 DIAGNOSIS — M9903 Segmental and somatic dysfunction of lumbar region: Secondary | ICD-10-CM | POA: Diagnosis not present

## 2021-03-24 NOTE — Progress Notes (Signed)
  [place] Phone: [phone] Subjective:   [name], am serving as a scribe for Dr. Hulan Saas.  This visit occurred during the SARS-CoV-2 public health emergency.  Safety protocols were in place, including screening questions prior to the visit, additional usage of staff PPE, and extensive cleaning of exam room while observing appropriate contact time as indicated for disinfecting solutions.   I'm seeing this patient by the request  of:  Janith Lima, MD  CC: Back and neck pain follow-up  [id number]  Priscilla [name] is a 35 y.o. female coming in with complaint of back and neck pain. OMT 01/25/2021. Patient states that her lower back pain increased but she has been doing relatively well.  Still has some tightness but nothing as severe as what she has sometimes.  Medications patient has been prescribed: Meloxicam, Gabapentin  Taking:         Past Medical History:  Diagnosis Date   Abnormal Pap smear    Hx of varicella      Review of Systems:  No headache, visual changes, nausea, vomiting, diarrhea, constipation, dizziness, abdominal pain, skin rash, fevers, chills, night sweats, weight loss, swollen lymph nodes, body aches, joint swelling, chest pain, shortness of breath, mood changes. POSITIVE muscle aches  Objective  Blood pressure 104/76, pulse 89, height '5\' 2"'$  (1.575 m), SpO2 98 %.   General: No apparent distress alert and oriented x3 mood and affect normal, dressed appropriately.  HEENT: Pupils equal, extraocular movements intact  Respiratory: Patient's speak in full sentences and does not appear short of breath  Cardiovascular: No lower extremity edema, non tender, no erythema  Neck exam does have some mild loss of lordosis.  Some tenderness to palpation of the paraspinal musculature.  No radicular symptoms with Spurling's. Mild tightness around the right sacroiliac joint.  Mild  positive FABER test.  Patient's left hip does have some mild anterior ilium rotation which is new for patient.  Osteopathic findings  C2 flexed rotated and side bent right C6 flexed rotated and side bent left T3 extended rotated and side bent right inhaled rib T9 extended rotated and side bent left L2 flexed rotated and side bent right Sacrum right on right       Assessment and Plan: Cervical radiculopathy at C8 Continues to respond fairly well to osteopathic manipulation.  Discussed icing regimen and home exercises.  Increase activity slowly.  Discussed avoiding certain activities.  Follow-up with me again in 2 months    Nonallopathic problems  Decision today to treat with OMT was based on Physical Exam  After verbal consent patient was treated with HVLA, ME, FPR techniques in cervical, rib, thoracic, lumbar, and sacral  areas  Patient tolerated the procedure well with improvement in symptoms  Patient given exercises, stretches and lifestyle modifications  See medications in patient instructions if given  Patient will follow up in 8 weeks      The above documentation has been reviewed and is accurate and complete Priscilla Pulley, DO        Note: This dictation was prepared with Dragon dictation along with smaller phrase technology. Any transcriptional errors that result from this process are unintentional.

## 2021-03-24 NOTE — Patient Instructions (Signed)
Good to see you  Get a basket ball name it wilson and pop your pelvis See me again in 2 months

## 2021-03-24 NOTE — Assessment & Plan Note (Signed)
Continues to respond fairly well to osteopathic manipulation.  Discussed icing regimen and home exercises.  Increase activity slowly.  Discussed avoiding certain activities.  Follow-up with me again in 2 months

## 2021-03-31 ENCOUNTER — Other Ambulatory Visit (HOSPITAL_COMMUNITY): Payer: Self-pay

## 2021-03-31 ENCOUNTER — Other Ambulatory Visit: Payer: Self-pay | Admitting: Family Medicine

## 2021-03-31 MED ORDER — MELOXICAM 15 MG PO TABS
15.0000 mg | ORAL_TABLET | Freq: Every day | ORAL | 0 refills | Status: DC
Start: 2021-03-31 — End: 2022-04-05
  Filled 2021-03-31 – 2022-01-24 (×2): qty 30, 30d supply, fill #0

## 2021-04-07 ENCOUNTER — Other Ambulatory Visit (HOSPITAL_COMMUNITY): Payer: Self-pay

## 2021-04-07 ENCOUNTER — Encounter: Payer: 59 | Admitting: Internal Medicine

## 2021-04-07 DIAGNOSIS — L570 Actinic keratosis: Secondary | ICD-10-CM | POA: Diagnosis not present

## 2021-04-07 MED ORDER — FLUOROURACIL 5 % EX CREA
TOPICAL_CREAM | CUTANEOUS | 0 refills | Status: AC
  Filled 2021-04-07: qty 40, 14d supply, fill #0

## 2021-04-08 ENCOUNTER — Other Ambulatory Visit (HOSPITAL_COMMUNITY): Payer: Self-pay

## 2021-04-09 ENCOUNTER — Other Ambulatory Visit: Payer: Self-pay | Admitting: Family Medicine

## 2021-04-09 ENCOUNTER — Other Ambulatory Visit (HOSPITAL_COMMUNITY): Payer: Self-pay

## 2021-04-09 MED ORDER — GABAPENTIN 300 MG PO CAPS
ORAL_CAPSULE | Freq: Every day | ORAL | 1 refills | Status: DC
  Filled 2021-04-09: qty 30, 30d supply, fill #0
  Filled 2021-05-11: qty 30, 30d supply, fill #1

## 2021-04-15 ENCOUNTER — Other Ambulatory Visit: Payer: Self-pay | Admitting: Internal Medicine

## 2021-04-19 ENCOUNTER — Ambulatory Visit (INDEPENDENT_AMBULATORY_CARE_PROVIDER_SITE_OTHER): Payer: 59 | Admitting: Internal Medicine

## 2021-04-19 ENCOUNTER — Encounter: Payer: Self-pay | Admitting: Internal Medicine

## 2021-04-19 ENCOUNTER — Other Ambulatory Visit: Payer: Self-pay

## 2021-04-19 VITALS — BP 118/68 | HR 69 | Temp 98.5°F | Ht 62.0 in | Wt 157.0 lb

## 2021-04-19 DIAGNOSIS — Z0000 Encounter for general adult medical examination without abnormal findings: Secondary | ICD-10-CM | POA: Diagnosis not present

## 2021-04-19 DIAGNOSIS — Z1159 Encounter for screening for other viral diseases: Secondary | ICD-10-CM | POA: Diagnosis not present

## 2021-04-19 DIAGNOSIS — R635 Abnormal weight gain: Secondary | ICD-10-CM | POA: Diagnosis not present

## 2021-04-19 DIAGNOSIS — Z23 Encounter for immunization: Secondary | ICD-10-CM

## 2021-04-19 LAB — TSH: TSH: 1.03 u[IU]/mL (ref 0.35–5.50)

## 2021-04-19 NOTE — Patient Instructions (Signed)
Health Maintenance, Female Adopting a healthy lifestyle and getting preventive care are important in promoting health and wellness. Ask your health care provider about: The right schedule for you to have regular tests and exams. Things you can do on your own to prevent diseases and keep yourself healthy. What should I know about diet, weight, and exercise? Eat a healthy diet  Eat a diet that includes plenty of vegetables, fruits, low-fat dairy products, and lean protein. Do not eat a lot of foods that are high in solid fats, added sugars, or sodium. Maintain a healthy weight Body mass index (BMI) is used to identify weight problems. It estimates body fat based on height and weight. Your health care provider can help determine your BMI and help you achieve or maintain a healthy weight. Get regular exercise Get regular exercise. This is one of the most important things you can do for your health. Most adults should: Exercise for at least 150 minutes each week. The exercise should increase your heart rate and make you sweat (moderate-intensity exercise). Do strengthening exercises at least twice a week. This is in addition to the moderate-intensity exercise. Spend less time sitting. Even light physical activity can be beneficial. Watch cholesterol and blood lipids Have your blood tested for lipids and cholesterol at 35 years of age, then have this test every 5 years. Have your cholesterol levels checked more often if: Your lipid or cholesterol levels are high. You are older than 35 years of age. You are at high risk for heart disease. What should I know about cancer screening? Depending on your health history and family history, you may need to have cancer screening at various ages. This may include screening for: Breast cancer. Cervical cancer. Colorectal cancer. Skin cancer. Lung cancer. What should I know about heart disease, diabetes, and high blood pressure? Blood pressure and heart  disease High blood pressure causes heart disease and increases the risk of stroke. This is more likely to develop in people who have high blood pressure readings, are of African descent, or are overweight. Have your blood pressure checked: Every 3-5 years if you are 18-39 years of age. Every year if you are 40 years old or older. Diabetes Have regular diabetes screenings. This checks your fasting blood sugar level. Have the screening done: Once every three years after age 40 if you are at a normal weight and have a low risk for diabetes. More often and at a younger age if you are overweight or have a high risk for diabetes. What should I know about preventing infection? Hepatitis B If you have a higher risk for hepatitis B, you should be screened for this virus. Talk with your health care provider to find out if you are at risk for hepatitis B infection. Hepatitis C Testing is recommended for: Everyone born from 1945 through 1965. Anyone with known risk factors for hepatitis C. Sexually transmitted infections (STIs) Get screened for STIs, including gonorrhea and chlamydia, if: You are sexually active and are younger than 35 years of age. You are older than 35 years of age and your health care provider tells you that you are at risk for this type of infection. Your sexual activity has changed since you were last screened, and you are at increased risk for chlamydia or gonorrhea. Ask your health care provider if you are at risk. Ask your health care provider about whether you are at high risk for HIV. Your health care provider may recommend a prescription medicine   to help prevent HIV infection. If you choose to take medicine to prevent HIV, you should first get tested for HIV. You should then be tested every 3 months for as long as you are taking the medicine. Pregnancy If you are about to stop having your period (premenopausal) and you may become pregnant, seek counseling before you get  pregnant. Take 400 to 800 micrograms (mcg) of folic acid every day if you become pregnant. Ask for birth control (contraception) if you want to prevent pregnancy. Osteoporosis and menopause Osteoporosis is a disease in which the bones lose minerals and strength with aging. This can result in bone fractures. If you are 65 years old or older, or if you are at risk for osteoporosis and fractures, ask your health care provider if you should: Be screened for bone loss. Take a calcium or vitamin D supplement to lower your risk of fractures. Be given hormone replacement therapy (HRT) to treat symptoms of menopause. Follow these instructions at home: Lifestyle Do not use any products that contain nicotine or tobacco, such as cigarettes, e-cigarettes, and chewing tobacco. If you need help quitting, ask your health care provider. Do not use street drugs. Do not share needles. Ask your health care provider for help if you need support or information about quitting drugs. Alcohol use Do not drink alcohol if: Your health care provider tells you not to drink. You are pregnant, may be pregnant, or are planning to become pregnant. If you drink alcohol: Limit how much you use to 0-1 drink a day. Limit intake if you are breastfeeding. Be aware of how much alcohol is in your drink. In the [place] one drink equals one 12 oz bottle of beer (355 mL), one 5 oz glass of wine (148 mL), or one 1 oz glass of hard liquor (44 mL). General instructions Schedule regular health, dental, and eye exams. Stay current with your vaccines. Tell your health care provider if: You often feel depressed. You have ever been abused or do not feel safe at home. Summary Adopting a healthy lifestyle and getting preventive care are important in promoting health and wellness. Follow your health care provider's instructions about healthy diet, exercising, and getting tested or screened for diseases. Follow your health care provider's  instructions on monitoring your cholesterol and blood pressure. This information is not intended to replace advice given to you by your health care provider. Make sure you discuss any questions you have with your health care provider. Document Revised: 09/25/2020 Document Reviewed: 07/11/2018 Elsevier Patient Education  2022 [employer] Inc.  

## 2021-04-19 NOTE — Progress Notes (Signed)
Subjective:  Patient ID: Priscilla [name], female    DOB: 1986/03/29  Age: 35 y.o. MRN: [id number]  CC: Annual Exam  This visit occurred during the SARS-CoV-2 public health emergency.  Safety protocols were in place, including screening questions prior to the visit, additional usage of staff PPE, and extensive cleaning of exam room while observing appropriate contact time as indicated for disinfecting solutions.    HPI [name] presents for a CPX.  She complains of an inability to lose weight but otherwise she feels well.  Outpatient Medications Prior to Visit  Medication Sig Dispense Refill   fluorouracil (EFUDEX) 5 % cream Apply to the spots on the skin once daily for 2 weeks as directed 40 g 0   gabapentin (NEURONTIN) 300 MG capsule TAKE 1 CAPSULE BY MOUTH AT BEDTIME 30 capsule 1   levonorgestrel (MIRENA) 20 MCG/24HR IUD 1 each by Intrauterine route once.     meloxicam (MOBIC) 15 MG tablet Take 1 tablet (15 mg total) by mouth daily. 30 tablet 0   No facility-administered medications prior to visit.    ROS Review of Systems  Constitutional:  Negative for fatigue and unexpected weight change.  HENT: Negative.    Respiratory: Negative.    Cardiovascular: Negative.   Gastrointestinal: Negative.   Endocrine: Negative.   Genitourinary: Negative.   Musculoskeletal: Negative.  Negative for arthralgias.  Skin: Negative.   Neurological:  Negative for dizziness, weakness and headaches.  Hematological:  Negative for adenopathy. Does not bruise/bleed easily.  Psychiatric/Behavioral: Negative.     Objective:  BP 118/68 (BP Location: Left Arm, Patient Position: Sitting, Cuff Size: Large)   Pulse 69   Temp 98.5 F (36.9 C) (Oral)   Ht '5\' 2"'$  (1.575 m)   Wt 157 lb (71.2 kg)   SpO2 99%   BMI 28.72 kg/m   BP Readings from Last 3 Encounters:  04/19/21 118/68  03/24/21 104/76  01/25/21 100/82    Wt Readings from Last 3 Encounters:  04/19/21 157 lb (71.2 kg)  01/25/21 157 lb  (71.2 kg)  12/09/20 160 lb (72.6 kg)    Physical Exam Vitals reviewed.  HENT:     Nose: Nose normal.     Mouth/Throat:     Mouth: Mucous membranes are moist.  Eyes:     Conjunctiva/sclera: Conjunctivae normal.  Cardiovascular:     Rate and Rhythm: Normal rate and regular rhythm.     Heart sounds: No murmur heard. Pulmonary:     Effort: Pulmonary effort is normal.     Breath sounds: No stridor. No wheezing, rhonchi or rales.  Abdominal:     General: Abdomen is flat. Bowel sounds are normal. There is no distension.     Palpations: Abdomen is soft. There is no hepatomegaly, splenomegaly or mass.     Tenderness: There is no abdominal tenderness.  Musculoskeletal:        General: Normal range of motion.     Cervical back: Neck supple.     Right lower leg: No edema.     Left lower leg: No edema.  Lymphadenopathy:     Cervical: No cervical adenopathy.  Skin:    General: Skin is warm and dry.  Neurological:     General: No focal deficit present.     Mental Status: She is alert.  Psychiatric:        Mood and Affect: Mood normal.        Behavior: Behavior normal.    Lab Results  Component Value Date   WBC 8.7 04/02/2020   HGB 14.2 04/02/2020   HCT 42.1 04/02/2020   PLT 311 04/02/2020   GLUCOSE 91 04/02/2020   CHOL 188 11/15/2017   TRIG 96.0 11/15/2017   HDL 81.60 11/15/2017   LDLCALC 87 11/15/2017   ALT 19 10/13/2015   AST 20 10/13/2015   NA 139 04/02/2020   K 4.9 04/02/2020   CL 105 04/02/2020   CREATININE 0.83 04/02/2020   BUN 15 04/02/2020   CO2 26 04/02/2020   TSH 1.03 04/19/2021   PSA GYN 03/20/2018   HGBA1C 5.4 11/15/2017    MR Cervical Spine Wo Contrast  Result Date: 07/11/2018 CLINICAL DATA:  Cervicalgia EXAM: MRI CERVICAL SPINE WITHOUT CONTRAST TECHNIQUE: Multiplanar, multisequence MR imaging of the cervical spine was performed. No intravenous contrast was administered. COMPARISON:  None. FINDINGS: Alignment: Normal Vertebrae: Normal bone marrow.   Negative for fracture or mass. Cord: Normal signal and morphology. Posterior Fossa, vertebral arteries, paraspinal tissues: Negative. Disc levels: C2-3: Negative C3-4: Negative C4-5: Negative C5-6: Small to moderate right paracentral disc protrusion. 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 Neural foramina patent bilaterally. C6-7: Negative C7-T1: Negative IMPRESSION: Small to moderate right paracentral disc protrusion at C5-6 with mild cord flattening. Otherwise negative Electronically Signed   By: Franchot Gallo M.D.   On: 07/11/2018 09:49    Assessment & Plan:   [name] was seen today for annual exam.  Diagnoses and all orders for this visit:  Need for hepatitis C screening test -     Hepatitis C antibody; Future -     Hepatitis C antibody  Weight gain, abnormal- Labs are neg for secondary causes. -     TSH; Future -     TSH  Routine general medical examination at a health care facility- Exam completed, labs reviewed, vaccines reviewed and updated, cancer screenings are up-to-date, patient education was given.  Other orders -     Flu Vaccine QUAD 6+ mos PF IM (Fluarix Quad PF)  I am having [name] maintain her levonorgestrel, meloxicam, fluorouracil, and gabapentin.  No orders of the defined types were placed in this encounter.    Follow-up: Return in about 1 year (around 04/19/2022).  [name], MD

## 2021-04-20 LAB — HEPATITIS C ANTIBODY
Hepatitis C Ab: NONREACTIVE
SIGNAL TO CUT-OFF: 0.01 (ref <1.00)

## 2021-05-11 ENCOUNTER — Other Ambulatory Visit (HOSPITAL_COMMUNITY): Payer: Self-pay

## 2021-05-19 DIAGNOSIS — Z01419 Encounter for gynecological examination (general) (routine) without abnormal findings: Secondary | ICD-10-CM | POA: Diagnosis not present

## 2021-05-19 DIAGNOSIS — Z6827 Body mass index (BMI) 27.0-27.9, adult: Secondary | ICD-10-CM | POA: Diagnosis not present

## 2021-05-21 NOTE — Progress Notes (Signed)
   Benito Mccreedy D.[place] Phone: [phone]   Assessment and Plan:     1. Neck pain 2. Somatic dysfunction of cervical region 3. Somatic dysfunction of thoracic region 4. Somatic dysfunction of lumbar region 5. Somatic dysfunction of pelvic region 6. Somatic dysfunction of rib region -Chronic with exacerbation, subsequent visit - Patient returns for repeat OMT visit.  She states that she has had similar neck tightness as well as right hip discomfort.  Elects for repeat OMT.  Tolerated well per procedure note below   Decision today to treat with OMT was based on Physical Exam   After verbal consent patient was treated with HVLA (high velocity low amplitude), ME (muscle energy), FPR (flex positional release), ST (soft tissue), PC/PD (Pelvic Compression/ Pelvic Decompression) techniques in cervical, rib, thoracic, lumbar, and pelvic areas. Patient tolerated the procedure well with improvement in symptoms.  Patient educated on potential side effects of soreness and recommended to rest, hydrate, and use Tylenol as needed for pain control.   Pertinent previous records reviewed include none   Follow Up: In 6-8 weeks for repeat OMT   Subjective:   I, Priscilla [name], am serving as a Education administrator for Dr. Glennon Mac.  Chief Complaint: OMT   HPI:   05/24/21 Patient is a 35 year old female presenting with back and neck pain. Patient was last seen by Dr. Tamala Julian on 03/24/21 and had OMT. Today patient states pain is worse than last time. Same location in hip and neck pain. No new complaints.  Relevant Historical Information: History of C8 radiculopathy  Additional pertinent review of systems negative.  Current Outpatient Medications  Medication Sig Dispense Refill   fluorouracil (EFUDEX) 5 % cream Apply to the spots on the skin once daily for 2 weeks as directed 40 g 0   gabapentin (NEURONTIN) 300 MG capsule TAKE 1 CAPSULE BY MOUTH  AT BEDTIME 30 capsule 1   levonorgestrel (MIRENA) 20 MCG/24HR IUD 1 each by Intrauterine route once.     meloxicam (MOBIC) 15 MG tablet Take 1 tablet (15 mg total) by mouth daily. 30 tablet 0   No current facility-administered medications for this visit.      Objective:     Vitals:   05/24/21 1545  BP: 124/82  Pulse: 89  SpO2: 98%  Weight: 158 lb (71.7 kg)  Height: 5\' 2"  (1.575 m)      Body mass index is 28.9 kg/m.    Physical Exam:     General: Well-appearing, cooperative, sitting comfortably in no acute distress.   OMT Physical Exam:  ASIS Compression Test: Positive Right Cervical: Mild TTP paraspinal, C3 RLSL Rib: Bilateral elevated first rib with NTTP Thoracic: nTTP paraspinal, T6-8 RLSR Lumbar: nTTP paraspinal, L1-3 RRSL Pelvis: Right anterior innominate  Electronically signed by:  Benito Mccreedy D.[facility] 4:40 PM 05/24/21

## 2021-05-24 ENCOUNTER — Ambulatory Visit: Payer: 59 | Admitting: Family Medicine

## 2021-05-24 ENCOUNTER — Ambulatory Visit (INDEPENDENT_AMBULATORY_CARE_PROVIDER_SITE_OTHER): Payer: 59 | Admitting: Sports Medicine

## 2021-05-24 ENCOUNTER — Other Ambulatory Visit: Payer: Self-pay

## 2021-05-24 VITALS — BP 124/82 | HR 89 | Ht 62.0 in | Wt 158.0 lb

## 2021-05-24 DIAGNOSIS — M9901 Segmental and somatic dysfunction of cervical region: Secondary | ICD-10-CM | POA: Diagnosis not present

## 2021-05-24 DIAGNOSIS — M9903 Segmental and somatic dysfunction of lumbar region: Secondary | ICD-10-CM | POA: Diagnosis not present

## 2021-05-24 DIAGNOSIS — M9902 Segmental and somatic dysfunction of thoracic region: Secondary | ICD-10-CM

## 2021-05-24 DIAGNOSIS — M542 Cervicalgia: Secondary | ICD-10-CM

## 2021-05-24 DIAGNOSIS — M9905 Segmental and somatic dysfunction of pelvic region: Secondary | ICD-10-CM

## 2021-05-24 DIAGNOSIS — M9908 Segmental and somatic dysfunction of rib cage: Secondary | ICD-10-CM

## 2021-05-24 NOTE — Patient Instructions (Signed)
See you again in 6-8 weeks

## 2021-06-08 ENCOUNTER — Other Ambulatory Visit: Payer: Self-pay | Admitting: Internal Medicine

## 2021-06-08 ENCOUNTER — Other Ambulatory Visit (HOSPITAL_COMMUNITY): Payer: Self-pay

## 2021-06-08 DIAGNOSIS — B001 Herpesviral vesicular dermatitis: Secondary | ICD-10-CM

## 2021-06-08 MED ORDER — VALACYCLOVIR HCL 1 G PO TABS
ORAL_TABLET | ORAL | 1 refills | Status: DC
  Filled 2021-06-08: qty 4, 1d supply, fill #0
  Filled 2021-07-13: qty 4, 1d supply, fill #1

## 2021-06-11 ENCOUNTER — Other Ambulatory Visit (HOSPITAL_COMMUNITY): Payer: Self-pay

## 2021-06-11 ENCOUNTER — Other Ambulatory Visit: Payer: Self-pay | Admitting: Family Medicine

## 2021-06-11 MED ORDER — GABAPENTIN 300 MG PO CAPS
ORAL_CAPSULE | Freq: Every day | ORAL | 1 refills | Status: DC
  Filled 2021-06-11: qty 30, 30d supply, fill #0
  Filled 2021-07-13: qty 30, 30d supply, fill #1

## 2021-06-16 DIAGNOSIS — Z0100 Encounter for examination of eyes and vision without abnormal findings: Secondary | ICD-10-CM | POA: Diagnosis not present

## 2021-07-06 NOTE — Progress Notes (Signed)
[place] Phone: [phone] Subjective:   [name], am serving as a scribe for Dr. Hulan Saas.  This visit occurred during the SARS-CoV-2 public health emergency.  Safety protocols were in place, including screening questions prior to the visit, additional usage of staff PPE, and extensive cleaning of exam room while observing appropriate contact time as indicated for disinfecting solutions.   I'm seeing this patient by the request  of:  Janith Lima, MD  CC: Neck and back pain  [id number]  Priscilla [name] is a 35 y.o. female coming in with complaint of back and neck pain. OMT with Dr. Glennon Mac on 05/24/2021. Patient states that her R hip feels out of place. Lumbar spine is tight.   Medications patient has been prescribed: Gabapentin  Taking:         Reviewed prior external information including notes and imaging from previsou exam, outside providers and external EMR if available.   As well as notes that were available from care everywhere and other healthcare systems.  Past medical history, social, surgical and family history all reviewed in electronic medical record.  No pertanent information unless stated regarding to the chief complaint.   Past Medical History:  Diagnosis Date   Abnormal Pap smear    Hx of varicella     Not on File   Review of Systems:  No headache, visual changes, nausea, vomiting, diarrhea, constipation, dizziness, abdominal pain, skin rash, fevers, chills, night sweats, weight loss, swollen lymph nodes, body aches, joint swelling, chest pain, shortness of breath, mood changes. POSITIVE muscle aches  Objective  Blood pressure (!) 132/92, pulse 88, height 5\' 2"  (1.575 m), weight 156 lb (70.8 kg), SpO2 98 %.   General: No apparent distress alert and oriented x3 mood and affect normal, dressed appropriately.  HEENT: Pupils equal, extraocular movements intact   Respiratory: Patient's speak in full sentences and does not appear short of breath  Cardiovascular: No lower extremity edema, non tender, no erythema  Low back exam shows the patient is some tightness noted in the paraspinal musculature of the lumbar spine.  Patient does have tightness with FABER test.  No anterior rotation of the right hip noted as well.  Osteopathic findings  C2 flexed rotated and side bent right C6 flexed rotated and side bent left T3 extended rotated and side bent right inhaled rib T9 extended rotated and side bent left L1 flexed rotated and side bent right Sacrum right on right       Assessment and Plan:  SI (sacroiliac) joint dysfunction Increased tightness is noted.  Responded extremely well though to osteopathic manipulation.  Does have gabapentin that has helped her neck as well as meloxicam previously for her back.  Discussed icing regimen and home exercises.  Increase activity slowly.  Follow-up again in 4 to 8 weeks   Nonallopathic problems  Decision today to treat with OMT was based on Physical Exam  After verbal consent patient was treated with HVLA, ME, FPR techniques in cervical, rib, thoracic, lumbar, and sacral  areas  Patient tolerated the procedure well with improvement in symptoms  Patient given exercises, stretches and lifestyle modifications  See medications in patient instructions if given  Patient will follow up in 4-8 weeks      The above documentation has been reviewed and is accurate and complete Lyndal Pulley, DO        Note: This dictation was prepared  with Dragon dictation along with smaller phrase technology. Any transcriptional errors that result from this process are unintentional.

## 2021-07-07 ENCOUNTER — Ambulatory Visit (INDEPENDENT_AMBULATORY_CARE_PROVIDER_SITE_OTHER): Payer: 59 | Admitting: Family Medicine

## 2021-07-07 ENCOUNTER — Other Ambulatory Visit: Payer: Self-pay

## 2021-07-07 ENCOUNTER — Encounter: Payer: Self-pay | Admitting: Family Medicine

## 2021-07-07 VITALS — BP 132/92 | HR 88 | Ht 62.0 in | Wt 156.0 lb

## 2021-07-07 DIAGNOSIS — M9908 Segmental and somatic dysfunction of rib cage: Secondary | ICD-10-CM

## 2021-07-07 DIAGNOSIS — M9902 Segmental and somatic dysfunction of thoracic region: Secondary | ICD-10-CM

## 2021-07-07 DIAGNOSIS — M9901 Segmental and somatic dysfunction of cervical region: Secondary | ICD-10-CM

## 2021-07-07 DIAGNOSIS — M9903 Segmental and somatic dysfunction of lumbar region: Secondary | ICD-10-CM

## 2021-07-07 DIAGNOSIS — M533 Sacrococcygeal disorders, not elsewhere classified: Secondary | ICD-10-CM

## 2021-07-07 DIAGNOSIS — M9904 Segmental and somatic dysfunction of sacral region: Secondary | ICD-10-CM | POA: Diagnosis not present

## 2021-07-07 NOTE — Patient Instructions (Signed)
Good to see you Good luck with [facility] See me in 6-8 weeks

## 2021-07-07 NOTE — Assessment & Plan Note (Signed)
Increased tightness is noted.  Responded extremely well though to osteopathic manipulation.  Does have gabapentin that has helped her neck as well as meloxicam previously for her back.  Discussed icing regimen and home exercises.  Increase activity slowly.  Follow-up again in 4 to 8 weeks

## 2021-07-13 ENCOUNTER — Other Ambulatory Visit (HOSPITAL_COMMUNITY): Payer: Self-pay

## 2021-07-20 ENCOUNTER — Telehealth: Payer: 59 | Admitting: Physician Assistant

## 2021-07-20 ENCOUNTER — Other Ambulatory Visit (HOSPITAL_COMMUNITY): Payer: Self-pay

## 2021-07-20 DIAGNOSIS — B3731 Acute candidiasis of vulva and vagina: Secondary | ICD-10-CM

## 2021-07-20 MED ORDER — FLUCONAZOLE 150 MG PO TABS
150.0000 mg | ORAL_TABLET | Freq: Once | ORAL | 0 refills | Status: AC
Start: 2021-07-20 — End: 2021-07-21
  Filled 2021-07-20: qty 1, 1d supply, fill #0

## 2021-07-20 NOTE — Progress Notes (Signed)

## 2021-08-12 ENCOUNTER — Ambulatory Visit
Admission: RE | Admit: 2021-08-12 | Discharge: 2021-08-12 | Disposition: A | Discharge status: Home / Self Care | Payer: 59 | Source: Ambulatory Visit | Attending: Family Medicine | Admitting: Family Medicine

## 2021-08-12 ENCOUNTER — Encounter: Payer: Self-pay | Admitting: Family Medicine

## 2021-08-12 ENCOUNTER — Ambulatory Visit: Payer: 59 | Admitting: Family Medicine

## 2021-08-12 ENCOUNTER — Other Ambulatory Visit (HOSPITAL_COMMUNITY): Payer: Self-pay

## 2021-08-12 ENCOUNTER — Ambulatory Visit (INDEPENDENT_AMBULATORY_CARE_PROVIDER_SITE_OTHER): Payer: 59 | Admitting: Family Medicine

## 2021-08-12 VITALS — BP 132/78 | HR 75 | Temp 98.2°F | Resp 16 | Ht 62.0 in | Wt 157.0 lb

## 2021-08-12 DIAGNOSIS — R0789 Other chest pain: Secondary | ICD-10-CM | POA: Diagnosis not present

## 2021-08-12 DIAGNOSIS — R0602 Shortness of breath: Secondary | ICD-10-CM | POA: Diagnosis not present

## 2021-08-12 LAB — COMPREHENSIVE METABOLIC PANEL
ALT: 14 U/L (ref 0–35)
AST: 20 U/L (ref 0–37)
Albumin: 4.6 g/dL (ref 3.5–5.2)
Alkaline Phosphatase: 45 U/L (ref 39–117)
BUN: 13 mg/dL (ref 6–23)
CO2: 27 mEq/L (ref 19–32)
Calcium: 9.8 mg/dL (ref 8.4–10.5)
Chloride: 103 mEq/L (ref 96–112)
Creatinine, Ser: 0.8 mg/dL (ref 0.40–1.20)
GFR: 95.32 mL/min (ref >60.00)
Glucose, Bld: 92 mg/dL (ref 70–99)
Potassium: 3.7 mEq/L (ref 3.5–5.1)
Sodium: 138 mEq/L (ref 135–145)
Total Bilirubin: 0.6 mg/dL (ref 0.2–1.2)
Total Protein: 7.3 g/dL (ref 6.0–8.3)

## 2021-08-12 LAB — CBC
HCT: 41.3 % (ref 36.0–46.0)
Hemoglobin: 13.7 g/dL (ref 12.0–15.0)
MCHC: 33.2 g/dL (ref 30.0–36.0)
MCV: 95.4 fl (ref 78.0–100.0)
Platelets: 301 10*3/uL (ref 150.0–400.0)
RBC: 4.33 Mil/uL (ref 3.87–5.11)
RDW: 13.4 % (ref 11.5–15.5)
WBC: 8.9 10*3/uL (ref 4.0–10.5)

## 2021-08-12 LAB — TSH: TSH: 1.47 u[IU]/mL (ref 0.35–5.50)

## 2021-08-12 LAB — D-DIMER, QUANTITATIVE: D-Dimer, Quant: 0.29 mcg/mL FEU (ref <0.50)

## 2021-08-12 LAB — TROPONIN I (HIGH SENSITIVITY): High Sens Troponin I: 3 ng/L (ref 2–17)

## 2021-08-12 MED ORDER — ALPRAZOLAM 0.25 MG PO TABS
0.2500 mg | ORAL_TABLET | Freq: Two times a day (BID) | ORAL | 0 refills | Status: DC | PRN
  Filled 2021-08-12: qty 10, 5d supply, fill #0

## 2021-08-12 MED ORDER — PANTOPRAZOLE SODIUM 40 MG PO TBEC
40.0000 mg | DELAYED_RELEASE_TABLET | Freq: Every day | ORAL | 3 refills | Status: DC
  Filled 2021-08-12: qty 30, 30d supply, fill #0

## 2021-08-12 NOTE — Progress Notes (Addendum)
[place] at [facility] [address] [place], [place] [address] [phone]  Date:  08/12/2021   Name:  Priscilla [name]   DOB:  [birth year]   MRN:  [id number]  PCP:  Janith Lima, MD    Chief Complaint: BP concerns   History of Present Illness:  Priscilla [name] is a 36 y.o. very pleasant female patient who presents with the following:  Generally healthy young woman here today with concern of shortness of breath/chest discomfort Today is Thursday.  She has had 2 episodes of chest discomfort and shortness of breath-1 occurred last Friday and 1 this past Tuesday. During both episodes she was doing her normal job as a Engineer, building services.  Not associated with physical activity  Patient describes that she felt flushed, and then like her chest was heavy and it was difficult to breathe.  She drank water and rested and symptoms resolved after about 30 minutes. Never had this in the past.  She does not generally get panic attacks She did not notice any symptoms of allergic reaction such as itching or swelling No major changes in her life or illness recently  No history of DVT or PE No particular family history of heart issues  No fever or cough  She has not noted any symptoms of reflux Her BP had been elevated when she had her other attack at work as the nursing staff checked her blood pressure- she is not sure of the exact number however  Right now she feels quite normal.  She went to the gym this morning and walked on the treadmill She notes that yesterday she had a period of palpitations but this resolved  BP Readings from Last 3 Encounters:  08/12/21 132/78  07/07/21 (!) 132/92  05/24/21 124/82     Patient Active Problem List   Diagnosis Date Noted   Need for hepatitis C screening test 04/19/2021   SI (sacroiliac) joint dysfunction 01/25/2021   Weight gain, abnormal 04/02/2020   Routine general medical examination at a  health care facility 11/29/2018   Cervical radiculopathy at C8 06/20/2018   Herpes labialis without complication 62/94/7654    Past Medical History:  Diagnosis Date   Abnormal Pap smear    Hx of varicella     Past Surgical History:  Procedure Laterality Date   CESAREAN SECTION N/A 12/22/2012   Procedure: PRIMARY CESAREAN SECTION;  Surgeon: Darlyn Chamber, MD;  Location: [facility];  Service: Obstetrics;  Laterality: N/A;   COLPOSCOPY      Social History   Tobacco Use   Smoking status: Former    Types: Cigarettes    Quit date: 06/01/2008    Years since quitting: 13.2   Smokeless tobacco: Never  Substance Use Topics   Alcohol use: Yes    Alcohol/week: 1.0 standard drink    Types: 1 Glasses of wine per week   Drug use: No    Family History  Problem Relation Age of Onset   Diabetes Mother    Hyperlipidemia Father    Hypertension Father    Diabetes Father    Cancer Maternal Grandmother        leaukemia   Stroke Neg Hx    Early death Neg Hx    Kidney disease Neg Hx     Not on File  Medication list has been reviewed and updated.  Current Outpatient Medications on File Prior to Visit  Medication Sig Dispense  Refill   fluorouracil (EFUDEX) 5 % cream Apply to the spots on the skin once daily for 2 weeks as directed 40 g 0   gabapentin (NEURONTIN) 300 MG capsule TAKE 1 CAPSULE BY MOUTH AT BEDTIME 30 capsule 1   levonorgestrel (MIRENA) 20 MCG/24HR IUD 1 each by Intrauterine route once.     meloxicam (MOBIC) 15 MG tablet Take 1 tablet (15 mg total) by mouth daily. 30 tablet 0   valACYclovir (VALTREX) 1000 MG tablet TAKE 2 TABLETS BY MOUTH TWO TIMES DAILY FOR 1 DAY 4 tablet 1   No current facility-administered medications on file prior to visit.    Review of Systems:  As per HPI- otherwise negative.   Physical Examination: Vitals:   08/12/21 1106  BP: 132/78  Pulse: 75  Resp: 16  Temp: 98.2 F (36.8 C)  SpO2: 98%   Vitals:   08/12/21 1106  Weight: 157 lb (71.2  kg)  Height: 5\' 2"  (1.575 m)   Body mass index is 28.72 kg/m. Ideal Body Weight: Weight in (lb) to have BMI = 25: 136.4  GEN: no acute distress.  Mild overweight, looks well HEENT: Atraumatic, Normocephalic.  Bilateral TM wnl, oropharynx normal.  PEERL,EOMI.   Ears and Nose: No external deformity. CV: RRR, No M/G/R. No JVD. No thrill. No extra heart sounds. PULM: CTA B, no wheezes, crackles, rhonchi. No retractions. No resp. distress. No accessory muscle use. ABD: S, NT, ND, +BS. No rebound. No HSM. EXTR: No c/c/e PSYCH: Normally interactive. Conversant.   EKG: NSR , no old tracing for comparison Rate 86  Assessment and Plan: Chest tightness - Plan: EKG 12-Lead, CBC, Comprehensive metabolic panel, TSH, D-Dimer, Quantitative, Troponin I (High Sensitivity), DG Chest 2 View, ALPRAZolam (XANAX) 0.25 MG tablet, pantoprazole (PROTONIX) 40 MG tablet  SOB (shortness of breath) - Plan: CBC, Comprehensive metabolic panel, TSH, D-Dimer, Quantitative, Troponin I (High Sensitivity), DG Chest 2 View  Patient seen today with concern of 2 recent episodes of chest tightness and shortness of breath.  Currently feeling normal.  We discussed the differential diagnosis together.  We will do lab work and a chest x-ray, D-dimer to help risk stratify as far as a pulmonary embolism.  We will do a troponin but this should be negative Assuming all this work-up is normal, consider esophageal spasm or panic attack Provided prescription for PPI and alprazolam to try assuming labs are normal as above.  Advised that alprazolam can cause sedation  Signed Lamar Blinks, MD  Received labs and x-ray- message to pt  Results for orders placed or performed in visit on 08/12/21  CBC  Result Value Ref Range   WBC 8.9 4.0 - 10.5 K/uL   RBC 4.33 3.87 - 5.11 Mil/uL   Platelets 301.0 150.0 - 400.0 K/uL   Hemoglobin 13.7 12.0 - 15.0 g/dL   HCT 41.3 36.0 - 46.0 %   MCV 95.4 78.0 - 100.0 fl   MCHC 33.2 30.0 - 36.0 g/dL    RDW 13.4 11.5 - 15.5 %  Comprehensive metabolic panel  Result Value Ref Range   Sodium 138 135 - 145 mEq/L   Potassium 3.7 3.5 - 5.1 mEq/L   Chloride 103 96 - 112 mEq/L   CO2 27 19 - 32 mEq/L   Glucose, Bld 92 70 - 99 mg/dL   BUN 13 6 - 23 mg/dL   Creatinine, Ser 0.80 0.40 - 1.20 mg/dL   Total Bilirubin 0.6 0.2 - 1.2 mg/dL   Alkaline Phosphatase 45 39 -  117 U/L   AST 20 0 - 37 U/L   ALT 14 0 - 35 U/L   Total Protein 7.3 6.0 - 8.3 g/dL   Albumin 4.6 3.5 - 5.2 g/dL   GFR 95.32 >60.00 mL/min   Calcium 9.8 8.4 - 10.5 mg/dL  TSH  Result Value Ref Range   TSH 1.47 0.35 - 5.50 uIU/mL  D-Dimer, Quantitative  Result Value Ref Range   D-Dimer, Quant 0.29 <0.50 mcg/mL FEU  Troponin I (High Sensitivity)  Result Value Ref Range   High Sens Troponin I 3 2 - 17 ng/L    DG Chest 2 View  Result Date: 08/12/2021 CLINICAL DATA:  [name], now resolved EXAM: CHEST - 2 VIEW COMPARISON:  None. FINDINGS: Cardiac and mediastinal contours are within normal limits. No focal pulmonary opacity. No pleural effusion or pneumothorax. No acute osseous abnormality. IMPRESSION: No acute cardiopulmonary process. Electronically Signed   By: Merilyn Baba M.D.   On: 08/12/2021 12:52

## 2021-08-12 NOTE — Patient Instructions (Addendum)
Good to see you!  I will be in touch with your labs and chest x-ray- do at [facility]  Let me know if anything is changing or getting worse Try the pantoprazole for 2-4 weeks in case this is reflux  You might try the xanax if this happens again and see if it helps

## 2021-08-17 ENCOUNTER — Other Ambulatory Visit: Payer: Self-pay | Admitting: Family Medicine

## 2021-08-17 ENCOUNTER — Other Ambulatory Visit (HOSPITAL_COMMUNITY): Payer: Self-pay

## 2021-08-17 ENCOUNTER — Encounter: Payer: Self-pay | Admitting: Internal Medicine

## 2021-08-17 MED ORDER — GABAPENTIN 300 MG PO CAPS
ORAL_CAPSULE | Freq: Every day | ORAL | 1 refills | Status: DC
  Filled 2021-08-17: qty 30, 30d supply, fill #0
  Filled 2021-09-14: qty 30, 30d supply, fill #1

## 2021-08-18 NOTE — Progress Notes (Deleted)
°  [place] Phone: [phone] Subjective:    I'm seeing this patient by the request  of:  Janith Lima, MD  CC: Neck and back pain follow-up  [id number]  Priscilla [name] is a 36 y.o. female coming in with complaint of back and neck pain. OMT on 07/07/2021. Patient states   Medications patient has been prescribed: Gabapentin  Taking: Yes         Reviewed prior external information including notes and imaging from previsou exam, outside providers and external EMR if available.   As well as notes that were available from care everywhere and other healthcare systems.  Past medical history, social, surgical and family history all reviewed in electronic medical record.  No pertanent information unless stated regarding to the chief complaint.   Past Medical History:  Diagnosis Date   Abnormal Pap smear    Hx of varicella     Not on File   Review of Systems:  No headache, visual changes, nausea, vomiting, diarrhea, constipation, dizziness, abdominal pain, skin rash, fevers, chills, night sweats, weight loss, swollen lymph nodes, body aches, joint swelling, chest pain, shortness of breath, mood changes. POSITIVE muscle aches  Objective  There were no vitals taken for this visit.   General: No apparent distress alert and oriented x3 mood and affect normal, dressed appropriately.  HEENT: Pupils equal, extraocular movements intact  Respiratory: Patient's speak in full sentences and does not appear short of breath  Cardiovascular: No lower extremity edema, non tender, no erythema  Neck exam does have some loss of lordosis.  Some tenderness to palpation in the paraspinal musculature of the parascapular region.  Patient does show some tightness on the sacroiliac joint as well.  Osteopathic findings  C2 flexed rotated and side bent right C6 flexed rotated and side bent left T3 extended rotated and side  bent right inhaled rib T9 extended rotated and side bent left L2 flexed rotated and side bent right Sacrum right on right       Assessment and Plan:  No problem-specific Assessment & Plan notes found for this encounter.    Nonallopathic problems  Decision today to treat with OMT was based on Physical Exam  After verbal consent patient was treated with HVLA, ME, FPR techniques in cervical, rib, thoracic, lumbar, and sacral  areas  Patient tolerated the procedure well with improvement in symptoms  Patient given exercises, stretches and lifestyle modifications  See medications in patient instructions if given  Patient will follow up in 4-8 weeks      The above documentation has been reviewed and is accurate and complete Lyndal Pulley, DO        Note: This dictation was prepared with Dragon dictation along with smaller phrase technology. Any transcriptional errors that result from this process are unintentional.

## 2021-08-19 ENCOUNTER — Other Ambulatory Visit: Payer: Self-pay | Admitting: Internal Medicine

## 2021-08-19 ENCOUNTER — Ambulatory Visit: Payer: 59 | Admitting: Family Medicine

## 2021-08-19 ENCOUNTER — Other Ambulatory Visit (HOSPITAL_COMMUNITY): Payer: Self-pay

## 2021-08-19 DIAGNOSIS — R0789 Other chest pain: Secondary | ICD-10-CM

## 2021-08-19 DIAGNOSIS — F411 Generalized anxiety disorder: Secondary | ICD-10-CM

## 2021-08-19 MED ORDER — ALPRAZOLAM 0.25 MG PO TABS
0.2500 mg | ORAL_TABLET | Freq: Two times a day (BID) | ORAL | 1 refills | Status: DC | PRN
  Filled 2021-08-19: qty 60, 30d supply, fill #0
  Filled 2022-01-24: qty 60, 30d supply, fill #1

## 2021-08-19 NOTE — Progress Notes (Signed)
°  [place] Phone: [phone] Subjective:   [name], am serving as a scribe for Dr. Hulan Saas. This visit occurred during the SARS-CoV-2 public health emergency.  Safety protocols were in place, including screening questions prior to the visit, additional usage of staff PPE, and extensive cleaning of exam room while observing appropriate contact time as indicated for disinfecting solutions.  I'm seeing this patient by the request  of:  Janith Lima, MD  CC: back and neck pain   [id number]  Priscilla [name] is a 36 y.o. female coming in with complaint of back and neck pain. OMT 07/07/2021. Patient states   Medications patient has been prescribed: Gabapentin  Taking:         Reviewed prior external information including notes and imaging from previsou exam, outside providers and external EMR if available.   As well as notes that were available from care everywhere and other healthcare systems.  Past medical history, social, surgical and family history all reviewed in electronic medical record.  No pertanent information unless stated regarding to the chief complaint.   Past Medical History:  Diagnosis Date   Abnormal Pap smear    Hx of varicella     Not on File   Review of Systems:  No headache, visual changes, nausea, vomiting, diarrhea, constipation, dizziness, abdominal pain, skin rash, fevers, chills, night sweats, weight loss, swollen lymph nodes, body aches, joint swelling, chest pain, shortness of breath, mood changes. POSITIVE muscle aches  Objective  Blood pressure 130/88, pulse 76, height 5\' 2"  (1.575 m), SpO2 99 %.   General: No apparent distress alert and oriented x3 mood and affect normal, dressed appropriately.  HEENT: Pupils equal, extraocular movements intact  Respiratory: Patient's speak in full sentences and does not appear short of breath  Neck exam does have some mild  loss of lordosis.  Patient does have tightness in the back with sidebending bilaterally.  Osteopathic findings  C2 flexed rotated and side bent right C6 flexed rotated and side bent left T3 extended rotated and side bent right inhaled rib T8 extended rotated and side bent left L2 flexed rotated and side bent right Sacrum right on right       Assessment and Plan:  SI (sacroiliac) joint dysfunction We will continue tightness noted.  Discussed icing regimen and home exercises.  Discussed which activities to do and which ones to avoid.  Increase activity slowly.  Discussed icing regimen.  Follow-up again in 4 to 8 weeks.    Nonallopathic problems  Decision today to treat with OMT was based on Physical Exam  After verbal consent patient was treated with HVLA, ME, FPR techniques in cervical, rib, thoracic, lumbar, and sacral  areas  Patient tolerated the procedure well with improvement in symptoms  Patient given exercises, stretches and lifestyle modifications  See medications in patient instructions if given  Patient will follow up in 4-8 weeks      The above documentation has been reviewed and is accurate and complete Priscilla Pulley, DO        Note: This dictation was prepared with Dragon dictation along with smaller phrase technology. Any transcriptional errors that result from this process are unintentional.

## 2021-08-20 DIAGNOSIS — Z719 Counseling, unspecified: Secondary | ICD-10-CM

## 2021-08-24 ENCOUNTER — Ambulatory Visit (INDEPENDENT_AMBULATORY_CARE_PROVIDER_SITE_OTHER): Payer: 59 | Admitting: Family Medicine

## 2021-08-24 ENCOUNTER — Other Ambulatory Visit: Payer: Self-pay

## 2021-08-24 ENCOUNTER — Encounter: Payer: Self-pay | Admitting: Family Medicine

## 2021-08-24 VITALS — BP 130/88 | HR 76 | Ht 62.0 in

## 2021-08-24 DIAGNOSIS — M9903 Segmental and somatic dysfunction of lumbar region: Secondary | ICD-10-CM | POA: Diagnosis not present

## 2021-08-24 DIAGNOSIS — M533 Sacrococcygeal disorders, not elsewhere classified: Secondary | ICD-10-CM | POA: Diagnosis not present

## 2021-08-24 DIAGNOSIS — M9902 Segmental and somatic dysfunction of thoracic region: Secondary | ICD-10-CM

## 2021-08-24 DIAGNOSIS — M9904 Segmental and somatic dysfunction of sacral region: Secondary | ICD-10-CM | POA: Diagnosis not present

## 2021-08-24 DIAGNOSIS — M9901 Segmental and somatic dysfunction of cervical region: Secondary | ICD-10-CM | POA: Diagnosis not present

## 2021-08-24 DIAGNOSIS — M9908 Segmental and somatic dysfunction of rib cage: Secondary | ICD-10-CM

## 2021-08-24 NOTE — Patient Instructions (Signed)
See me again in 6 weeks 

## 2021-08-24 NOTE — Assessment & Plan Note (Signed)
We will continue tightness noted.  Discussed icing regimen and home exercises.  Discussed which activities to do and which ones to avoid.  Increase activity slowly.  Discussed icing regimen.  Follow-up again in 4 to 8 weeks.

## 2021-08-25 DIAGNOSIS — D2271 Melanocytic nevi of right lower limb, including hip: Secondary | ICD-10-CM | POA: Diagnosis not present

## 2021-08-25 DIAGNOSIS — D1801 Hemangioma of skin and subcutaneous tissue: Secondary | ICD-10-CM | POA: Diagnosis not present

## 2021-08-25 DIAGNOSIS — D2261 Melanocytic nevi of right upper limb, including shoulder: Secondary | ICD-10-CM | POA: Diagnosis not present

## 2021-08-25 DIAGNOSIS — L918 Other hypertrophic disorders of the skin: Secondary | ICD-10-CM | POA: Diagnosis not present

## 2021-08-25 DIAGNOSIS — D2272 Melanocytic nevi of left lower limb, including hip: Secondary | ICD-10-CM | POA: Diagnosis not present

## 2021-08-25 DIAGNOSIS — D2262 Melanocytic nevi of left upper limb, including shoulder: Secondary | ICD-10-CM | POA: Diagnosis not present

## 2021-08-25 DIAGNOSIS — D225 Melanocytic nevi of trunk: Secondary | ICD-10-CM | POA: Diagnosis not present

## 2021-08-25 DIAGNOSIS — L814 Other melanin hyperpigmentation: Secondary | ICD-10-CM | POA: Diagnosis not present

## 2021-08-25 DIAGNOSIS — L821 Other seborrheic keratosis: Secondary | ICD-10-CM | POA: Diagnosis not present

## 2021-08-30 ENCOUNTER — Ambulatory Visit: Payer: 59 | Admitting: Family Medicine

## 2021-09-14 ENCOUNTER — Other Ambulatory Visit (HOSPITAL_COMMUNITY): Payer: Self-pay

## 2021-10-01 DIAGNOSIS — Z719 Counseling, unspecified: Secondary | ICD-10-CM

## 2021-10-06 NOTE — Progress Notes (Signed)
?  Priscilla [name] D.O. ?[facility] ?[place] ?Phone: [phone] ?Subjective:   ?I, Priscilla [name], am serving as a scribe for Dr. Hulan Saas. ? ?This visit occurred during the SARS-CoV-2 public health emergency.  Safety protocols were in place, including screening questions prior to the visit, additional usage of staff PPE, and extensive cleaning of exam room while observing appropriate contact time as indicated for disinfecting solutions.  ? ? ?I'm seeing this patient by the request  of:  Janith Lima, MD ? ?CC: back and neck pain  ? ?[id number]  ?Priscilla [name] is a 36 y.o. female coming in with complaint of back and neck pain. OMT on 08/24/2021. Patient states that she is doing ok. Her back is tight.  Patient does have some tenderness to palpation.  Patient though is going to be switching jobs recently. ? ?Medications patient has been prescribed: Gabapentin ? ? ? ? ?  ? ? ?Reviewed prior external information including notes and imaging from previsou exam, outside providers and external EMR if available.  ? ?As well as notes that were available from care everywhere and other healthcare systems. ? ?Past medical history, social, surgical and family history all reviewed in electronic medical record.  No pertanent information unless stated regarding to the chief complaint.  ? ?Past Medical History:  ?Diagnosis Date  ? Abnormal Pap smear   ? Hx of varicella   ?  ? ? ?Objective  ?Blood pressure 138/90, pulse 82, height '5\' 2"'$  (1.575 m), SpO2 98 %. ?  ?General: No apparent distress alert and oriented x3 mood and affect normal, dressed appropriately.  ?HEENT: Pupils equal, extraocular movements intact  ?Respiratory: Patient's speak in full sentences and does not appear short of breath  ?Cardiovascular: No lower extremity edema, non tender, no erythema  ?Back exam does have some mild loss of lordosis.  Some tenderness to palpation in the paraspinal musculature.  Tightness noted  with FABER test right compared to left. ? ?Osteopathic findings ? ?C2 flexed rotated and side bent right ?C7 flexed rotated and side bent left ?T3 extended rotated and side bent right inhaled rib ?T6 extended rotated and side bent left ?L2 flexed rotated and side bent right ?Sacrum right on right ? ? ? ? ?  ?Assessment and Plan: ? ?SI (sacroiliac) joint dysfunction ?Chronic problem but overall did respond well to osteopathic manipulation again today.  Patient will be in a different setting here soon and we may have to work on Personal assistant.  Discussed icing regimen exercises otherwise.  Follow-up again in 6 to 8 weeks. ?  ? ?Nonallopathic problems ? ?Decision today to treat with OMT was based on Physical Exam ? ?After verbal consent patient was treated with HVLA, ME, FPR techniques in cervical, rib, thoracic, lumbar, and sacral  areas ? ?Patient tolerated the procedure well with improvement in symptoms ? ?Patient given exercises, stretches and lifestyle modifications ? ?See medications in patient instructions if given ? ?Patient will follow up in 6-8 weeks ? ?  ? ?The above documentation has been reviewed and is accurate and complete Priscilla Pulley, DO ? ? ? ?  ? ? Note: This dictation was prepared with Dragon dictation along with smaller phrase technology. Any transcriptional errors that result from this process are unintentional.    ?  ?  ? ?

## 2021-10-07 ENCOUNTER — Other Ambulatory Visit: Payer: Self-pay

## 2021-10-07 ENCOUNTER — Ambulatory Visit (INDEPENDENT_AMBULATORY_CARE_PROVIDER_SITE_OTHER): Payer: 59 | Admitting: Family Medicine

## 2021-10-07 VITALS — BP 138/90 | HR 82 | Ht 62.0 in

## 2021-10-07 DIAGNOSIS — M9903 Segmental and somatic dysfunction of lumbar region: Secondary | ICD-10-CM

## 2021-10-07 DIAGNOSIS — M9902 Segmental and somatic dysfunction of thoracic region: Secondary | ICD-10-CM

## 2021-10-07 DIAGNOSIS — M9908 Segmental and somatic dysfunction of rib cage: Secondary | ICD-10-CM | POA: Diagnosis not present

## 2021-10-07 DIAGNOSIS — M9904 Segmental and somatic dysfunction of sacral region: Secondary | ICD-10-CM | POA: Diagnosis not present

## 2021-10-07 DIAGNOSIS — M533 Sacrococcygeal disorders, not elsewhere classified: Secondary | ICD-10-CM | POA: Diagnosis not present

## 2021-10-07 DIAGNOSIS — M9901 Segmental and somatic dysfunction of cervical region: Secondary | ICD-10-CM | POA: Diagnosis not present

## 2021-10-07 NOTE — Assessment & Plan Note (Signed)
Chronic problem but overall did respond well to osteopathic manipulation again today.  Patient will be in a different setting here soon and we may have to work on Personal assistant.  Discussed icing regimen exercises otherwise.  Follow-up again in 6 to 8 weeks. ?

## 2021-10-15 ENCOUNTER — Other Ambulatory Visit: Payer: Self-pay | Admitting: Family Medicine

## 2021-10-18 ENCOUNTER — Other Ambulatory Visit (HOSPITAL_COMMUNITY): Payer: Self-pay

## 2021-10-18 MED ORDER — GABAPENTIN 300 MG PO CAPS
ORAL_CAPSULE | Freq: Every day | ORAL | 1 refills | Status: DC
  Filled 2021-10-18: qty 30, 30d supply, fill #0
  Filled 2021-11-17: qty 30, 30d supply, fill #1

## 2021-10-21 DIAGNOSIS — Z719 Counseling, unspecified: Secondary | ICD-10-CM

## 2021-11-16 NOTE — Progress Notes (Signed)
?  Charlann Boxer D.O. ?[facility] ?[place] ?Phone: [phone] ?Subjective:   ? ?I'm seeing this patient by the request  of:  Janith Lima, MD ? ?CC: neck and back pain f/u  ? ?[id number]  ?Priscilla [name] is a 36 y.o. female coming in with complaint of back and neck pain. OMT on 10/07/2021. Patient states overall doing relatively well.  Mild tightness.  Been working out a little bit more. ? ?Medications patient has been prescribed: Gabapentin ? ?Taking: ? ? ?  ? ? ?Past Medical History:  ?Diagnosis Date  ? Abnormal Pap smear   ? Hx of varicella   ?  ?Not on File ? ? ?Review of Systems: ? No headache, visual changes, nausea, vomiting, diarrhea, constipation, dizziness, abdominal pain, skin rash, fevers, chills, night sweats, weight loss, swollen lymph nodes, body aches, joint swelling, chest pain, shortness of breath, mood changes. POSITIVE muscle aches ? ?Objective  ?Blood pressure 122/88, pulse 78, height '5\' 2"'$  (1.575 m), SpO2 98 %. ?  ?General: No apparent distress alert and oriented x3 mood and affect normal, dressed appropriately.  ?HEENT: Pupils equal, extraocular movements intact  ?Respiratory: Patient's speak in full sentences and does not appear short of breath  ?Cardiovascular: No lower extremity edema, non tender, no erythema  ?Back exam shows patient continues to have discomfort noted over the paraspinal musculature of the sacroiliac joints bilaterally.  Seems to be more right greater than left.  Positive Faber on the right side.  Negative straight leg test. ?Neck exam does have some loss of lordosis as well.  Limited sidebending bilaterally.  Patient does have tightness noted ? ?Osteopathic findings ? ?C2 flexed rotated and side bent right ?C5 flexed rotated and side bent left ?T3 extended rotated and side bent right inhaled rib ?T7 extended rotated and side bent left ?L2 flexed rotated and side bent right ?Sacrum right on right ? ? ? ? ?  ?Assessment and  Plan: ? ?SI (sacroiliac) joint dysfunction ?Continues to respond extremely well to osteopathic manipulation.  The patient does have the gabapentin and the meloxicam as needed.  Increase activity slowly.  Follow-up again in 6 to 8 weeks ?  ? ?Nonallopathic problems ? ?Decision today to treat with OMT was based on Physical Exam ? ?After verbal consent patient was treated with HVLA, ME, FPR techniques in cervical, rib, thoracic, lumbar, and sacral  areas ? ?Patient tolerated the procedure well with improvement in symptoms ? ?Patient given exercises, stretches and lifestyle modifications ? ?See medications in patient instructions if given ? ?Patient will follow up in 4-8 weeks ? ?  ? ? ?The above documentation has been reviewed and is accurate and complete Lyndal Pulley, DO ? ? ? ?  ? ? Note: This dictation was prepared with Dragon dictation along with smaller phrase technology. Any transcriptional errors that result from this process are unintentional.    ?  ?  ? ?

## 2021-11-17 ENCOUNTER — Ambulatory Visit (INDEPENDENT_AMBULATORY_CARE_PROVIDER_SITE_OTHER): Payer: 59 | Admitting: Family Medicine

## 2021-11-17 ENCOUNTER — Other Ambulatory Visit (HOSPITAL_COMMUNITY): Payer: Self-pay

## 2021-11-17 VITALS — BP 122/88 | HR 78 | Ht 62.0 in

## 2021-11-17 DIAGNOSIS — M9901 Segmental and somatic dysfunction of cervical region: Secondary | ICD-10-CM | POA: Diagnosis not present

## 2021-11-17 DIAGNOSIS — M533 Sacrococcygeal disorders, not elsewhere classified: Secondary | ICD-10-CM

## 2021-11-17 DIAGNOSIS — M9902 Segmental and somatic dysfunction of thoracic region: Secondary | ICD-10-CM

## 2021-11-17 DIAGNOSIS — M9903 Segmental and somatic dysfunction of lumbar region: Secondary | ICD-10-CM | POA: Diagnosis not present

## 2021-11-17 DIAGNOSIS — M9904 Segmental and somatic dysfunction of sacral region: Secondary | ICD-10-CM | POA: Diagnosis not present

## 2021-11-17 DIAGNOSIS — M9905 Segmental and somatic dysfunction of pelvic region: Secondary | ICD-10-CM | POA: Diagnosis not present

## 2021-11-17 NOTE — Patient Instructions (Signed)
See me in 6-8 weeks ?Great to see you ?

## 2021-11-17 NOTE — Assessment & Plan Note (Signed)
Continues to respond extremely well to osteopathic manipulation.  The patient does have the gabapentin and the meloxicam as needed.  Increase activity slowly.  Follow-up again in 6 to 8 weeks ?

## 2021-12-20 ENCOUNTER — Other Ambulatory Visit (HOSPITAL_BASED_OUTPATIENT_CLINIC_OR_DEPARTMENT_OTHER): Payer: Self-pay

## 2021-12-20 ENCOUNTER — Other Ambulatory Visit: Payer: Self-pay | Admitting: Family Medicine

## 2021-12-20 MED ORDER — GABAPENTIN 300 MG PO CAPS
ORAL_CAPSULE | Freq: Every day | ORAL | 1 refills | Status: DC
  Filled 2021-12-20: qty 30, 30d supply, fill #0
  Filled 2022-01-24: qty 30, 30d supply, fill #1

## 2022-01-04 NOTE — Progress Notes (Unsigned)
  [place] Phone: [phone] Subjective:   Priscilla [name], am serving as a scribe for Dr. Hulan Saas.   I'm seeing this patient by the request  of:  Janith Lima, MD  CC: Neck and back pain follow-up  [id number]  Priscilla [name] is a 36 y.o. female coming in with complaint of back and neck pain. OMT on 11/17/2021. Patient states that she has been well since last visit.   Medications patient has been prescribed: gabapentin  Taking:        Past Medical History:  Diagnosis Date   Abnormal Pap smear    Hx of varicella     Not on File   Review of Systems:  [name], visual changes, nausea, vomiting, diarrhea, constipation, dizziness, abdominal pain, skin rash, fevers, chills, night sweats, weight loss, swollen lymph nodes, body aches, joint swelling, chest pain, shortness of breath, mood changes. POSITIVE muscle aches  Objective  Blood pressure 112/82, pulse 68, height '5\' 2"'$  (1.575 m), SpO2 98 %.   General: [name] alert and oriented x3 mood and affect normal, dressed appropriately.  HEENT: Pupils equal, extraocular movements intact  Respiratory: Patient's speak in full sentences and does not appear short of breath  Cardiovascular: [name] lower extremity edema, non tender, [name] erythema  Neck exam shows mild limited sidebending. Likely [name] radicular symptoms at the moment. Lower back exam shows some mild loss of lordosis.  Still tightness around the sacroiliac joint.  Osteopathic findings  C2 flexed rotated and side bent right C5 flexed rotated and side bent left T3 extended rotated and side bent right inhaled rib T9 extended rotated and side bent left L2 flexed rotated and side bent right Sacrum right on right       Assessment and Plan:  SI (sacroiliac) joint dysfunction Pain is still in the lower back.  Very mild overall.  I have seen patient had more tightness  previously.  Is working with a Clinical research associate.  Discussed icing regimen and home exercises.  Much activity sitting much.  Voiding.  Follow-up again in 6 to 8 weeks    Nonallopathic problems  Decision today to treat with OMT was based on Physical Exam  After verbal consent patient was treated with HVLA, ME, FPR techniques in cervical, rib, thoracic, lumbar, and sacral  areas  Patient tolerated the procedure well with improvement in symptoms  Patient given exercises, stretches and lifestyle modifications  See medications in patient instructions if given  Patient will follow up in 4-8 weeks     The above documentation has been reviewed and is accurate and complete Lyndal Pulley, DO        Note: This dictation was prepared with Dragon dictation along with smaller phrase technology. Any transcriptional errors that result from this process are unintentional.

## 2022-01-05 ENCOUNTER — Ambulatory Visit (INDEPENDENT_AMBULATORY_CARE_PROVIDER_SITE_OTHER): Payer: Self-pay | Admitting: Plastic Surgery

## 2022-01-05 ENCOUNTER — Ambulatory Visit (INDEPENDENT_AMBULATORY_CARE_PROVIDER_SITE_OTHER): Payer: 59 | Admitting: Family Medicine

## 2022-01-05 ENCOUNTER — Encounter: Payer: Self-pay | Admitting: Plastic Surgery

## 2022-01-05 VITALS — BP 112/82 | HR 68 | Ht 62.0 in

## 2022-01-05 DIAGNOSIS — M9903 Segmental and somatic dysfunction of lumbar region: Secondary | ICD-10-CM | POA: Diagnosis not present

## 2022-01-05 DIAGNOSIS — M9902 Segmental and somatic dysfunction of thoracic region: Secondary | ICD-10-CM

## 2022-01-05 DIAGNOSIS — Z411 Encounter for cosmetic surgery: Secondary | ICD-10-CM

## 2022-01-05 DIAGNOSIS — M9904 Segmental and somatic dysfunction of sacral region: Secondary | ICD-10-CM | POA: Diagnosis not present

## 2022-01-05 DIAGNOSIS — M533 Sacrococcygeal disorders, not elsewhere classified: Secondary | ICD-10-CM

## 2022-01-05 DIAGNOSIS — M9908 Segmental and somatic dysfunction of rib cage: Secondary | ICD-10-CM | POA: Diagnosis not present

## 2022-01-05 DIAGNOSIS — M9901 Segmental and somatic dysfunction of cervical region: Secondary | ICD-10-CM | POA: Diagnosis not present

## 2022-01-05 NOTE — Assessment & Plan Note (Signed)
Pain is still in the lower back.  Very mild overall.  I have seen patient had more tightness previously.  Is working with a Clinical research associate.  Discussed icing regimen and home exercises.  Much activity sitting much.  Voiding.  Follow-up again in 6 to 8 weeks

## 2022-01-05 NOTE — Progress Notes (Signed)
Patient presents to discuss neuromodulator treatment.  In the past we have done 36 units of Botox or Dysport equivalent.  She is interested in a similar treatment today.  We reviewed the risks and benefits of neuromodulator treatment and she is interested in moving forward.  The forehead, the bell and crows feet were prepped with an alcohol pad and 90 units of Dysport were distributed throughout.  I used 2 injection points for the crows feet and 1 line for the forehead.  She tolerated this fine.  We will plan to see her for any touchups if needed and otherwise at her next visit.  All of her questions were answered

## 2022-01-05 NOTE — Patient Instructions (Signed)
Great to see you as always  Make sure you feel good enough for the beach  See me again in 6-8 weeks

## 2022-01-24 ENCOUNTER — Other Ambulatory Visit (HOSPITAL_BASED_OUTPATIENT_CLINIC_OR_DEPARTMENT_OTHER): Payer: Self-pay

## 2022-03-01 ENCOUNTER — Other Ambulatory Visit: Payer: Self-pay | Admitting: Family Medicine

## 2022-03-02 ENCOUNTER — Other Ambulatory Visit (HOSPITAL_BASED_OUTPATIENT_CLINIC_OR_DEPARTMENT_OTHER): Payer: Self-pay

## 2022-03-02 MED ORDER — GABAPENTIN 300 MG PO CAPS
ORAL_CAPSULE | Freq: Every day | ORAL | 1 refills | Status: DC
  Filled 2022-03-02: qty 30, 30d supply, fill #0
  Filled 2022-04-05: qty 30, 30d supply, fill #1

## 2022-03-08 NOTE — Progress Notes (Unsigned)
  Priscilla [name] [address] [place] Phone: [phone] Subjective:   Priscilla [name], am serving as a scribe for Dr. Hulan Saas.  I'm seeing this patient by the request  of:  Janith Lima, MD  CC: Back and neck pain follow-up  [id number]  Priscilla [name] is a 36 y.o. female coming in with complaint of back and neck pain. OMT on 01/05/2022. Patient states same per usual. No new issues.  Medications patient has been prescribed: Gabapentin  Taking:         Reviewed prior external information including notes and imaging from previsou exam, outside providers and external EMR if available.   As well as notes that were available from care everywhere and other healthcare systems.  Past medical history, social, surgical and family history all reviewed in electronic medical record.  No pertanent information unless stated regarding to the chief complaint.   Past Medical History:  Diagnosis Date   Abnormal Pap smear    Hx of varicella     Not on File   Review of Systems:  No headache, visual changes, nausea, vomiting, diarrhea, constipation, dizziness, abdominal pain, skin rash, fevers, chills, night sweats, weight loss, swollen lymph nodes, body aches, joint swelling, chest pain, shortness of breath, mood changes. POSITIVE muscle aches  Objective  Blood pressure 114/78, pulse 86, height '5\' 2"'$  (1.575 m), weight 165 lb (74.8 kg), SpO2 98 %.   General: No apparent distress alert and oriented x3 mood and affect normal, dressed appropriately.  HEENT: Pupils equal, extraocular movements intact  Respiratory: Patient's speak in full sentences and does not appear short of breath  Cardiovascular: No lower extremity edema, non tender, no erythema  MSK:  Back tightness noted in the sacroiliac joint.  Patient has not improvement though in core strength.  Negative straight leg test noted.  Osteopathic findings  C5 flexed rotated and side  bent left T9 extended rotated and side bent left L1 flexed rotated and side bent right Sacrum right on right       Assessment and Plan:  SI (sacroiliac) joint dysfunction Chronic but stable.  Discussed posture and ergonomics, discussed which activities to do and which ones to avoid.  Increase activity slowly otherwise.  Follow-up with me again in 6 to 8 weeks.    Nonallopathic problems  Decision today to treat with OMT was based on Physical Exam  After verbal consent patient was treated with HVLA, ME, FPR techniques in cervical, , thoracic, lumbar, and sacral  areas  Patient tolerated the procedure well with improvement in symptoms  Patient given exercises, stretches and lifestyle modifications  See medications in patient instructions if given  Patient will follow up in 4-8 weeks    The above documentation has been reviewed and is accurate and complete Lyndal Pulley, DO          Note: This dictation was prepared with Dragon dictation along with smaller phrase technology. Any transcriptional errors that result from this process are unintentional.

## 2022-03-09 ENCOUNTER — Ambulatory Visit (INDEPENDENT_AMBULATORY_CARE_PROVIDER_SITE_OTHER): Payer: 59 | Admitting: Family Medicine

## 2022-03-09 VITALS — BP 114/78 | HR 86 | Ht 62.0 in | Wt 165.0 lb

## 2022-03-09 DIAGNOSIS — M9903 Segmental and somatic dysfunction of lumbar region: Secondary | ICD-10-CM | POA: Diagnosis not present

## 2022-03-09 DIAGNOSIS — M9902 Segmental and somatic dysfunction of thoracic region: Secondary | ICD-10-CM | POA: Diagnosis not present

## 2022-03-09 DIAGNOSIS — M533 Sacrococcygeal disorders, not elsewhere classified: Secondary | ICD-10-CM | POA: Diagnosis not present

## 2022-03-09 DIAGNOSIS — M9901 Segmental and somatic dysfunction of cervical region: Secondary | ICD-10-CM

## 2022-03-09 DIAGNOSIS — M9904 Segmental and somatic dysfunction of sacral region: Secondary | ICD-10-CM | POA: Diagnosis not present

## 2022-03-09 NOTE — Assessment & Plan Note (Signed)
Chronic but stable.  Discussed posture and ergonomics, discussed which activities to do and which ones to avoid.  Increase activity slowly otherwise.  Follow-up with me again in 6 to 8 weeks.

## 2022-04-05 ENCOUNTER — Other Ambulatory Visit: Payer: Self-pay | Admitting: Family Medicine

## 2022-04-06 ENCOUNTER — Other Ambulatory Visit (HOSPITAL_BASED_OUTPATIENT_CLINIC_OR_DEPARTMENT_OTHER): Payer: Self-pay

## 2022-04-06 MED ORDER — MELOXICAM 15 MG PO TABS
15.0000 mg | ORAL_TABLET | Freq: Every day | ORAL | 0 refills | Status: AC
  Filled 2022-04-06: qty 30, 30d supply, fill #0

## 2022-04-11 ENCOUNTER — Ambulatory Visit (INDEPENDENT_AMBULATORY_CARE_PROVIDER_SITE_OTHER): Payer: Self-pay | Admitting: Surgical

## 2022-04-11 DIAGNOSIS — Z411 Encounter for cosmetic surgery: Secondary | ICD-10-CM

## 2022-04-11 NOTE — Progress Notes (Signed)
Botulinum Toxin Procedure Note  Procedure: Cosmetic botulinum toxin  Pre-operative Diagnosis: Dynamic rhytides  Post-operative Diagnosis: Same  Complications:  None  Brief history: The patient desires botulinum toxin injection.  She is aware of the risks including bleeding, damage to deeper structures, asymmetry, brow ptosis, eyelid ptosis, bruising. The patient understands and wishes to proceed.  Procedure: The area was prepped with alcohol and dried with a clean gauze.  Using a clean technique the botulinum toxin was diluted with 2.5 mL of bacteriostatic saline per 300 unit vial which resulted in 12 units per 0.1 mL.  Subsequently the mixture was injected in the glabellar, lateral canthal lines, forehead area with preservation of the temporal branch to the lateral eyebrow. A total of 60 Units of botulinum toxin was used. The forehead and glabellar area was injected with care to inject intramuscular only while holding pressure on the supratrochlear vessels in each area during each injection on either side of the medial corrugators. The injection proceeded vertically superiorly to the medial 2/3 of the frontalis muscle and superior 2/3 of the lateral frontalis, again with preservation of the frontal branch.  No complications were noted. Light pressure was held for a few minutes.   Dysport LOT: [id number] EXP: 12/30/2023

## 2022-04-18 ENCOUNTER — Telehealth: Payer: 59

## 2022-04-18 ENCOUNTER — Encounter: Payer: 59 | Admitting: Plastic Surgery

## 2022-05-09 NOTE — Progress Notes (Unsigned)
  Zach Meg Niemeier Prichard [address] [place] Phone: [phone] Subjective:   [name], am serving as a scribe for Dr. Hulan Saas.  I'm seeing this patient by the request  of:  Janith Lima, MD  CC: Back and neck pain follow-up  Priscilla [name]  Priscilla [name] is a 36 y.o. female coming in with complaint of back and neck pain. OMT on 03/09/2022. Patient states doing well. Here for manipulation. No new complaints.  Medications patient has been prescribed: Gabapentin Meloxicam  Taking: Intermittently     Past Medical History:  Diagnosis Date   Abnormal Pap smear    Hx of varicella        Review of Systems:  No headache, visual changes, nausea, vomiting, diarrhea, constipation, dizziness, abdominal pain, skin rash, fevers, chills, night sweats, weight loss, swollen lymph nodes, body aches, joint swelling, chest pain, shortness of breath, mood changes. POSITIVE muscle aches  Objective  Blood pressure 124/86, pulse 92, height '5\' 2"'$  (1.575 m), weight 170 lb (77.1 kg), SpO2 98 %.   General: No apparent distress alert and oriented x3 mood and affect normal, dressed appropriately.  HEENT: Pupils equal, extraocular movements intact  Respiratory: Patient's speak in full sentences and does not appear short of breath  Cardiovascular: No lower extremity edema, non tender, no erythema  MSK:  Back does have some loss of lordosis noted.  Patient does have some tightness noted over the sacroiliac joint right greater than left.  Negative straight leg test today.  Some limited sidebending of the neck bilaterally.  Osteopathic findings  C3 flexed rotated and side bent right C6 flexed rotated and side bent left T3 extended rotated and side bent right inhaled rib T9 extended rotated and side bent left L2 flexed rotated and side bent right Sacrum right on right       Assessment and Plan:  SI (sacroiliac) joint dysfunction Tightness in  the sacroiliac joint.  Right greater than left.  Does have meloxicam noted.  Did do some traveling.  Overall doing relatively well at the moment.  Follow-up again in 6 to 8 weeks    Nonallopathic problems  Decision today to treat with OMT was based on Physical Exam  After verbal consent patient was treated with HVLA, ME, FPR techniques in cervical, rib, thoracic, lumbar, and sacral  areas  Patient tolerated the procedure well with improvement in symptoms  Patient given exercises, stretches and lifestyle modifications  See medications in patient instructions if given  Patient will follow up in 4-8 weeks     The above documentation has been reviewed and is accurate and complete Lyndal Pulley, DO         Note: This dictation was prepared with Dragon dictation along with smaller phrase technology. Any transcriptional errors that result from this process are unintentional.

## 2022-05-10 ENCOUNTER — Ambulatory Visit (INDEPENDENT_AMBULATORY_CARE_PROVIDER_SITE_OTHER): Payer: 59

## 2022-05-10 DIAGNOSIS — Z23 Encounter for immunization: Secondary | ICD-10-CM

## 2022-05-11 ENCOUNTER — Ambulatory Visit (INDEPENDENT_AMBULATORY_CARE_PROVIDER_SITE_OTHER): Payer: 59 | Admitting: Family Medicine

## 2022-05-11 ENCOUNTER — Other Ambulatory Visit: Payer: Self-pay | Admitting: Family Medicine

## 2022-05-11 ENCOUNTER — Other Ambulatory Visit (HOSPITAL_BASED_OUTPATIENT_CLINIC_OR_DEPARTMENT_OTHER): Payer: Self-pay

## 2022-05-11 VITALS — BP 124/86 | HR 92 | Ht 62.0 in | Wt 170.0 lb

## 2022-05-11 DIAGNOSIS — M9908 Segmental and somatic dysfunction of rib cage: Secondary | ICD-10-CM

## 2022-05-11 DIAGNOSIS — M9903 Segmental and somatic dysfunction of lumbar region: Secondary | ICD-10-CM | POA: Diagnosis not present

## 2022-05-11 DIAGNOSIS — M9902 Segmental and somatic dysfunction of thoracic region: Secondary | ICD-10-CM | POA: Diagnosis not present

## 2022-05-11 DIAGNOSIS — M9904 Segmental and somatic dysfunction of sacral region: Secondary | ICD-10-CM | POA: Diagnosis not present

## 2022-05-11 DIAGNOSIS — M533 Sacrococcygeal disorders, not elsewhere classified: Secondary | ICD-10-CM | POA: Diagnosis not present

## 2022-05-11 DIAGNOSIS — M9901 Segmental and somatic dysfunction of cervical region: Secondary | ICD-10-CM

## 2022-05-11 MED ORDER — GABAPENTIN 300 MG PO CAPS
ORAL_CAPSULE | Freq: Every day | ORAL | 1 refills | Status: DC
  Filled 2022-05-11: qty 30, 30d supply, fill #0
  Filled 2022-06-14: qty 30, 30d supply, fill #1

## 2022-05-11 NOTE — Assessment & Plan Note (Signed)
Tightness in the sacroiliac joint.  Right greater than left.  Does have meloxicam noted.  Did do some traveling.  Overall doing relatively well at the moment.  Follow-up again in 6 to 8 weeks

## 2022-05-11 NOTE — Patient Instructions (Signed)
Good to see you! Glad you survived [place]

## 2022-05-30 DIAGNOSIS — Z6829 Body mass index (BMI) 29.0-29.9, adult: Secondary | ICD-10-CM | POA: Diagnosis not present

## 2022-05-30 DIAGNOSIS — Z01419 Encounter for gynecological examination (general) (routine) without abnormal findings: Secondary | ICD-10-CM | POA: Diagnosis not present

## 2022-06-06 ENCOUNTER — Other Ambulatory Visit: Payer: Self-pay | Admitting: Internal Medicine

## 2022-06-06 ENCOUNTER — Other Ambulatory Visit (HOSPITAL_BASED_OUTPATIENT_CLINIC_OR_DEPARTMENT_OTHER): Payer: Self-pay

## 2022-06-06 DIAGNOSIS — B001 Herpesviral vesicular dermatitis: Secondary | ICD-10-CM

## 2022-06-06 MED ORDER — VALACYCLOVIR HCL 1 G PO TABS
2000.0000 mg | ORAL_TABLET | Freq: Two times a day (BID) | ORAL | 1 refills | Status: DC
Start: 2022-06-06 — End: 2022-11-04
  Filled 2022-06-06: qty 4, 1d supply, fill #0
  Filled 2022-08-30: qty 4, 1d supply, fill #1

## 2022-06-13 ENCOUNTER — Ambulatory Visit (INDEPENDENT_AMBULATORY_CARE_PROVIDER_SITE_OTHER): Payer: 59 | Admitting: Internal Medicine

## 2022-06-13 ENCOUNTER — Encounter: Payer: Self-pay | Admitting: Internal Medicine

## 2022-06-13 VITALS — BP 118/78 | HR 75 | Temp 98.6°F | Ht 62.0 in | Wt 163.0 lb

## 2022-06-13 DIAGNOSIS — Z0000 Encounter for general adult medical examination without abnormal findings: Secondary | ICD-10-CM | POA: Diagnosis not present

## 2022-06-13 DIAGNOSIS — Z23 Encounter for immunization: Secondary | ICD-10-CM | POA: Diagnosis not present

## 2022-06-13 NOTE — Progress Notes (Signed)
   Subjective:  Patient ID: Priscilla [name], female    DOB: 1985/10/05  Age: 36 y.o. MRN: [id number]  CC: No chief complaint on file.   HPI ANGELIS GATES presents for ***  Outpatient Medications Prior to Visit  Medication Sig Dispense Refill   ALPRAZolam (XANAX) 0.25 MG tablet Take 1 tablet (0.25 mg total) by mouth 2 (two) times daily as needed for anxiety (panic attack). 60 tablet 1   fluorouracil (EFUDEX) 5 % cream Apply to the spots on the skin once daily for 2 weeks as directed 40 g 0   gabapentin (NEURONTIN) 300 MG capsule TAKE 1 CAPSULE BY MOUTH AT BEDTIME 30 capsule 1   levonorgestrel (MIRENA) 20 MCG/24HR IUD 1 each by Intrauterine route once.     meloxicam (MOBIC) 15 MG tablet Take 1 tablet (15 mg total) by mouth daily. 30 tablet 0   pantoprazole (PROTONIX) 40 MG tablet Take 1 tablet by mouth daily. 30 tablet 3   valACYclovir (VALTREX) 1000 MG tablet Take 2 tablets (2,000 mg total) by mouth 2 (two) times daily for 1 day. 4 tablet 1   No facility-administered medications prior to visit.    ROS Review of Systems  Objective:  Ht '5\' 2"'$  (1.575 m)   BMI 31.09 kg/m   BP Readings from Last 3 Encounters:  05/11/22 124/86  03/09/22 114/78  01/05/22 112/82    Wt Readings from Last 3 Encounters:  05/11/22 170 lb (77.1 kg)  03/09/22 165 lb (74.8 kg)  08/12/21 157 lb (71.2 kg)    Physical Exam  Lab Results  Component Value Date   WBC 8.9 08/12/2021   HGB 13.7 08/12/2021   HCT 41.3 08/12/2021   PLT 301.0 08/12/2021   GLUCOSE 92 08/12/2021   CHOL 188 11/15/2017   TRIG 96.0 11/15/2017   HDL 81.60 11/15/2017   LDLCALC 87 11/15/2017   ALT 14 08/12/2021   AST 20 08/12/2021   NA 138 08/12/2021   K 3.7 08/12/2021   CL 103 08/12/2021   CREATININE 0.80 08/12/2021   BUN 13 08/12/2021   CO2 27 08/12/2021   TSH 1.47 08/12/2021   PSA GYN 03/20/2018   HGBA1C 5.4 11/15/2017    DG Chest 2 View  Result Date: 08/12/2021 CLINICAL DATA:  [name], now resolved  EXAM: CHEST - 2 VIEW COMPARISON:  None. FINDINGS: Cardiac and mediastinal contours are within normal limits. No focal pulmonary opacity. No pleural effusion or pneumothorax. No acute osseous abnormality. IMPRESSION: No acute cardiopulmonary process. Electronically Signed   By: Merilyn Baba M.D.   On: 08/12/2021 12:52    Assessment & Plan:   There are no diagnoses linked to this encounter. I am having [name] maintain her levonorgestrel, fluorouracil, pantoprazole, ALPRAZolam, meloxicam, gabapentin, and valACYclovir.  No orders of the defined types were placed in this encounter.    Follow-up: No follow-ups on file.  [name], MD

## 2022-06-13 NOTE — Patient Instructions (Signed)

## 2022-06-14 ENCOUNTER — Other Ambulatory Visit (HOSPITAL_BASED_OUTPATIENT_CLINIC_OR_DEPARTMENT_OTHER): Payer: Self-pay

## 2022-06-15 ENCOUNTER — Encounter: Payer: Self-pay | Admitting: Internal Medicine

## 2022-06-16 DIAGNOSIS — Z0100 Encounter for examination of eyes and vision without abnormal findings: Secondary | ICD-10-CM | POA: Diagnosis not present

## 2022-06-22 ENCOUNTER — Other Ambulatory Visit (HOSPITAL_BASED_OUTPATIENT_CLINIC_OR_DEPARTMENT_OTHER): Payer: Self-pay

## 2022-06-22 ENCOUNTER — Telehealth: Payer: 59 | Admitting: Physician Assistant

## 2022-06-22 DIAGNOSIS — R3989 Other symptoms and signs involving the genitourinary system: Secondary | ICD-10-CM | POA: Diagnosis not present

## 2022-06-22 MED ORDER — CEPHALEXIN 500 MG PO CAPS
500.0000 mg | ORAL_CAPSULE | Freq: Two times a day (BID) | ORAL | 0 refills | Status: DC
  Filled 2022-06-22: qty 14, 7d supply, fill #0

## 2022-06-22 NOTE — Progress Notes (Signed)

## 2022-07-06 NOTE — Progress Notes (Signed)
  [name] [place] Phone: [phone] Subjective:   Rito Ehrlich, am serving as a scribe for Dr. Hulan Saas.  I'm seeing this patient by the request  of:  Janith Lima, MD  CC: Back and neck pain follow-up  [id number]  Priscilla [name] is a 36 y.o. female coming in with complaint of back and neck pain. OMT 05/11/2022. Patient states here for routine OMT.  Medications patient has been prescribed: Gabapentin  Taking:         Reviewed prior external information including notes and imaging from previsou exam, outside providers and external EMR if available.   As well as notes that were available from care everywhere and other healthcare systems.  Past medical history, social, surgical and family history all reviewed in electronic medical record.  No pertanent information unless stated regarding to the chief complaint.   Past Medical History:  Diagnosis Date   Abnormal Pap smear    Hx of varicella      Review of Systems:  No headache, visual changes, nausea, vomiting, diarrhea, constipation, dizziness, abdominal pain, skin rash, fevers, chills, night sweats, weight loss, swollen lymph nodes, body aches, joint swelling, chest pain, shortness of breath, mood changes. POSITIVE muscle aches  Objective  Blood pressure 112/70, pulse 74, height '5\' 2"'$  (1.575 m), SpO2 99 %.   General: No apparent distress alert and oriented x3 mood and affect normal, dressed appropriately.  HEENT: Pupils equal, extraocular movements intact  Respiratory: Patient's speak in full sentences and does not appear short of breath  Cardiovascular: No lower extremity edema, non tender, no erythema  Low back exam does have some loss of lordosis.  Still has significant tightness of the neck with sidebending bilaterally right greater than left.  Negative Spurling's noted today.  Good grip strength noted.  Osteopathic findings  C2  flexed rotated and side bent right C6 flexed rotated and side bent right T3 extended rotated and side bent right inhaled rib T8 extended rotated and side bent left L2 flexed rotated and side bent right L4 flexed rotated and side bent left Sacrum right on right       Assessment and Plan:  SI (sacroiliac) joint dysfunction Low back does have tightness  Positive faber noted as well  Negative straight leg test.  Is responding well to medication.  Follow-up again in 6 to 8 weeks    Nonallopathic problems  Decision today to treat with OMT was based on Physical Exam  After verbal consent patient was treated with HVLA, ME, FPR techniques in cervical, rib, thoracic, lumbar, and sacral  areas  Patient tolerated the procedure well with improvement in symptoms  Patient given exercises, stretches and lifestyle modifications  See medications in patient instructions if given  Patient will follow up in 4-8 weeks     The above documentation has been reviewed and is accurate and complete Priscilla Pulley, DO         Note: This dictation was prepared with Dragon dictation along with smaller phrase technology. Any transcriptional errors that result from this process are unintentional.

## 2022-07-11 ENCOUNTER — Other Ambulatory Visit: Payer: Self-pay | Admitting: Family Medicine

## 2022-07-11 ENCOUNTER — Ambulatory Visit (INDEPENDENT_AMBULATORY_CARE_PROVIDER_SITE_OTHER): Payer: Self-pay | Admitting: Surgical

## 2022-07-11 DIAGNOSIS — Z411 Encounter for cosmetic surgery: Secondary | ICD-10-CM

## 2022-07-11 NOTE — Progress Notes (Signed)
Botulinum Toxin Procedure Note  Procedure: Cosmetic botulinum toxin  Pre-operative Diagnosis: Dynamic rhytides  Post-operative Diagnosis: Same  Complications:  None  Brief history: The patient desires botulinum toxin injection.  She is aware of the risks including bleeding, damage to deeper structures, asymmetry, brow ptosis, eyelid ptosis, bruising. The patient understands and wishes to proceed.  Procedure: The area was prepped with alcohol and dried with a clean gauze.  Using a clean technique the botulinum toxin was diluted with 2.5 mL of bacteriostatic saline per 100 unit vial which resulted in 4 units per 0.1 mL.  Subsequently the mixture was injected in the glabellar, lateral canthal lines, forehead area with preservation of the temporal branch to the lateral eyebrow. A total of 24 Units of botulinum toxin was used. The forehead and glabellar area was injected with care to inject intramuscular only while holding pressure on the supratrochlear vessels in each area during each injection on either side of the medial corrugators. The injection proceeded vertically superiorly to the medial 2/3 of the frontalis muscle and superior 2/3 of the lateral frontalis, again with preservation of the frontal branch.  No complications were noted. Light pressure was held for 5 minutes. She was instructed explicitly in post-operative care.  Botox LOT:  [id number] EXP:  09/2024

## 2022-07-12 ENCOUNTER — Ambulatory Visit (INDEPENDENT_AMBULATORY_CARE_PROVIDER_SITE_OTHER): Payer: 59 | Admitting: Family Medicine

## 2022-07-12 ENCOUNTER — Other Ambulatory Visit (HOSPITAL_BASED_OUTPATIENT_CLINIC_OR_DEPARTMENT_OTHER): Payer: Self-pay

## 2022-07-12 VITALS — BP 112/70 | HR 74 | Ht 62.0 in

## 2022-07-12 DIAGNOSIS — M9901 Segmental and somatic dysfunction of cervical region: Secondary | ICD-10-CM

## 2022-07-12 DIAGNOSIS — M9902 Segmental and somatic dysfunction of thoracic region: Secondary | ICD-10-CM

## 2022-07-12 DIAGNOSIS — M533 Sacrococcygeal disorders, not elsewhere classified: Secondary | ICD-10-CM | POA: Diagnosis not present

## 2022-07-12 DIAGNOSIS — M9904 Segmental and somatic dysfunction of sacral region: Secondary | ICD-10-CM | POA: Diagnosis not present

## 2022-07-12 DIAGNOSIS — M9903 Segmental and somatic dysfunction of lumbar region: Secondary | ICD-10-CM | POA: Diagnosis not present

## 2022-07-12 DIAGNOSIS — M9908 Segmental and somatic dysfunction of rib cage: Secondary | ICD-10-CM

## 2022-07-12 MED ORDER — GABAPENTIN 300 MG PO CAPS
300.0000 mg | ORAL_CAPSULE | Freq: Every day | ORAL | 1 refills | Status: DC
  Filled 2022-07-12: qty 30, 30d supply, fill #0
  Filled 2022-08-08: qty 30, 30d supply, fill #1

## 2022-07-12 NOTE — Assessment & Plan Note (Signed)
Low back does have tightness  Positive faber noted as well  Negative straight leg test.  Is responding well to medication.  Follow-up again in 6 to 8 weeks

## 2022-07-12 NOTE — Patient Instructions (Signed)
Good to see you Tell santa Hi See me in 7-8 weeks

## 2022-07-15 ENCOUNTER — Telehealth: Payer: 59 | Admitting: Emergency Medicine

## 2022-07-15 ENCOUNTER — Other Ambulatory Visit (HOSPITAL_BASED_OUTPATIENT_CLINIC_OR_DEPARTMENT_OTHER): Payer: Self-pay

## 2022-07-15 DIAGNOSIS — J019 Acute sinusitis, unspecified: Secondary | ICD-10-CM | POA: Diagnosis not present

## 2022-07-15 DIAGNOSIS — B9689 Other specified bacterial agents as the cause of diseases classified elsewhere: Secondary | ICD-10-CM

## 2022-07-15 MED ORDER — AMOXICILLIN-POT CLAVULANATE 875-125 MG PO TABS
1.0000 | ORAL_TABLET | Freq: Two times a day (BID) | ORAL | 0 refills | Status: DC
  Filled 2022-07-15: qty 14, 7d supply, fill #0

## 2022-07-15 NOTE — Progress Notes (Signed)
E-Visit for Sinus Problems  We are sorry that you are not feeling well.  Here is how we plan to help!  Based on what you have shared with me it looks like you have sinusitis.  Sinusitis is inflammation and infection in the sinus cavities of the head.  Based on your presentation I believe you most likely have Acute Bacterial Sinusitis.  This is an infection caused by bacteria and is treated with antibiotics. I have prescribed Augmentin '875mg'$ /'125mg'$  one tablet twice daily with food, for 7 days.   You may use an oral decongestant such as Mucinex D or if you have glaucoma or high blood pressure use plain Mucinex.   Saline nasal spray help and can safely be used as often as needed for congestion.  Try using saline irrigation, such as with a neti pot, several times a day while you are sick. Many neti pots come with salt packets premeasured to use to make saline. If you use your own salt, make sure it is kosher salt or sea salt (don't use table salt as it has iodine in it and you don't need that in your nose). Use distilled water to make saline. If you mix your own saline using your own salt, the recipe is 1/4 teaspoon salt in 1 cup warm water. Using saline irrigation can help prevent and treat sinus infections.   If you develop worsening sinus pain, fever or notice severe headache and vision changes, or if symptoms are not better after completion of antibiotic, please schedule an appointment with a health care provider.    Sinus infections are not as easily transmitted as other respiratory infection, however we still recommend that you avoid close contact with loved ones, especially the very young and elderly.  Remember to wash your hands thoroughly throughout the day as this is the number one way to prevent the spread of infection!  Home Care: Only take medications as instructed by your medical team. Complete the entire course of an antibiotic. Do not take these medications with alcohol. A steam or  ultrasonic humidifier can help congestion.  You can place a towel over your head and breathe in the steam from hot water coming from a faucet. Avoid close contacts especially the very young and the elderly. Cover your mouth when you cough or sneeze. Always remember to wash your hands.  Get Help Right Away If: You develop worsening fever or sinus pain. You develop a severe head ache or visual changes. Your symptoms persist after you have completed your treatment plan.  Make sure you Understand these instructions. Will watch your condition. Will get help right away if you are not doing well or get worse.  Thank you for choosing an e-visit.  Your e-visit answers were reviewed by a board certified advanced clinical practitioner to complete your personal care plan. Depending upon the condition, your plan could have included both over the counter or prescription medications.  Please review your pharmacy choice. Make sure the pharmacy is open so you can pick up prescription now. If there is a problem, you may contact your provider through [employer] and have the prescription routed to another pharmacy.  Your safety is important to [place]. If you have drug allergies check your prescription carefully.   For the next 24 hours you can use MyChart to ask questions about today's visit, request a non-urgent call back, or ask for a work or school excuse. You will get an email in the next two days asking  about your experience. I hope that your e-visit has been valuable and will speed your recovery.  I have spent 5 minutes in review of e-visit questionnaire, review and updating patient chart, medical decision making and response to patient.   Willeen Cass, PhD, FNP-BC

## 2022-08-08 ENCOUNTER — Other Ambulatory Visit (HOSPITAL_BASED_OUTPATIENT_CLINIC_OR_DEPARTMENT_OTHER): Payer: Self-pay

## 2022-08-26 NOTE — Progress Notes (Unsigned)
  Priscilla Priscilla [name] Phone: [phone] Subjective:   Priscilla Priscilla [name], am serving as a scribe for Dr. Hulan Saas.  I'm seeing this patient by the request  of:  Priscilla Lima, MD  CC: Back and neck pain  [id number]  Priscilla Priscilla [name] is a 37 y.o. female coming in with complaint of back and neck pain. OMT on 07/12/2022. Patient states that she has been doing relatively well.  Medications patient has been prescribed: Gabapentin  Taking:         Reviewed prior external information including notes and imaging from previsou exam, outside providers and external EMR if available.   As well as notes that were available from care everywhere and other healthcare systems.  Past medical history, social, surgical and family history all reviewed in electronic medical record.  Priscilla [name] information unless stated regarding to the chief complaint.   Past Medical History:  Diagnosis Date   Abnormal Pap smear    Hx of varicella     Priscilla [name]   Review of Systems:  Priscilla [name], visual changes, nausea, vomiting, diarrhea, constipation, dizziness, abdominal pain, skin rash, fevers, chills, night sweats, weight loss, swollen lymph nodes, body aches, joint swelling, chest pain, shortness of breath, mood changes. POSITIVE muscle aches  Objective  Blood pressure 122/84, pulse 72, height '5\' 2"'$  (1.575 m), weight 163 lb (73.9 kg), SpO2 98 %.   General: Priscilla [name] alert and oriented x3 mood and affect normal, dressed appropriately.  HEENT: Pupils equal, extraocular movements intact  Respiratory: Patient's speak in full sentences and does not appear short of breath  Cardiovascular: Priscilla [name] lower extremity edema, non tender, Priscilla [name] erythema  Low back exam does have some loss lordosis.  Patient does have some tightness noted in the right side of the neck.  Osteopathic findings  C2 flexed rotated and side bent right C7  flexed rotated and side bent right T3 extended rotated and side bent right inhaled rib L2 flexed rotated and side bent right Sacrum right on right       Assessment and Plan:  SI (sacroiliac) joint dysfunction Some tightness still noted at this time.  Discussed icing regimen and home exercises, increase activity slowly.  Follow-up again in 6 to 8 weeks.  Has been doing well with the conservative therapy at this time    Nonallopathic problems  Decision today to treat with OMT was based on Physical Exam  After verbal consent patient was treated with HVLA, ME, FPR techniques in cervical, rib, thoracic, lumbar, and sacral  areas  Patient tolerated the procedure well with improvement in symptoms  Patient given exercises, stretches and lifestyle modifications  See medications in patient instructions if given  Patient will follow up in 4-8 weeks    The above documentation has been reviewed and is accurate and complete Priscilla Pulley, DO          Note: This dictation was prepared with Dragon dictation along with smaller phrase technology. Any transcriptional errors that result from this process are unintentional.

## 2022-08-30 ENCOUNTER — Other Ambulatory Visit (HOSPITAL_BASED_OUTPATIENT_CLINIC_OR_DEPARTMENT_OTHER): Payer: Self-pay

## 2022-08-30 ENCOUNTER — Ambulatory Visit: Payer: 59 | Admitting: Family Medicine

## 2022-08-30 VITALS — BP 122/84 | HR 72 | Ht 62.0 in | Wt 163.0 lb

## 2022-08-30 DIAGNOSIS — M9901 Segmental and somatic dysfunction of cervical region: Secondary | ICD-10-CM

## 2022-08-30 DIAGNOSIS — M9903 Segmental and somatic dysfunction of lumbar region: Secondary | ICD-10-CM | POA: Diagnosis not present

## 2022-08-30 DIAGNOSIS — M9904 Segmental and somatic dysfunction of sacral region: Secondary | ICD-10-CM

## 2022-08-30 DIAGNOSIS — M9908 Segmental and somatic dysfunction of rib cage: Secondary | ICD-10-CM

## 2022-08-30 DIAGNOSIS — M9902 Segmental and somatic dysfunction of thoracic region: Secondary | ICD-10-CM | POA: Diagnosis not present

## 2022-08-30 DIAGNOSIS — M533 Sacrococcygeal disorders, not elsewhere classified: Secondary | ICD-10-CM

## 2022-08-30 NOTE — Assessment & Plan Note (Signed)
Some tightness still noted at this time.  Discussed icing regimen and home exercises, increase activity slowly.  Follow-up again in 6 to 8 weeks.  Has been doing well with the conservative therapy at this time

## 2022-08-30 NOTE — Patient Instructions (Signed)
Good to see you   

## 2022-09-15 ENCOUNTER — Other Ambulatory Visit (HOSPITAL_BASED_OUTPATIENT_CLINIC_OR_DEPARTMENT_OTHER): Payer: Self-pay

## 2022-09-15 ENCOUNTER — Other Ambulatory Visit: Payer: Self-pay | Admitting: Family Medicine

## 2022-09-15 MED ORDER — GABAPENTIN 300 MG PO CAPS
300.0000 mg | ORAL_CAPSULE | Freq: Every day | ORAL | 1 refills | Status: DC
  Filled 2022-09-15: qty 30, 30d supply, fill #0
  Filled 2022-11-02: qty 30, 30d supply, fill #1

## 2022-09-20 DIAGNOSIS — D2261 Melanocytic nevi of right upper limb, including shoulder: Secondary | ICD-10-CM | POA: Diagnosis not present

## 2022-09-20 DIAGNOSIS — D2272 Melanocytic nevi of left lower limb, including hip: Secondary | ICD-10-CM | POA: Diagnosis not present

## 2022-09-20 DIAGNOSIS — D2262 Melanocytic nevi of left upper limb, including shoulder: Secondary | ICD-10-CM | POA: Diagnosis not present

## 2022-09-20 DIAGNOSIS — D225 Melanocytic nevi of trunk: Secondary | ICD-10-CM | POA: Diagnosis not present

## 2022-09-20 DIAGNOSIS — D1801 Hemangioma of skin and subcutaneous tissue: Secondary | ICD-10-CM | POA: Diagnosis not present

## 2022-09-20 DIAGNOSIS — L814 Other melanin hyperpigmentation: Secondary | ICD-10-CM | POA: Diagnosis not present

## 2022-09-20 DIAGNOSIS — D2271 Melanocytic nevi of right lower limb, including hip: Secondary | ICD-10-CM | POA: Diagnosis not present

## 2022-09-20 DIAGNOSIS — D224 Melanocytic nevi of scalp and neck: Secondary | ICD-10-CM | POA: Diagnosis not present

## 2022-09-26 ENCOUNTER — Other Ambulatory Visit (HOSPITAL_BASED_OUTPATIENT_CLINIC_OR_DEPARTMENT_OTHER): Payer: Self-pay

## 2022-09-26 ENCOUNTER — Telehealth: Payer: 59 | Admitting: Physician Assistant

## 2022-09-26 DIAGNOSIS — R3989 Other symptoms and signs involving the genitourinary system: Secondary | ICD-10-CM

## 2022-09-26 MED ORDER — CEPHALEXIN 500 MG PO CAPS
500.0000 mg | ORAL_CAPSULE | Freq: Two times a day (BID) | ORAL | 0 refills | Status: AC
  Filled 2022-09-26: qty 14, 7d supply, fill #0

## 2022-09-26 NOTE — Progress Notes (Signed)

## 2022-10-03 ENCOUNTER — Encounter: Payer: 59 | Admitting: Surgical

## 2022-10-24 ENCOUNTER — Ambulatory Visit (INDEPENDENT_AMBULATORY_CARE_PROVIDER_SITE_OTHER): Payer: Self-pay | Admitting: Surgical

## 2022-10-24 DIAGNOSIS — Z411 Encounter for cosmetic surgery: Secondary | ICD-10-CM

## 2022-10-24 NOTE — Progress Notes (Signed)
Botulinum Toxin Procedure Note  Procedure: Cosmetic botulinum toxin  Pre-operative Diagnosis: Dynamic rhytides  Post-operative Diagnosis: Same  Complications:  None  Brief history: The patient desires botulinum toxin injection.  She is aware of the risks including bleeding, damage to deeper structures, asymmetry, brow ptosis, eyelid ptosis, bruising. The patient understands and wishes to proceed.  Procedure: The area was prepped with alcohol and dried with a clean gauze.  Using a clean technique the botulinum toxin was diluted with 2.5 mL of bacteriostatic saline per 100 unit vial which resulted in 4 units per 0.1 mL.  Subsequently the mixture was injected in the glabellar, lateral canthal lines, forehead area with preservation of the temporal branch to the lateral eyebrow. A total of 26 Units of botulinum toxin was used. The forehead and glabellar area was injected with care to inject intramuscular only while holding pressure on the supratrochlear vessels in each area during each injection on either side of the medial corrugators. The injection proceeded vertically superiorly to the medial 2/3 of the frontalis muscle and superior 2/3 of the lateral frontalis, again with preservation of the frontal branch.  No complications were noted. Light pressure was held for 5 minutes. She was instructed explicitly in post-operative care.  The forehead was injected in a standard one line pattern 2 injections in the lateral canthus, one being just lateral and one inferior to this. Glabella was injected in a standard pattern  Botox LOT:  [id number] EXP:  2026/03

## 2022-10-25 NOTE — Progress Notes (Unsigned)
  Priscilla [name] [address] [place] Phone: [phone] Subjective:   Priscilla [name], am serving as a scribe for Dr. Hulan Saas.  I'm seeing this patient by the request  of:  Janith Lima, MD  CC: neck and back pain   [id number]  Priscilla [name] is a 37 y.o. female coming in with complaint of back and neck pain. OMT on 08/30/2022. Patient states does have some tightness but nothing severe.  Nothing that stopping patient from any type of daily activities.  Medications patient has been prescribed:   Taking:         Reviewed prior external information including notes and imaging from previsou exam, outside providers and external EMR if available.   As well as notes that were available from care everywhere and other healthcare systems.  Past medical history, social, surgical and family history all reviewed in electronic medical record.  No pertanent information unless stated regarding to the chief complaint.   Past Medical History:  Diagnosis Date   Abnormal Pap smear    Hx of varicella     No Known Allergies   Review of Systems:  No headache, visual changes, nausea, vomiting, diarrhea, constipation, dizziness, abdominal pain, skin rash, fevers, chills, night sweats, weight loss, swollen lymph nodes, body aches, joint swelling, chest pain, shortness of breath, mood changes. POSITIVE muscle aches  Objective  Blood pressure 122/76, pulse 78, height 5\' 2"  (1.575 m), SpO2 96 %.   General: No apparent distress alert and oriented x3 mood and affect normal, dressed appropriately.  HEENT: Pupils equal, extraocular movements intact  Respiratory: Patient's speak in full sentences and does not appear short of breath  Cardiovascular: No lower extremity edema, non tender, no erythema  Gait MSK:  Back   Osteopathic findings  C2 flexed rotated and side bent right C6 flexed rotated and side bent left T3 extended rotated and side  bent right inhaled rib L2 flexed rotated and side bent right L4 flexed rotated and side Sacrum right on right       Assessment and Plan:  Cervical radiculopathy at C8 Neck exam shows mild loss of lordosis patient does have tightness noted with neck overall but does respond extremely well to osteopathic manipulation.  Discussed with patient about posture and ergonomics otherwise.  Follow-up with me again in 6 to 8 weeks    Nonallopathic problems  Decision today to treat with OMT was based on Physical Exam  After verbal consent patient was treated with HVLA, ME, FPR techniques in cervical, rib, thoracic, lumbar, and sacral  areas  Patient tolerated the procedure well with improvement in symptoms  Patient given exercises, stretches and lifestyle modifications  See medications in patient instructions if given  Patient will follow up in 8 weeks     The above documentation has been reviewed and is accurate and complete Lyndal Pulley, DO         Note: This dictation was prepared with Dragon dictation along with smaller phrase technology. Any transcriptional errors that result from this process are unintentional.

## 2022-11-02 ENCOUNTER — Ambulatory Visit: Payer: 59 | Admitting: Family Medicine

## 2022-11-02 ENCOUNTER — Other Ambulatory Visit (HOSPITAL_BASED_OUTPATIENT_CLINIC_OR_DEPARTMENT_OTHER): Payer: Self-pay

## 2022-11-02 ENCOUNTER — Encounter: Payer: Self-pay | Admitting: Family Medicine

## 2022-11-02 ENCOUNTER — Other Ambulatory Visit: Payer: Self-pay

## 2022-11-02 ENCOUNTER — Other Ambulatory Visit: Payer: Self-pay | Admitting: Internal Medicine

## 2022-11-02 VITALS — BP 122/76 | HR 78 | Ht 62.0 in

## 2022-11-02 DIAGNOSIS — M5412 Radiculopathy, cervical region: Secondary | ICD-10-CM

## 2022-11-02 DIAGNOSIS — M9903 Segmental and somatic dysfunction of lumbar region: Secondary | ICD-10-CM | POA: Diagnosis not present

## 2022-11-02 DIAGNOSIS — M9901 Segmental and somatic dysfunction of cervical region: Secondary | ICD-10-CM

## 2022-11-02 DIAGNOSIS — M9908 Segmental and somatic dysfunction of rib cage: Secondary | ICD-10-CM | POA: Diagnosis not present

## 2022-11-02 DIAGNOSIS — M9902 Segmental and somatic dysfunction of thoracic region: Secondary | ICD-10-CM | POA: Diagnosis not present

## 2022-11-02 DIAGNOSIS — F411 Generalized anxiety disorder: Secondary | ICD-10-CM

## 2022-11-02 DIAGNOSIS — M9904 Segmental and somatic dysfunction of sacral region: Secondary | ICD-10-CM | POA: Diagnosis not present

## 2022-11-02 MED ORDER — ALPRAZOLAM 0.25 MG PO TABS
0.2500 mg | ORAL_TABLET | Freq: Two times a day (BID) | ORAL | 1 refills | Status: AC | PRN
  Filled 2022-11-02: qty 60, 30d supply, fill #0

## 2022-11-02 NOTE — Assessment & Plan Note (Signed)
Neck exam shows mild loss of lordosis patient does have tightness noted with neck overall but does respond extremely well to osteopathic manipulation.  Discussed with patient about posture and ergonomics otherwise.  Follow-up with me again in 6 to 8 weeks

## 2022-11-04 ENCOUNTER — Other Ambulatory Visit (HOSPITAL_BASED_OUTPATIENT_CLINIC_OR_DEPARTMENT_OTHER): Payer: Self-pay

## 2022-11-04 ENCOUNTER — Other Ambulatory Visit: Payer: Self-pay | Admitting: Internal Medicine

## 2022-11-04 DIAGNOSIS — B001 Herpesviral vesicular dermatitis: Secondary | ICD-10-CM

## 2022-11-04 MED ORDER — VALACYCLOVIR HCL 1 G PO TABS
2000.0000 mg | ORAL_TABLET | Freq: Two times a day (BID) | ORAL | 1 refills | Status: DC
  Filled 2022-11-04: qty 4, 1d supply, fill #0
  Filled 2022-11-25: qty 4, 1d supply, fill #1

## 2022-11-25 ENCOUNTER — Other Ambulatory Visit: Payer: Self-pay | Admitting: Family Medicine

## 2022-11-28 ENCOUNTER — Other Ambulatory Visit (HOSPITAL_BASED_OUTPATIENT_CLINIC_OR_DEPARTMENT_OTHER): Payer: Self-pay

## 2022-11-28 ENCOUNTER — Other Ambulatory Visit: Payer: Self-pay

## 2022-11-28 MED ORDER — GABAPENTIN 300 MG PO CAPS
300.0000 mg | ORAL_CAPSULE | Freq: Every day | ORAL | 1 refills | Status: DC
  Filled 2022-11-28: qty 30, 30d supply, fill #0
  Filled 2022-12-27: qty 30, 30d supply, fill #1

## 2022-12-27 NOTE — Progress Notes (Unsigned)
  [name] [address] [place] [address] Phone: [phone] Subjective:    I'm seeing this patient by the request  of:  [name], MD  CC: Back and neck pain follow-up  [id number]  Priscilla [name] is a 37 y.o. female coming in with complaint of back and neck pain. OMT on 11/02/2022. Patient states just a full body adjustment   Medications patient has been prescribed: gabapentin  Taking:         Reviewed prior external information including notes and imaging from previsou exam, outside providers and external EMR if available.   As well as notes that were available from care everywhere and other healthcare systems.  Past medical history, social, surgical and family history all reviewed in electronic medical record.  No pertanent information unless stated regarding to the chief complaint.   Past Medical History:  Diagnosis Date   Abnormal Pap smear    Hx of varicella     No Known Allergies   Review of Systems:  No headache, visual changes, nausea, vomiting, diarrhea, constipation, dizziness, abdominal pain, skin rash, fevers, chills, night sweats, weight loss, swollen lymph nodes, body aches, joint swelling, chest pain, shortness of breath, mood changes. POSITIVE muscle aches  Objective  Blood pressure 110/78, pulse 70, height 5\' 2"  (1.575 m), weight 163 lb (73.9 kg), SpO2 99 %.   General: No apparent distress alert and oriented x3 mood and affect normal, dressed appropriately.  HEENT: Pupils equal, extraocular movements intact  Respiratory: Patient's speak in full sentences and does not appear short of breath  Cardiovascular: No lower extremity edema, non tender, no erythema  Low back exam does have some loss of lordosis noted.  Tightness noted in the paraspinal musculature right greater than left.  Neck exam does have some limited sidebending bilaterally.  Osteopathic findings  C2 flexed rotated and side bent right C6  flexed rotated and side bent left T3 extended rotated and side bent right inhaled rib T8 extended rotated and side bent left L5 flexed rotated and side bent left  Sacrum right on right       Assessment and Plan:  SI (sacroiliac) joint dysfunction Patient is doing well at this time.  Discussed icing regimen and home exercises, discussed which activities to do and which ones to move increase activity slowly over the course neck several weeks.  Follow-up again in 8 weeks    Nonallopathic problems  Decision today to treat with OMT was based on Physical Exam  After verbal consent patient was treated with HVLA, ME, FPR techniques in cervical, rib, thoracic, lumbar, and sacral  areas  Patient tolerated the procedure well with improvement in symptoms  Patient given exercises, stretches and lifestyle modifications  See medications in patient instructions if given  Patient will follow up in 4-8 weeks    The above documentation has been reviewed and is accurate and complete Judi Saa, DO          Note: This dictation was prepared with Dragon dictation along with smaller phrase technology. Any transcriptional errors that result from this process are unintentional.

## 2022-12-28 ENCOUNTER — Ambulatory Visit (INDEPENDENT_AMBULATORY_CARE_PROVIDER_SITE_OTHER): Payer: 59 | Admitting: Family Medicine

## 2022-12-28 VITALS — BP 110/78 | HR 70 | Ht 62.0 in | Wt 163.0 lb

## 2022-12-28 DIAGNOSIS — M9901 Segmental and somatic dysfunction of cervical region: Secondary | ICD-10-CM | POA: Diagnosis not present

## 2022-12-28 DIAGNOSIS — M9904 Segmental and somatic dysfunction of sacral region: Secondary | ICD-10-CM

## 2022-12-28 DIAGNOSIS — M9903 Segmental and somatic dysfunction of lumbar region: Secondary | ICD-10-CM | POA: Diagnosis not present

## 2022-12-28 DIAGNOSIS — M9908 Segmental and somatic dysfunction of rib cage: Secondary | ICD-10-CM | POA: Diagnosis not present

## 2022-12-28 DIAGNOSIS — M533 Sacrococcygeal disorders, not elsewhere classified: Secondary | ICD-10-CM | POA: Diagnosis not present

## 2022-12-28 DIAGNOSIS — M9902 Segmental and somatic dysfunction of thoracic region: Secondary | ICD-10-CM | POA: Diagnosis not present

## 2022-12-28 NOTE — Assessment & Plan Note (Signed)
Patient is doing well at this time.  Discussed icing regimen and home exercises, discussed which activities to do and which ones to move increase activity slowly over the course neck several weeks.  Follow-up again in 8 weeks

## 2023-01-25 ENCOUNTER — Other Ambulatory Visit: Payer: Self-pay | Admitting: Family Medicine

## 2023-01-25 ENCOUNTER — Other Ambulatory Visit (HOSPITAL_BASED_OUTPATIENT_CLINIC_OR_DEPARTMENT_OTHER): Payer: Self-pay

## 2023-01-25 MED ORDER — GABAPENTIN 300 MG PO CAPS
300.0000 mg | ORAL_CAPSULE | Freq: Every day | ORAL | 1 refills | Status: DC
  Filled 2023-01-25: qty 30, 30d supply, fill #0
  Filled 2023-02-22: qty 30, 30d supply, fill #1

## 2023-01-26 ENCOUNTER — Ambulatory Visit (INDEPENDENT_AMBULATORY_CARE_PROVIDER_SITE_OTHER): Payer: Self-pay | Admitting: Surgical

## 2023-01-26 DIAGNOSIS — Z411 Encounter for cosmetic surgery: Secondary | ICD-10-CM

## 2023-01-26 NOTE — Progress Notes (Signed)
Botulinum Toxin Procedure Note  Procedure: Cosmetic botulinum toxin  Pre-operative Diagnosis: Dynamic rhytides  Post-operative Diagnosis: Same  Complications:  None  Brief history: The patient desires botulinum toxin injection.  She is aware of the risks including bleeding, damage to deeper structures, asymmetry, brow ptosis, eyelid ptosis, bruising. The patient understands and wishes to proceed.  Procedure: The area was prepped with alcohol and dried with a clean gauze.  Using a clean technique the botulinum toxin was diluted with 2.5 mL of bacteriostatic saline per 100 unit vial which resulted in 4 units per 0.1 mL.  Subsequently the mixture was injected in the glabellar, lateral canthal lines, forehead area with preservation of the temporal branch to the lateral eyebrow. A total of 28 Units of botulinum toxin was used. The forehead and glabellar area was injected with care to inject intramuscular only while holding pressure on the supratrochlear vessels in each area during each injection on either side of the medial corrugators. The injection proceeded vertically superiorly to the medial 2/3 of the frontalis muscle and superior 2/3 of the lateral frontalis, again with preservation of the frontal branch.  No complications were noted. Light pressure was held for 5 minutes. She was instructed explicitly in post-operative care.  Botox LOT:  [id number] EXP:  41324

## 2023-02-21 NOTE — Progress Notes (Deleted)
  [name] [address] [place] [address] Phone: [phone] Subjective:    I'm seeing this patient by the request  of:  [name], MD  CC: back and neck pain follow up   [id number]  Priscilla [name] is a 37 y.o. female coming in with complaint of back and neck pain. OMT on 12/28/2022. Patient states   Medications patient has been prescribed: gabapentin  Taking:         Reviewed prior external information including notes and imaging from previsou exam, outside providers and external EMR if available.   As well as notes that were available from care everywhere and other healthcare systems.  Past medical history, social, surgical and family history all reviewed in electronic medical record.  No pertanent information unless stated regarding to the chief complaint.   Past Medical History:  Diagnosis Date   Abnormal Pap smear    Hx of varicella     No Known Allergies   Review of Systems:  No headache, visual changes, nausea, vomiting, diarrhea, constipation, dizziness, abdominal pain, skin rash, fevers, chills, night sweats, weight loss, swollen lymph nodes, body aches, joint swelling, chest pain, shortness of breath, mood changes. POSITIVE muscle aches  Objective  There were no vitals taken for this visit.   General: No apparent distress alert and oriented x3 mood and affect normal, dressed appropriately.  HEENT: Pupils equal, extraocular movements intact  Respiratory: Patient's speak in full sentences and does not appear short of breath  Cardiovascular: No lower extremity edema, non tender, no erythema  Gait MSK:  Back   Osteopathic findings  C2 flexed rotated and side bent right C6 flexed rotated and side bent left T3 extended rotated and side bent right inhaled rib T9 extended rotated and side bent left L2 flexed rotated and side bent right Sacrum right on right       Assessment and Plan:  No  problem-specific Assessment & Plan notes found for this encounter.    Nonallopathic problems  Decision today to treat with OMT was based on Physical Exam  After verbal consent patient was treated with HVLA, ME, FPR techniques in cervical, rib, thoracic, lumbar, and sacral  areas  Patient tolerated the procedure well with improvement in symptoms  Patient given exercises, stretches and lifestyle modifications  See medications in patient instructions if given  Patient will follow up in 4-8 weeks     The above documentation has been reviewed and is accurate and complete Judi Saa, DO         Note: This dictation was prepared with Dragon dictation along with smaller phrase technology. Any transcriptional errors that result from this process are unintentional.

## 2023-02-22 ENCOUNTER — Other Ambulatory Visit (HOSPITAL_BASED_OUTPATIENT_CLINIC_OR_DEPARTMENT_OTHER): Payer: Self-pay

## 2023-02-22 ENCOUNTER — Other Ambulatory Visit: Payer: Self-pay | Admitting: Internal Medicine

## 2023-02-22 ENCOUNTER — Other Ambulatory Visit: Payer: Self-pay

## 2023-02-22 DIAGNOSIS — B001 Herpesviral vesicular dermatitis: Secondary | ICD-10-CM

## 2023-02-22 MED ORDER — VALACYCLOVIR HCL 1 G PO TABS
2000.0000 mg | ORAL_TABLET | Freq: Two times a day (BID) | ORAL | 1 refills | Status: AC
  Filled 2023-02-22: qty 4, 1d supply, fill #0
  Filled 2023-04-05: qty 4, 1d supply, fill #1

## 2023-02-27 ENCOUNTER — Ambulatory Visit: Payer: 59 | Admitting: Family Medicine

## 2023-02-28 NOTE — Progress Notes (Unsigned)
  [name] [address] [place] [address] Phone: [phone] Subjective:   Priscilla [name], am serving as a scribe for Dr. Antoine Primas.  I'm seeing this patient by the request  of:  [name], MD  CC: Neck and back pain  [id number]  Priscilla [name] is a 37 y.o. female coming in with complaint of back and neck pain. OMT 12/28/2022. Patient states that overall does have some tightness noted.  Medications patient has been prescribed: Gabapentin  Taking: Yes         Reviewed prior external information including notes and imaging from previsou exam, outside providers and external EMR if available.   As well as notes that were available from care everywhere and other healthcare systems.  Past medical history, social, surgical and family history all reviewed in electronic medical record.  No pertanent information unless stated regarding to the chief complaint.   Past Medical History:  Diagnosis Date   Abnormal Pap smear    Hx of varicella     No Known Allergies   Review of Systems:  No headache, visual changes, nausea, vomiting, diarrhea, constipation, dizziness, abdominal pain, skin rash, fevers, chills, night sweats, weight loss, swollen lymph nodes, body aches, joint swelling, chest pain, shortness of breath, mood changes. POSITIVE muscle aches  Objective  Blood pressure 116/84, pulse 74, height 5\' 2"  (1.575 m), SpO2 98%.   General: No apparent distress alert and oriented x3 mood and affect normal, dressed appropriately.  HEENT: Pupils equal, extraocular movements intact  Respiratory: Patient's speak in full sentences and does not appear short of breath  Cardiovascular: No lower extremity edema, non tender, no erythema  Low back exam does have some mild loss of lordosis noted.  A little more than usual actually.  Patient though does have tightness noted with FABER test bilaterally.  Limited range of motion  Osteopathic  findings  C2 flexed rotated and side bent right C7 flexed rotated and side bent right  T3 extended rotated and side bent right inhaled rib T7 extended rotated and side bent left L1 flexed rotated and side bent right Sacrum right on right    Assessment and Plan:  SI (sacroiliac) joint dysfunction Chronic, some mild tightness.  Does still have the cervical radiculopathy we will continue to monitor as well that seems to be intermittent.  Patient has done well with her strength and will continue to watch ergonomics at work will follow-up with me again in 2 months for further evaluation and treatment    Nonallopathic problems  Decision today to treat with OMT was based on Physical Exam  After verbal consent patient was treated with HVLA, ME, FPR techniques in cervical, rib, thoracic, lumbar, and sacral  areas  Patient tolerated the procedure well with improvement in symptoms  Patient given exercises, stretches and lifestyle modifications  See medications in patient instructions if given  Patient will follow up in 4-8 weeks    The above documentation has been reviewed and is accurate and complete Judi Saa, DO          Note: This dictation was prepared with Dragon dictation along with smaller phrase technology. Any transcriptional errors that result from this process are unintentional.

## 2023-03-01 ENCOUNTER — Encounter: Payer: Self-pay | Admitting: Family Medicine

## 2023-03-01 ENCOUNTER — Ambulatory Visit: Payer: 59 | Admitting: Family Medicine

## 2023-03-01 VITALS — BP 116/84 | HR 74 | Ht 62.0 in

## 2023-03-01 DIAGNOSIS — M9903 Segmental and somatic dysfunction of lumbar region: Secondary | ICD-10-CM

## 2023-03-01 DIAGNOSIS — M5412 Radiculopathy, cervical region: Secondary | ICD-10-CM | POA: Diagnosis not present

## 2023-03-01 DIAGNOSIS — M9908 Segmental and somatic dysfunction of rib cage: Secondary | ICD-10-CM | POA: Diagnosis not present

## 2023-03-01 DIAGNOSIS — M9902 Segmental and somatic dysfunction of thoracic region: Secondary | ICD-10-CM

## 2023-03-01 DIAGNOSIS — M533 Sacrococcygeal disorders, not elsewhere classified: Secondary | ICD-10-CM

## 2023-03-01 DIAGNOSIS — M9904 Segmental and somatic dysfunction of sacral region: Secondary | ICD-10-CM | POA: Diagnosis not present

## 2023-03-01 DIAGNOSIS — M9901 Segmental and somatic dysfunction of cervical region: Secondary | ICD-10-CM | POA: Diagnosis not present

## 2023-03-01 NOTE — Assessment & Plan Note (Signed)
Patient does have some tightness in the neck noted.  Discussed icing regimen and home exercises, discussed which activities to do and which ones to avoid.  Follow-up with me again in 6 to 8 weeks.

## 2023-03-01 NOTE — Assessment & Plan Note (Signed)
Chronic, some mild tightness.  Does still have the cervical radiculopathy we will continue to monitor as well that seems to be intermittent.  Patient has done well with her strength and will continue to watch ergonomics at work will follow-up with me again in 2 months for further evaluation and treatment

## 2023-03-02 ENCOUNTER — Other Ambulatory Visit (HOSPITAL_BASED_OUTPATIENT_CLINIC_OR_DEPARTMENT_OTHER): Payer: Self-pay

## 2023-04-05 ENCOUNTER — Other Ambulatory Visit (HOSPITAL_BASED_OUTPATIENT_CLINIC_OR_DEPARTMENT_OTHER): Payer: Self-pay

## 2023-04-05 ENCOUNTER — Other Ambulatory Visit: Payer: Self-pay

## 2023-04-05 ENCOUNTER — Other Ambulatory Visit: Payer: Self-pay | Admitting: Family Medicine

## 2023-04-05 MED ORDER — GABAPENTIN 300 MG PO CAPS
300.0000 mg | ORAL_CAPSULE | Freq: Every day | ORAL | 1 refills | Status: DC
  Filled 2023-04-05: qty 30, 30d supply, fill #0
  Filled 2023-05-03: qty 30, 30d supply, fill #1

## 2023-04-09 ENCOUNTER — Telehealth: Payer: 59 | Admitting: Physician Assistant

## 2023-04-09 DIAGNOSIS — R399 Unspecified symptoms and signs involving the genitourinary system: Secondary | ICD-10-CM

## 2023-04-09 MED ORDER — NITROFURANTOIN MONOHYD MACRO 100 MG PO CAPS: 100.0000 mg | ORAL_CAPSULE | Freq: Two times a day (BID) | ORAL | 0 refills | Status: AC

## 2023-04-09 NOTE — Progress Notes (Signed)
E-Visit for Urinary Problems  We are sorry that you are not feeling well.  Here is how we plan to help!  Based on what you shared with me it looks like you most likely have a simple urinary tract infection.  A UTI (Urinary Tract Infection) is a bacterial infection of the bladder.  Most cases of urinary tract infections are simple to treat but a key part of your care is to encourage you to drink plenty of fluids and watch your symptoms carefully.  I have prescribed MacroBid 100 mg twice a day for 5 days.  Your symptoms should gradually improve. Call us if the burning in your urine worsens, you develop worsening fever, back pain or pelvic pain or if your symptoms do not resolve after completing the antibiotic.  Urinary tract infections can be prevented by drinking plenty of water to keep your body hydrated.  Also be sure when you wipe, wipe from front to back and don't hold it in!  If possible, empty your bladder every 4 hours.  HOME CARE Drink plenty of fluids Compete the full course of the antibiotics even if the symptoms resolve Remember, when you need to go.go. Holding in your urine can increase the likelihood of getting a UTI! GET HELP RIGHT AWAY IF: You cannot urinate You get a high fever Worsening back pain occurs You see blood in your urine You feel sick to your stomach or throw up You feel like you are going to pass out  MAKE SURE YOU  Understand these instructions. Will watch your condition. Will get help right away if you are not doing well or get worse.   Thank you for choosing an e-visit.  Your e-visit answers were reviewed by a board certified advanced clinical practitioner to complete your personal care plan. Depending upon the condition, your plan could have included both over the counter or prescription medications.  Please review your pharmacy choice. Make sure the pharmacy is open so you can pick up prescription now. If there is a problem, you may contact your  provider through MyChart messaging and have the prescription routed to another pharmacy.  Your safety is important to us. If you have drug allergies check your prescription carefully.   For the next 24 hours you can use MyChart to ask questions about today's visit, request a non-urgent call back, or ask for a work or school excuse. You will get an email in the next two days asking about your experience. I hope that your e-visit has been valuable and will speed your recovery.   I have spent 5 minutes in review of e-visit questionnaire, review and updating patient chart, medical decision making and response to patient.   Jessica Z Ward, PA-C    

## 2023-04-20 NOTE — Progress Notes (Unsigned)
  [name] [address] [place] [address] Phone: [phone] Subjective:   Priscilla [name], am serving as a scribe for Dr. Antoine Primas.  I'm seeing this patient by the request  of:  [name], MD  CC: back and neck pain   [id number]  Priscilla [name] is a 37 y.o. female coming in with complaint of back and neck pain. OMT on 03/01/2023. Patient states doing well discussed which activities to do and which ones to avoid.  Increasing activity slowly and has not noticed any significant worsening pain.  Medications patient has been prescribed: gabapentin  Taking:         Reviewed prior external information including notes and imaging from previsou exam, outside providers and external EMR if available.   As well as notes that were available from care everywhere and other healthcare systems.  Past medical history, social, surgical and family history all reviewed in electronic medical record.  No pertanent information unless stated regarding to the chief complaint.   Past Medical History:  Diagnosis Date   Abnormal Pap smear    Hx of varicella     No Known Allergies   Review of Systems:  No headache, visual changes, nausea, vomiting, diarrhea, constipation, dizziness, abdominal pain, skin rash, fevers, chills, night sweats, weight loss, swollen lymph nodes, body aches, joint swelling, chest pain, shortness of breath, mood changes. POSITIVE muscle aches  Objective  Blood pressure 124/88, height 5\' 2"  (1.575 m).   General: No apparent distress alert and oriented x3 mood and affect normal, dressed appropriately.  HEENT: Pupils equal, extraocular movements intact  Respiratory: Patient's speak in full sentences and does not appear short of breath  Cardiovascular: No lower extremity edema, non tender, no erythema  Gait MSK:  Back   Osteopathic findings  C2 flexed rotated and side bent right C6 flexed rotated and side bent  left T3 extended rotated and side bent right inhaled rib T9 extended rotated and side bent left L2 flexed rotated and side bent right Sacrum right on right       Assessment and Plan:  SI (sacroiliac) joint dysfunction The patient does have some degenerative disc disease.  Discussed icing regimen and home exercises done.  Will continue to work on core strengthening.  Increase activity as tolerated.  No change in medications.  Follow-up again in 6 to 8 weeks otherwise.    Nonallopathic problems  Decision today to treat with OMT was based on Physical Exam  After verbal consent patient was treated with HVLA, ME, FPR techniques in cervical, rib, thoracic, lumbar, and sacral  areas  Patient tolerated the procedure well with improvement in symptoms  Patient given exercises, stretches and lifestyle modifications  See medications in patient instructions if given  Patient will follow up in 4-8 weeks     The above documentation has been reviewed and is accurate and complete Judi Saa, DO         Note: This dictation was prepared with Dragon dictation along with smaller phrase technology. Any transcriptional errors that result from this process are unintentional.

## 2023-04-24 ENCOUNTER — Ambulatory Visit: Payer: 59 | Admitting: Family Medicine

## 2023-04-24 VITALS — BP 124/88 | Ht 62.0 in

## 2023-04-24 DIAGNOSIS — M9908 Segmental and somatic dysfunction of rib cage: Secondary | ICD-10-CM

## 2023-04-24 DIAGNOSIS — M9903 Segmental and somatic dysfunction of lumbar region: Secondary | ICD-10-CM | POA: Diagnosis not present

## 2023-04-24 DIAGNOSIS — M9901 Segmental and somatic dysfunction of cervical region: Secondary | ICD-10-CM | POA: Diagnosis not present

## 2023-04-24 DIAGNOSIS — M533 Sacrococcygeal disorders, not elsewhere classified: Secondary | ICD-10-CM

## 2023-04-24 DIAGNOSIS — M9904 Segmental and somatic dysfunction of sacral region: Secondary | ICD-10-CM | POA: Diagnosis not present

## 2023-04-24 DIAGNOSIS — M9902 Segmental and somatic dysfunction of thoracic region: Secondary | ICD-10-CM | POA: Diagnosis not present

## 2023-04-24 NOTE — Assessment & Plan Note (Signed)
The patient does have some degenerative disc disease.  Discussed icing regimen and home exercises done.  Will continue to work on core strengthening.  Increase activity as tolerated.  No change in medications.  Follow-up again in 6 to 8 weeks otherwise.

## 2023-04-25 ENCOUNTER — Encounter: Payer: Self-pay | Admitting: Family Medicine

## 2023-05-03 ENCOUNTER — Ambulatory Visit (INDEPENDENT_AMBULATORY_CARE_PROVIDER_SITE_OTHER): Payer: Self-pay | Admitting: Surgical

## 2023-05-03 DIAGNOSIS — Z411 Encounter for cosmetic surgery: Secondary | ICD-10-CM

## 2023-05-03 NOTE — Progress Notes (Signed)
Botulinum Toxin Procedure Note  Procedure: Cosmetic botulinum toxin  Pre-operative Diagnosis: Dynamic rhytides  Post-operative Diagnosis: Same  Complications:  None  Brief history: The patient desires botulinum toxin injection.  She is aware of the risks including bleeding, damage to deeper structures, asymmetry, brow ptosis, eyelid ptosis, bruising. The patient understands and wishes to proceed.  Procedure: The area was prepped with alcohol and dried with a clean gauze.  Using a clean technique the botulinum toxin was diluted with 2.5 mL of bacteriostatic saline per 100 unit vial which resulted in 4 units per 0.1 mL.  Subsequently the mixture was injected in the glabellar, lateral canthal lines, forehead area with preservation of the temporal branch to the lateral eyebrow. A total of 28 Units of botulinum toxin was used. The forehead and glabellar area was injected with care to inject intramuscular only while holding pressure on the supratrochlear vessels in each area during each injection on either side of the medial corrugators. The injection proceeded vertically superiorly to the medial 2/3 of the frontalis muscle and superior 2/3 of the lateral frontalis, again with preservation of the frontal branch.  No complications were noted. Light pressure was held for 5 minutes. She was instructed explicitly in post-operative care.  Botox LOT:  [id number] EXP:  04/2025

## 2023-05-19 ENCOUNTER — Ambulatory Visit: Payer: 59 | Admitting: Family Medicine

## 2023-05-23 ENCOUNTER — Encounter: Payer: Self-pay | Admitting: Internal Medicine

## 2023-05-23 ENCOUNTER — Encounter: Payer: Self-pay | Admitting: Family Medicine

## 2023-05-23 ENCOUNTER — Ambulatory Visit: Payer: 59 | Admitting: Family Medicine

## 2023-05-23 VITALS — BP 120/81 | HR 98 | Temp 98.2°F | Ht 62.0 in | Wt 165.2 lb

## 2023-05-23 DIAGNOSIS — Z23 Encounter for immunization: Secondary | ICD-10-CM

## 2023-05-23 DIAGNOSIS — L918 Other hypertrophic disorders of the skin: Secondary | ICD-10-CM

## 2023-05-23 NOTE — Progress Notes (Signed)
Chief Complaint  Patient presents with   Procedure    Priscilla [name] is a 37 y.o. female here for a skin complaint.  Duration: several years Location: armpits and L elbow Pruritic? Yes Painful? Yes- when they catch on things New soaps/lotions/topicals/detergents? No Other associated symptoms: no changes Therapies tried thus far: none  Past Medical History:  Diagnosis Date   Abnormal Pap smear    Hx of varicella     BP 120/81 (BP Location: Left Arm, Patient Position: Sitting, Cuff Size: Normal)   Pulse 98   Temp 98.2 F (36.8 C) (Oral)   Ht 5\' 2"  (1.575 m)   Wt 165 lb 4 oz (75 kg)   SpO2 94%   BMI 30.22 kg/m  Gen: awake, alert, appearing stated age Lungs: No accessory muscle use Skin: hyperpigmented, fleshy and pedunculated lesion on L axillae, pedunculated and fleshy lesion on R axillae without hyperpigmentation, fleshy flesh-colored and raised lesion over L distal arm medially. No drainage, erythema, TTP, fluctuance, excoriation Psych: Age appropriate judgment and insight  Procedure note; skin tag removal Informed consent obtained. Skin tags were anesthetized with 1% lidocaine with epinephrine after being cleaned with alcohol. The L axillary skin tag was clamped at the base with a straight hemostat. It was then removed with a razor blade. The other two lesions were removed with a razor blade and not clamped. Hemostasis was obtained. There were no complications noted. The patient tolerated the procedure well.  Acrochordon  Need for influenza vaccination  Removal today. Aftercare instructions verbalized and written down. Warning signs and symptoms verbalized and written down in AVS.  F/u prn. The patient voiced understanding and agreement to the plan.  Jilda Roche [place], DO 05/23/23 4:25 PM

## 2023-05-23 NOTE — Patient Instructions (Signed)
Do not shower for the rest of the day. When you do wash it, use only soap and water. Do not vigorously scrub. Apply triple antibiotic ointment (like Neosporin) twice daily. Keep the area clean and dry.   Things to look out for: increasing pain not relieved by ibuprofen/acetaminophen, fevers, spreading redness, drainage of pus, or foul odor.  Let us know if you need anything. 

## 2023-05-23 NOTE — Addendum Note (Signed)
Addended by: Scharlene Gloss B on: 05/23/2023 04:33 PM   Modules accepted: Orders

## 2023-06-08 DIAGNOSIS — Z01419 Encounter for gynecological examination (general) (routine) without abnormal findings: Secondary | ICD-10-CM | POA: Diagnosis not present

## 2023-06-08 DIAGNOSIS — Z6828 Body mass index (BMI) 28.0-28.9, adult: Secondary | ICD-10-CM | POA: Diagnosis not present

## 2023-06-14 NOTE — Progress Notes (Signed)
  [name] [address] [place] [address] Phone: [phone] Subjective:   Priscilla [name], am serving as a scribe for Dr. Antoine Primas.  I'm seeing this patient by the request  of:  [name], MD  CC: back and neck pain follow up   [id number]  Priscilla [name] is a 37 y.o. female coming in with complaint of back and neck pain. OMT on 04/24/2023. Patient states that she is doing well.    Medications patient has been prescribed: Gabapentin  Taking:         Reviewed prior external information including notes and imaging from previsou exam, outside providers and external EMR if available.   As well as notes that were available from care everywhere and other healthcare systems.  Past medical history, social, surgical and family history all reviewed in electronic medical record.  No pertanent information unless stated regarding to the chief complaint.   Past Medical History:  Diagnosis Date   Abnormal Pap smear    Hx of varicella     No Known Allergies   Review of Systems:  No headache, visual changes, nausea, vomiting, diarrhea, constipation, dizziness, abdominal pain, skin rash, fevers, chills, night sweats, weight loss, swollen lymph nodes, body aches, joint swelling, chest pain, shortness of breath, mood changes. POSITIVE muscle aches  Objective  Blood pressure 112/82, height 5\' 2"  (1.575 m).   General: No apparent distress alert and oriented x3 mood and affect normal, dressed appropriately.  HEENT: Pupils equal, extraocular movements intact  Respiratory: Patient's speak in full sentences and does not appear short of breath  Cardiovascular: No lower extremity edema, non tender, no erythema   MSK:  Back does have some mild loss lordosis noted.  Neck exam has some limited the right-sided sidebending noted but overall very minimal compared to where patient can be sometimes.  Osteopathic findings  C3 flexed rotated and  side bent right C7 flexed rotated and side bent left T3 extended rotated and side bent right inhaled rib T7 extended rotated and side bent left L2 flexed rotated and side bent right Sacrum right on right    Assessment and Plan:  Cervical radiculopathy at C8 Patient does have some tenderness to palpation noted.  Has had some radicular radicular symptoms that we will need to continue to monitor.  Patient is doing well with working out at the time.  No other significant changes.  Follow-up again in 2 to 3 months    Nonallopathic problems  Decision today to treat with OMT was based on Physical Exam  After verbal consent patient was treated with HVLA, ME, FPR techniques in cervical, rib, thoracic, lumbar, and sacral  areas  Patient tolerated the procedure well with improvement in symptoms  Patient given exercises, stretches and lifestyle modifications  See medications in patient instructions if given  Patient will follow up in 4-8 weeks    The above documentation has been reviewed and is accurate and complete Judi Saa, DO          Note: This dictation was prepared with Dragon dictation along with smaller phrase technology. Any transcriptional errors that result from this process are unintentional.

## 2023-06-19 ENCOUNTER — Encounter: Payer: Self-pay | Admitting: Family Medicine

## 2023-06-19 ENCOUNTER — Ambulatory Visit: Payer: 59 | Admitting: Family Medicine

## 2023-06-19 VITALS — BP 112/82 | Ht 62.0 in

## 2023-06-19 DIAGNOSIS — M9904 Segmental and somatic dysfunction of sacral region: Secondary | ICD-10-CM | POA: Diagnosis not present

## 2023-06-19 DIAGNOSIS — M5412 Radiculopathy, cervical region: Secondary | ICD-10-CM

## 2023-06-19 DIAGNOSIS — M9901 Segmental and somatic dysfunction of cervical region: Secondary | ICD-10-CM | POA: Diagnosis not present

## 2023-06-19 DIAGNOSIS — M9908 Segmental and somatic dysfunction of rib cage: Secondary | ICD-10-CM | POA: Diagnosis not present

## 2023-06-19 DIAGNOSIS — M9902 Segmental and somatic dysfunction of thoracic region: Secondary | ICD-10-CM

## 2023-06-19 DIAGNOSIS — M9903 Segmental and somatic dysfunction of lumbar region: Secondary | ICD-10-CM

## 2023-06-19 NOTE — Assessment & Plan Note (Signed)
Patient does have some tenderness to palpation noted.  Has had some radicular radicular symptoms that we will need to continue to monitor.  Patient is doing well with working out at the time.  No other significant changes.  Follow-up again in 2 to 3 months

## 2023-06-19 NOTE — Patient Instructions (Signed)
Good to see you See me again in 2 months Happy Holidays!

## 2023-07-04 ENCOUNTER — Other Ambulatory Visit: Payer: Self-pay | Admitting: Family Medicine

## 2023-07-04 ENCOUNTER — Other Ambulatory Visit (HOSPITAL_BASED_OUTPATIENT_CLINIC_OR_DEPARTMENT_OTHER): Payer: Self-pay

## 2023-07-04 MED ORDER — GABAPENTIN 300 MG PO CAPS
300.0000 mg | ORAL_CAPSULE | Freq: Every day | ORAL | 1 refills | Status: DC
  Filled 2023-07-04: qty 30, 30d supply, fill #0
  Filled 2023-08-18: qty 30, 30d supply, fill #1

## 2023-07-31 ENCOUNTER — Encounter: Payer: Self-pay | Admitting: Surgical

## 2023-08-16 NOTE — Progress Notes (Signed)
  [name] [address] [place] [address] Phone: [phone] Subjective:   Priscilla [name], am serving as a scribe for Dr. Antoine Primas.  I'm seeing this patient by the request  of:  [name], MD  CC: Back and neck pain follow-up  [id number]  BRONX XING is a 38 y.o. female coming in with complaint of back and neck pain. OMT 07/19/2023. Patient states that her lower back pain is worse than it typically is. Has been able to continue working out but is limiting her.   Medications patient has been prescribed: Gabapentin  Taking:         Reviewed prior external information including notes and imaging from previsou exam, outside providers and external EMR if available.   As well as notes that were available from care everywhere and other healthcare systems.  Past medical history, social, surgical and family history all reviewed in electronic medical record.  No pertanent information unless stated regarding to the chief complaint.   Past Medical History:  Diagnosis Date   Abnormal Pap smear    Hx of varicella     No Known Allergies   Review of Systems:  No headache, visual changes, nausea, vomiting, diarrhea, constipation, dizziness, abdominal pain, skin rash, fevers, chills, night sweats, weight loss, swollen lymph nodes, body aches, joint swelling, chest pain, shortness of breath, mood changes. POSITIVE muscle aches  Objective  Blood pressure (!) 124/92, height 5\' 2"  (1.575 m).   General: No apparent distress alert and oriented x3 mood and affect normal, dressed appropriately.  HEENT: Pupils equal, extraocular movements intact  Respiratory: Patient's speak in full sentences and does not appear short of breath  Cardiovascular: No lower extremity edema, non tender, no erythema  Gait normal MSK:  Back exam shows some mild loss lordosis.  Patient does have some tenderness over the sacroiliac joint right greater than  left.  Neck exam more right greater than left as well with some minimal sidebending difficulty as well as extension lacking the last 5 degrees.  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  Discussed icing regimen and home exercises, which activities to do atorvastatin 40.  Discussed which activities will be beneficial.  Follow-up again in 6 to 8 weeks. Prednisone 20 mg given as needed today secondary to his severe pain.  Patient was given scanner.  Nonallopathic problems  Decision today to treat with OMT was based on Physical Exam  After verbal consent patient was treated with HVLA, ME, FPR techniques in cervical, rib, thoracic, lumbar, and sacral  areas  Patient tolerated the procedure well with improvement in symptoms  Patient given exercises, stretches and lifestyle modifications  See medications in patient instructions if given  Patient will follow up in 4-8 weeks    The above documentation has been reviewed and is accurate and complete Judi Saa, DO          Note: This dictation was prepared with Dragon dictation along with smaller phrase technology. Any transcriptional errors that result from this process are unintentional.

## 2023-08-17 ENCOUNTER — Other Ambulatory Visit (HOSPITAL_BASED_OUTPATIENT_CLINIC_OR_DEPARTMENT_OTHER): Payer: Self-pay

## 2023-08-17 ENCOUNTER — Ambulatory Visit: Payer: 59 | Admitting: Family Medicine

## 2023-08-17 ENCOUNTER — Encounter: Payer: Self-pay | Admitting: Family Medicine

## 2023-08-17 VITALS — BP 124/92 | Ht 62.0 in

## 2023-08-17 DIAGNOSIS — M9908 Segmental and somatic dysfunction of rib cage: Secondary | ICD-10-CM | POA: Diagnosis not present

## 2023-08-17 DIAGNOSIS — M9902 Segmental and somatic dysfunction of thoracic region: Secondary | ICD-10-CM | POA: Diagnosis not present

## 2023-08-17 DIAGNOSIS — M9904 Segmental and somatic dysfunction of sacral region: Secondary | ICD-10-CM | POA: Diagnosis not present

## 2023-08-17 DIAGNOSIS — M9901 Segmental and somatic dysfunction of cervical region: Secondary | ICD-10-CM

## 2023-08-17 DIAGNOSIS — M533 Sacrococcygeal disorders, not elsewhere classified: Secondary | ICD-10-CM | POA: Diagnosis not present

## 2023-08-17 DIAGNOSIS — M9903 Segmental and somatic dysfunction of lumbar region: Secondary | ICD-10-CM

## 2023-08-17 MED ORDER — PREDNISONE 20 MG PO TABS
20.0000 mg | ORAL_TABLET | Freq: Every day | ORAL | 0 refills | Status: AC
  Filled 2023-08-17: qty 7, 7d supply, fill #0

## 2023-08-17 NOTE — Assessment & Plan Note (Signed)
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  Discussed icing regimen and home exercises, which activities to do atorvastatin 40.  Discussed which activities will be beneficial.  Follow-up again in 6 to 8 weeks.

## 2023-08-17 NOTE — Patient Instructions (Addendum)
Pred 20mg  for 7 days Avoid extension Increase weight by 10% each week See me again in 6-8 weeks

## 2023-08-18 ENCOUNTER — Other Ambulatory Visit: Payer: Self-pay | Admitting: Internal Medicine

## 2023-08-18 ENCOUNTER — Other Ambulatory Visit (HOSPITAL_BASED_OUTPATIENT_CLINIC_OR_DEPARTMENT_OTHER): Payer: Self-pay

## 2023-08-18 DIAGNOSIS — F411 Generalized anxiety disorder: Secondary | ICD-10-CM

## 2023-08-21 ENCOUNTER — Other Ambulatory Visit (HOSPITAL_BASED_OUTPATIENT_CLINIC_OR_DEPARTMENT_OTHER): Payer: Self-pay

## 2023-08-22 ENCOUNTER — Other Ambulatory Visit (HOSPITAL_BASED_OUTPATIENT_CLINIC_OR_DEPARTMENT_OTHER): Payer: Self-pay

## 2023-08-22 ENCOUNTER — Other Ambulatory Visit: Payer: Self-pay | Admitting: Internal Medicine

## 2023-08-22 DIAGNOSIS — F411 Generalized anxiety disorder: Secondary | ICD-10-CM

## 2023-08-23 ENCOUNTER — Other Ambulatory Visit (HOSPITAL_BASED_OUTPATIENT_CLINIC_OR_DEPARTMENT_OTHER): Payer: Self-pay

## 2023-08-23 ENCOUNTER — Encounter (HOSPITAL_BASED_OUTPATIENT_CLINIC_OR_DEPARTMENT_OTHER): Payer: Self-pay | Admitting: Pharmacist

## 2023-08-23 ENCOUNTER — Other Ambulatory Visit: Payer: Self-pay | Admitting: Surgical

## 2023-08-23 MED ORDER — LIDOCAINE 23% - TETRACAINE 7% TOPICAL OINTMENT (PLASTICIZED)
1.0000 | TOPICAL_OINTMENT | Freq: Once | CUTANEOUS | 0 refills | Status: AC
  Filled 2023-08-23: qty 60, fill #0
  Filled 2023-08-24: qty 60, 1d supply, fill #0

## 2023-08-24 ENCOUNTER — Other Ambulatory Visit (HOSPITAL_COMMUNITY): Payer: Self-pay

## 2023-08-24 ENCOUNTER — Other Ambulatory Visit (HOSPITAL_BASED_OUTPATIENT_CLINIC_OR_DEPARTMENT_OTHER): Payer: Self-pay

## 2023-08-29 ENCOUNTER — Other Ambulatory Visit (HOSPITAL_COMMUNITY): Payer: Self-pay

## 2023-09-01 ENCOUNTER — Ambulatory Visit (INDEPENDENT_AMBULATORY_CARE_PROVIDER_SITE_OTHER): Payer: Self-pay | Admitting: Surgical

## 2023-09-01 DIAGNOSIS — Z411 Encounter for cosmetic surgery: Secondary | ICD-10-CM

## 2023-09-01 NOTE — Progress Notes (Signed)
Preoperative Dx: Hyperpigmentation, redness  Postoperative Dx:  same  Procedure: laser to face   Anesthesia: none  Description of Procedure:  Risks and complications were explained to the patient. Consent was confirmed and signed.  Time out was called and all information was confirmed to be correct.  The area was prepped with alcohol and wiped dry.   The BBL 532 nm laser was set at 2 J/cm2 for a base pass The BBL 560 nm laser was set at 13 J/cm2 to target rosacea The BBL 532 nm laser was set at 7 J/cm2 to target facial hyperpigmentation  Priscilla [name] tolerated the procedure well and there were no complications.   We discussed post-laser recommendations

## 2023-09-08 ENCOUNTER — Encounter: Payer: Self-pay | Admitting: Surgical

## 2023-09-08 ENCOUNTER — Ambulatory Visit (INDEPENDENT_AMBULATORY_CARE_PROVIDER_SITE_OTHER): Payer: Self-pay | Admitting: Surgical

## 2023-09-08 DIAGNOSIS — Z411 Encounter for cosmetic surgery: Secondary | ICD-10-CM

## 2023-09-08 NOTE — Progress Notes (Signed)
 Botulinum Toxin Procedure Note  Procedure: Cosmetic botulinum toxin  Pre-operative Diagnosis: Dynamic rhytides  Post-operative Diagnosis: Same  Complications:  None  Brief history: The patient desires botulinum toxin injection.  She is aware of the risks including bleeding, damage to deeper structures, asymmetry, brow ptosis, eyelid ptosis, bruising. The patient understands and wishes to proceed.  Procedure: The area was prepped with alcohol and dried with a clean gauze.  Using a clean technique the botulinum toxin was diluted with 2.5 mL of bacteriostatic saline per 100 unit vial which resulted in 4 units per 0.1 mL.  Subsequently the mixture was injected in the glabellar, lateral canthal lines, forehead area with preservation of the temporal branch to the lateral eyebrow. A total of 30 Units of botulinum toxin was used. The forehead and glabellar area was injected with care to inject intramuscular only while holding pressure on the supratrochlear vessels in each area during each injection on either side of the medial corrugators. The injection proceeded vertically superiorly to the medial 2/3 of the frontalis muscle and superior 2/3 of the lateral frontalis, again with preservation of the frontal branch.  No complications were noted. Light pressure was held for 5 minutes. She was instructed explicitly in post-operative care.  Botox LOT:  [id number] EXP:  2027/02

## 2023-09-22 NOTE — Progress Notes (Signed)
  Priscilla [name] [address] [place] [address] Phone: [phone] Subjective:   Priscilla [name], am serving as a scribe for Dr. Antoine Primas.  I'm seeing this patient by the request  of:  [name], MD  CC: back and neck pain follow up   [id number]  Priscilla [name] is a 38 y.o. female coming in with complaint of back and neck pain. OMT on 08/17/2023. Patient states doing well. Same per usual. No new symptoms.  Medications patient has been prescribed: Prednisone  Taking:       Reviewed prior external information including notes and imaging from previsou exam, outside providers and external EMR if available.   As well as notes that were available from care everywhere and other healthcare systems.  Past medical history, social, surgical and family history all reviewed in electronic medical record.  No pertanent information unless stated regarding to the chief complaint.   Past Medical History:  Diagnosis Date   Abnormal Pap smear    Hx of varicella     No Known Allergies   Review of Systems:  No headache, visual changes, nausea, vomiting, diarrhea, constipation, dizziness, abdominal pain, skin rash, fevers, chills, night sweats, weight loss, swollen lymph nodes, body aches, joint swelling, chest pain, shortness of breath, mood changes. POSITIVE muscle aches  Objective  Blood pressure 110/68, pulse 86, height 5\' 2"  (1.575 m), weight 164 lb (74.4 kg), SpO2 98%.   General: No apparent distress alert and oriented x3 mood and affect normal, dressed appropriately.  HEENT: Pupils equal, extraocular movements intact  Respiratory: Patient's speak in full sentences and does not appear short of breath  Cardiovascular: No lower extremity edema, non tender, no erythema  Gait MSK:  Back very mildly increased tightness of the lower back but nothing severe.  Osteopathic findings  C2 flexed rotated and side bent right C6 flexed rotated and  side bent left T3 extended rotated and side bent right inhaled rib T9 extended rotated and side bent left L2 flexed rotated and side bent right Sacrum right on right     Assessment and Plan:  SI (sacroiliac) joint dysfunction Stable overall, patient did very well on her skiing trip.  Nothing extremely severe at this moment.  Increase activity slowly.  Discussed which activities to do and which ones to avoid.  Follow-up again in 6 to 8 weeks otherwise.    Nonallopathic problems  Decision today to treat with OMT was based on Physical Exam  After verbal consent patient was treated with HVLA, ME, FPR techniques in cervical, rib, thoracic, lumbar, and sacral  areas  Patient tolerated the procedure well with improvement in symptoms  Patient given exercises, stretches and lifestyle modifications  See medications in patient instructions if given  Patient will follow up in 4-8 weeks     The above documentation has been reviewed and is accurate and complete Judi Saa, DO         Note: This dictation was prepared with Dragon dictation along with smaller phrase technology. Any transcriptional errors that result from this process are unintentional.

## 2023-09-25 ENCOUNTER — Encounter: Payer: Self-pay | Admitting: Family Medicine

## 2023-09-25 ENCOUNTER — Ambulatory Visit (INDEPENDENT_AMBULATORY_CARE_PROVIDER_SITE_OTHER): Payer: 59 | Admitting: Family Medicine

## 2023-09-25 VITALS — BP 110/68 | HR 86 | Ht 62.0 in | Wt 164.0 lb

## 2023-09-25 DIAGNOSIS — M9903 Segmental and somatic dysfunction of lumbar region: Secondary | ICD-10-CM | POA: Diagnosis not present

## 2023-09-25 DIAGNOSIS — M533 Sacrococcygeal disorders, not elsewhere classified: Secondary | ICD-10-CM | POA: Diagnosis not present

## 2023-09-25 DIAGNOSIS — M9904 Segmental and somatic dysfunction of sacral region: Secondary | ICD-10-CM

## 2023-09-25 DIAGNOSIS — M9901 Segmental and somatic dysfunction of cervical region: Secondary | ICD-10-CM

## 2023-09-25 DIAGNOSIS — M9908 Segmental and somatic dysfunction of rib cage: Secondary | ICD-10-CM | POA: Diagnosis not present

## 2023-09-25 DIAGNOSIS — M9902 Segmental and somatic dysfunction of thoracic region: Secondary | ICD-10-CM

## 2023-09-25 NOTE — Assessment & Plan Note (Signed)
 Stable overall, patient did very well on her skiing trip.  Nothing extremely severe at this moment.  Increase activity slowly.  Discussed which activities to do and which ones to avoid.  Follow-up again in 6 to 8 weeks otherwise.

## 2023-09-25 NOTE — Patient Instructions (Signed)
 Good to see you. Enjoy [place]. See you in 6 weeks.

## 2023-09-29 ENCOUNTER — Other Ambulatory Visit: Payer: Self-pay | Admitting: Family Medicine

## 2023-09-29 ENCOUNTER — Other Ambulatory Visit (HOSPITAL_BASED_OUTPATIENT_CLINIC_OR_DEPARTMENT_OTHER): Payer: Self-pay

## 2023-09-29 MED ORDER — GABAPENTIN 300 MG PO CAPS
300.0000 mg | ORAL_CAPSULE | Freq: Every day | ORAL | 1 refills | Status: DC
  Filled 2023-09-29: qty 30, 30d supply, fill #0
  Filled 2023-11-01: qty 30, 30d supply, fill #1

## 2023-10-09 ENCOUNTER — Encounter: Payer: Self-pay | Admitting: Family Medicine

## 2023-10-09 ENCOUNTER — Ambulatory Visit: Payer: 59 | Admitting: Family Medicine

## 2023-10-09 VITALS — BP 124/76 | HR 50 | Resp 20 | Ht 62.0 in | Wt 162.0 lb

## 2023-10-09 DIAGNOSIS — L918 Other hypertrophic disorders of the skin: Secondary | ICD-10-CM

## 2023-10-09 NOTE — Patient Instructions (Signed)
Do not shower for the rest of the day. When you do wash it, use only soap and water. Do not vigorously scrub. Apply triple antibiotic ointment (like Neosporin) twice daily. Keep the area clean and dry.   Things to look out for: increasing pain not relieved by ibuprofen/acetaminophen, fevers, spreading redness, drainage of pus, or foul odor.  Let us know if you need anything. 

## 2023-10-09 NOTE — Progress Notes (Signed)
 Chief Complaint  Patient presents with   Acute Visit    Patient presents today for skin tag removal    Priscilla [name] is a 38 y.o. female here for a skin complaint.  Duration: 3 weeks Location: L side/chest wall Pruritic? No Painful? Yes Drainage? No Got caught on clothing.  Other associated symptoms:  Therapies tried thus far: none  Past Medical History:  Diagnosis Date   Abnormal Pap smear    Hx of varicella     BP 124/76   Pulse (!) 50   Resp 20   Ht 5\' 2"  (1.575 m)   Wt 162 lb (73.5 kg)   SpO2 97%   BMI 29.63 kg/m  Gen: awake, alert, appearing stated age Lungs: No accessory muscle use Skin: slightly hyperpigmented fleshy pedunculation along the L lateral chest wall over R 5 just posterior to the ant ax line. No drainage, erythema, TTP, fluctuance, excoriation Psych: Age appropriate judgment and insight  Procedure note; skin tag removal Informed consent obtained. The skin tag was cleaned w alcohol.  It was anesthetized with 1% lidocaine with epinephrine. There were then removed with a blade.  Hemostasis was obtained via a hyfrecator. TAO and a bandage placed.  There were no complications noted. The patient tolerated the procedure well.  Acrochordon - Plan: PR DESTRUCTION BENIGN LESIONS UP TO 14  Aftercare instructions verbalized written down after removal. She is aware of a potential cost with this. No other lesions she is concerned about.  F/u prn. The patient voiced understanding and agreement to the plan.  Jilda Roche [place], DO 10/09/23 4:38 PM

## 2023-10-30 DIAGNOSIS — D2261 Melanocytic nevi of right upper limb, including shoulder: Secondary | ICD-10-CM | POA: Diagnosis not present

## 2023-10-30 DIAGNOSIS — L814 Other melanin hyperpigmentation: Secondary | ICD-10-CM | POA: Diagnosis not present

## 2023-10-30 DIAGNOSIS — L821 Other seborrheic keratosis: Secondary | ICD-10-CM | POA: Diagnosis not present

## 2023-10-30 DIAGNOSIS — D2272 Melanocytic nevi of left lower limb, including hip: Secondary | ICD-10-CM | POA: Diagnosis not present

## 2023-10-30 DIAGNOSIS — D225 Melanocytic nevi of trunk: Secondary | ICD-10-CM | POA: Diagnosis not present

## 2023-10-30 DIAGNOSIS — D1801 Hemangioma of skin and subcutaneous tissue: Secondary | ICD-10-CM | POA: Diagnosis not present

## 2023-10-30 DIAGNOSIS — D2239 Melanocytic nevi of other parts of face: Secondary | ICD-10-CM | POA: Diagnosis not present

## 2023-10-30 DIAGNOSIS — D2271 Melanocytic nevi of right lower limb, including hip: Secondary | ICD-10-CM | POA: Diagnosis not present

## 2023-10-30 DIAGNOSIS — D224 Melanocytic nevi of scalp and neck: Secondary | ICD-10-CM | POA: Diagnosis not present

## 2023-10-30 NOTE — Progress Notes (Deleted)
  [name] [address] [place] [address] Phone: [phone] Subjective:    I'm seeing this patient by the request  of:  [name], MD  CC:   [id number]  Priscilla [name] is a 38 y.o. female coming in with complaint of back and neck pain. OMT on 09/25/2023. Patient states   Medications patient has been prescribed: Gabapentin  Taking:         Reviewed prior external information including notes and imaging from previsou exam, outside providers and external EMR if available.   As well as notes that were available from care everywhere and other healthcare systems.  Past medical history, social, surgical and family history all reviewed in electronic medical record.  No pertanent information unless stated regarding to the chief complaint.   Past Medical History:  Diagnosis Date   Abnormal Pap smear    Hx of varicella     No Known Allergies   Review of Systems:  No headache, visual changes, nausea, vomiting, diarrhea, constipation, dizziness, abdominal pain, skin rash, fevers, chills, night sweats, weight loss, swollen lymph nodes, body aches, joint swelling, chest pain, shortness of breath, mood changes. POSITIVE muscle aches  Objective  There were no vitals taken for this visit.   General: No apparent distress alert and oriented x3 mood and affect normal, dressed appropriately.  HEENT: Pupils equal, extraocular movements intact  Respiratory: Patient's speak in full sentences and does not appear short of breath  Cardiovascular: No lower extremity edema, non tender, no erythema  Gait MSK:  Back   Osteopathic findings  C2 flexed rotated and side bent right C6 flexed rotated and side bent left T3 extended rotated and side bent right inhaled rib T9 extended rotated and side bent left L2 flexed rotated and side bent right Sacrum right on right       Assessment and Plan:  No problem-specific Assessment & Plan notes  found for this encounter.    Nonallopathic problems  Decision today to treat with OMT was based on Physical Exam  After verbal consent patient was treated with HVLA, ME, FPR techniques in cervical, rib, thoracic, lumbar, and sacral  areas  Patient tolerated the procedure well with improvement in symptoms  Patient given exercises, stretches and lifestyle modifications  See medications in patient instructions if given  Patient will follow up in 4-8 weeks             Note: This dictation was prepared with Dragon dictation along with smaller phrase technology. Any transcriptional errors that result from this process are unintentional.

## 2023-11-06 ENCOUNTER — Ambulatory Visit: Payer: 59 | Admitting: Family Medicine

## 2023-11-14 NOTE — Progress Notes (Signed)
  [facility] [address] [place] [address] Phone: [phone] Subjective:   Priscilla [name], am serving as a scribe for Dr. Ronnell Coins.  I'm seeing this patient by the request  of:  Arcadio Knuckles, MD  CC: Back and neck pain follow-up  [id number]  Priscilla [name] is a 38 y.o. female coming in with complaint of back and neck pain. OMT on 09/25/2023. Patient states that she is the same as last visit.   Medications patient has been prescribed: Gabapentin   Taking: Yes         Reviewed prior external information including notes and imaging from previsou exam, outside providers and external EMR if available.   As well as notes that were available from care everywhere and other healthcare systems.  Past medical history, social, surgical and family history all reviewed in electronic medical record.  No pertanent information unless stated regarding to the chief complaint.   Past Medical History:  Diagnosis Date   Abnormal Pap smear    Hx of varicella     No Known Allergies   Review of Systems:  No headache, visual changes, nausea, vomiting, diarrhea, constipation, dizziness, abdominal pain, skin rash, fevers, chills, night sweats, weight loss, swollen lymph nodes, body aches, joint swelling, chest pain, shortness of breath, mood changes. POSITIVE muscle aches  Objective  There were no vitals taken for this visit.   General: No apparent distress alert and oriented x3 mood and affect normal, dressed appropriately.  HEENT: Pupils equal, extraocular movements intact  Respiratory: Patient's speak in full sentences and does not appear short of breath  Cardiovascular: No lower extremity edema, non tender, no erythema  Gait MSK:  Back does have loss of lordosis   Osteopathic findings  C2 flexed rotated and side bent right C7 flexed rotated and side bent left T3 extended rotated and side bent right inhaled rib T7 extended rotated and  side bent left L3 flexed rotated and side bent right L4 flexed rotated and side bent left Sacrum right on right       Assessment and Plan:  No problem-specific Assessment & Plan notes found for this encounter.    Nonallopathic problems  Decision today to treat with OMT was based on Physical Exam  After verbal consent patient was treated with HVLA, ME, FPR techniques in cervical, rib, thoracic, lumbar, and sacral  areas  Patient tolerated the procedure well with improvement in symptoms  Patient given exercises, stretches and lifestyle modifications  See medications in patient instructions if given  Patient will follow up in 4-8 weeks    The above documentation has been reviewed and is accurate and complete Aliya Sol M Kinsey Cowsert, DO          Note: This dictation was prepared with Dragon dictation along with smaller phrase technology. Any transcriptional errors that result from this process are unintentional.

## 2023-11-20 ENCOUNTER — Encounter: Payer: Self-pay | Admitting: Family Medicine

## 2023-11-20 ENCOUNTER — Ambulatory Visit (INDEPENDENT_AMBULATORY_CARE_PROVIDER_SITE_OTHER): Admitting: Family Medicine

## 2023-11-20 VITALS — BP 112/72 | HR 94 | Ht 62.0 in

## 2023-11-20 DIAGNOSIS — M533 Sacrococcygeal disorders, not elsewhere classified: Secondary | ICD-10-CM

## 2023-11-20 DIAGNOSIS — M9902 Segmental and somatic dysfunction of thoracic region: Secondary | ICD-10-CM

## 2023-11-20 DIAGNOSIS — M9903 Segmental and somatic dysfunction of lumbar region: Secondary | ICD-10-CM | POA: Diagnosis not present

## 2023-11-20 DIAGNOSIS — M9901 Segmental and somatic dysfunction of cervical region: Secondary | ICD-10-CM | POA: Diagnosis not present

## 2023-11-20 DIAGNOSIS — M9904 Segmental and somatic dysfunction of sacral region: Secondary | ICD-10-CM | POA: Diagnosis not present

## 2023-11-20 DIAGNOSIS — M9908 Segmental and somatic dysfunction of rib cage: Secondary | ICD-10-CM

## 2023-11-20 NOTE — Patient Instructions (Signed)
 Good luck with hunting dog See me in 2 months

## 2023-11-20 NOTE — Assessment & Plan Note (Signed)
 Chronic problem with mild exacerbation secondary to the walking on sand that I do think caused a little exacerbation.  Discussed icing regimen and home exercises, discussed which activities to do and which ones to avoid.  Increase activity slowly otherwise.  Follow-up again in 6 to 8 weeks.

## 2023-12-07 ENCOUNTER — Other Ambulatory Visit: Payer: Self-pay | Admitting: Family Medicine

## 2023-12-07 ENCOUNTER — Other Ambulatory Visit (HOSPITAL_BASED_OUTPATIENT_CLINIC_OR_DEPARTMENT_OTHER): Payer: Self-pay

## 2023-12-07 MED ORDER — GABAPENTIN 300 MG PO CAPS
300.0000 mg | ORAL_CAPSULE | Freq: Every day | ORAL | 1 refills | Status: DC
  Filled 2023-12-07: qty 30, 30d supply, fill #0
  Filled 2024-01-16: qty 30, 30d supply, fill #1

## 2023-12-20 ENCOUNTER — Ambulatory Visit (INDEPENDENT_AMBULATORY_CARE_PROVIDER_SITE_OTHER): Payer: Self-pay | Admitting: Surgical

## 2023-12-20 DIAGNOSIS — Z411 Encounter for cosmetic surgery: Secondary | ICD-10-CM

## 2023-12-20 NOTE — Progress Notes (Signed)
 Botulinum Toxin Procedure Note  Procedure: Cosmetic botulinum toxin  Pre-operative Diagnosis: Dynamic rhytides  Post-operative Diagnosis: Same  Complications:  None  Brief history: The patient desires botulinum toxin injection.  She is aware of the risks including bleeding, damage to deeper structures, asymmetry, brow ptosis, eyelid ptosis, bruising. The patient understands and wishes to proceed.  Procedure: The area was prepped with alcohol and dried with a clean gauze.  Using a clean technique the botulinum toxin was diluted with 2.5 mL of bacteriostatic saline per 100 unit vial which resulted in 4 units per 0.1 mL.  Subsequently the mixture was injected in the glabellar, lateral canthal lines, forehead area with preservation of the temporal branch to the lateral eyebrow. A total of 30 Units of botulinum toxin was used. The forehead and glabellar area was injected with care to inject intramuscular only while holding pressure on the supratrochlear vessels in each area during each injection on either side of the medial corrugators. The injection proceeded vertically superiorly to the medial 2/3 of the frontalis muscle and superior 2/3 of the lateral frontalis, again with preservation of the frontal branch.  No complications were noted. Light pressure was held for 5 minutes. She was instructed explicitly in post-operative care.  Botox LOT: [id number] EXP:  09/2025

## 2024-01-17 NOTE — Progress Notes (Signed)
  [facility] [address] [place] [address] Phone: [phone] Subjective:   Priscilla [name], am serving as a scribe for Dr. Arthea Claudene.  I'm seeing this patient by the request  of:  Joshua Debby CROME, MD  CC: Back and neck pain follow-up  YEP:Dlagzrupcz  Priscilla [name] is a 38 y.o. female coming in with complaint of back and neck pain. OMT on 11/20/2023. Patient states doing well. No new symptoms. About the same as last appointment.  Medications patient has been prescribed: Gabapentin   Taking: yes          Reviewed prior external information including notes and imaging from previsou exam, outside providers and external EMR if available.   As well as notes that were available from care everywhere and other healthcare systems.  Past medical history, social, surgical and family history all reviewed in electronic medical record.  No pertanent information unless stated regarding to the chief complaint.   Past Medical History:  Diagnosis Date   Abnormal Pap smear    Hx of varicella       Review of Systems:  No headache, visual changes, nausea, vomiting, diarrhea, constipation, dizziness, abdominal pain, skin rash, fevers, chills, night sweats, weight loss, swollen lymph nodes, body aches, joint swelling, chest pain, shortness of breath, mood changes. POSITIVE muscle aches  Objective  Blood pressure 110/76, pulse 94, height 5' 2 (1.575 m), weight 151 lb (68.5 kg), SpO2 98%.   General: No apparent distress alert and oriented x3 mood and affect normal, dressed appropriately.  HEENT: Pupils equal, extraocular movements intact  Respiratory: Patient's speak in full sentences and does not appear short of breath  Cardiovascular: No lower extremity edema, non tender, no erythema  Gait MSK:  Back tightness in the thoracolumbar juncture.  Tightness in the right sacroiliac joint.  Patient does have tightness noted more on the right side of the  neck as well.  Female that is severe enough to stop her from activity at this moment.  Osteopathic findings  C2 flexed rotated and side bent right C6 flexed rotated and side bent left T3 extended rotated and side bent right inhaled rib T9 extended rotated and side bent left L2 flexed rotated and side bent right L4 flexed rotated and side bent left Sacrum right on right     Assessment and Plan:  Cervical radiculopathy at C8 Discussed icing regimen of home exercises, which activities to do and which ones to avoid.  Increase activity slowly.  Discussed avoiding certain activities.  Follow-up with me again in 6 to 8 weeks.    Nonallopathic problems  Decision today to treat with OMT was based on Physical Exam  After verbal consent patient was treated with HVLA, ME, FPR techniques in cervical, rib, thoracic, lumbar, and sacral  areas  Patient tolerated the procedure well with improvement in symptoms  Patient given exercises, stretches and lifestyle modifications  See medications in patient instructions if given  Patient will follow up in 4-8 weeks     The above documentation has been reviewed and is accurate and complete Priscilla Leeman M Westlynn Fifer, DO         Note: This dictation was prepared with Dragon dictation along with smaller phrase technology. Any transcriptional errors that result from this process are unintentional.

## 2024-01-18 ENCOUNTER — Encounter: Payer: Self-pay | Admitting: Family Medicine

## 2024-01-18 ENCOUNTER — Ambulatory Visit (INDEPENDENT_AMBULATORY_CARE_PROVIDER_SITE_OTHER): Admitting: Family Medicine

## 2024-01-18 VITALS — BP 110/76 | HR 94 | Ht 62.0 in | Wt 151.0 lb

## 2024-01-18 DIAGNOSIS — M9908 Segmental and somatic dysfunction of rib cage: Secondary | ICD-10-CM | POA: Diagnosis not present

## 2024-01-18 DIAGNOSIS — M9904 Segmental and somatic dysfunction of sacral region: Secondary | ICD-10-CM

## 2024-01-18 DIAGNOSIS — M5412 Radiculopathy, cervical region: Secondary | ICD-10-CM

## 2024-01-18 DIAGNOSIS — M9903 Segmental and somatic dysfunction of lumbar region: Secondary | ICD-10-CM | POA: Diagnosis not present

## 2024-01-18 DIAGNOSIS — M9902 Segmental and somatic dysfunction of thoracic region: Secondary | ICD-10-CM

## 2024-01-18 DIAGNOSIS — M9901 Segmental and somatic dysfunction of cervical region: Secondary | ICD-10-CM

## 2024-01-18 NOTE — Patient Instructions (Signed)
 Have fun playing [place] gatsby See you again in 2 months

## 2024-01-18 NOTE — Assessment & Plan Note (Signed)
 Discussed icing regimen of home exercises, which activities to do and which ones to avoid.  Increase activity slowly.  Discussed avoiding certain activities.  Follow-up with me again in 6 to 8 weeks.

## 2024-02-27 ENCOUNTER — Other Ambulatory Visit: Payer: Self-pay | Admitting: Family Medicine

## 2024-02-27 ENCOUNTER — Other Ambulatory Visit (HOSPITAL_BASED_OUTPATIENT_CLINIC_OR_DEPARTMENT_OTHER): Payer: Self-pay

## 2024-02-27 MED ORDER — GABAPENTIN 300 MG PO CAPS
300.0000 mg | ORAL_CAPSULE | Freq: Every day | ORAL | 1 refills | Status: DC
  Filled 2024-02-27: qty 30, 30d supply, fill #0
  Filled 2024-03-28: qty 30, 30d supply, fill #1

## 2024-03-12 NOTE — Progress Notes (Deleted)
  [facility] [address] [place] [address] Phone: [phone] Subjective:    I'm seeing this patient by the request  of:  Joshua Debby CROME, MD  CC:   YEP:Dlagzrupcz  Priscilla [name] is a 38 y.o. female coming in with complaint of back and neck pain. OMT on 01/18/2024. Patient states   Medications patient has been prescribed: Gabapentin   Taking:         Reviewed prior external information including notes and imaging from previsou exam, outside providers and external EMR if available.   As well as notes that were available from care everywhere and other healthcare systems.  Past medical history, social, surgical and family history all reviewed in electronic medical record.  No pertanent information unless stated regarding to the chief complaint.   Past Medical History:  Diagnosis Date   Abnormal Pap smear    Hx of varicella     No Known Allergies   Review of Systems:  No headache, visual changes, nausea, vomiting, diarrhea, constipation, dizziness, abdominal pain, skin rash, fevers, chills, night sweats, weight loss, swollen lymph nodes, body aches, joint swelling, chest pain, shortness of breath, mood changes. POSITIVE muscle aches  Objective  There were no vitals taken for this visit.   General: No apparent distress alert and oriented x3 mood and affect normal, dressed appropriately.  HEENT: Pupils equal, extraocular movements intact  Respiratory: Patient's speak in full sentences and does not appear short of breath  Cardiovascular: No lower extremity edema, non tender, no erythema  Gait MSK:  Back   Osteopathic findings  C2 flexed rotated and side bent right C6 flexed rotated and side bent left T3 extended rotated and side bent right inhaled rib T9 extended rotated and side bent left L2 flexed rotated and side bent right Sacrum right on right       Assessment and Plan:  No problem-specific Assessment & Plan notes  found for this encounter.    Nonallopathic problems  Decision today to treat with OMT was based on Physical Exam  After verbal consent patient was treated with HVLA, ME, FPR techniques in cervical, rib, thoracic, lumbar, and sacral  areas  Patient tolerated the procedure well with improvement in symptoms  Patient given exercises, stretches and lifestyle modifications  See medications in patient instructions if given  Patient will follow up in 4-8 weeks             Note: This dictation was prepared with Dragon dictation along with smaller phrase technology. Any transcriptional errors that result from this process are unintentional.

## 2024-03-20 ENCOUNTER — Ambulatory Visit: Admitting: Family Medicine

## 2024-05-09 ENCOUNTER — Other Ambulatory Visit: Payer: Self-pay | Admitting: Family Medicine

## 2024-05-12 ENCOUNTER — Other Ambulatory Visit (HOSPITAL_BASED_OUTPATIENT_CLINIC_OR_DEPARTMENT_OTHER): Payer: Self-pay

## 2024-05-12 MED ORDER — GABAPENTIN 300 MG PO CAPS
300.0000 mg | ORAL_CAPSULE | Freq: Every day | ORAL | 1 refills | Status: DC
  Filled 2024-05-12: qty 30, 30d supply, fill #0
  Filled 2024-06-11: qty 30, 30d supply, fill #1

## 2024-05-13 ENCOUNTER — Other Ambulatory Visit (HOSPITAL_BASED_OUTPATIENT_CLINIC_OR_DEPARTMENT_OTHER): Payer: Self-pay

## 2024-05-14 ENCOUNTER — Other Ambulatory Visit (HOSPITAL_BASED_OUTPATIENT_CLINIC_OR_DEPARTMENT_OTHER): Payer: Self-pay

## 2024-05-14 MED ORDER — FLUZONE 0.5 ML IM SUSY
0.5000 mL | PREFILLED_SYRINGE | Freq: Once | INTRAMUSCULAR | 0 refills | Status: AC
  Filled 2024-05-14: qty 0.5, 1d supply, fill #0

## 2024-07-01 DIAGNOSIS — Z6825 Body mass index (BMI) 25.0-25.9, adult: Secondary | ICD-10-CM | POA: Diagnosis not present

## 2024-07-01 DIAGNOSIS — Z124 Encounter for screening for malignant neoplasm of cervix: Secondary | ICD-10-CM | POA: Diagnosis not present

## 2024-07-01 DIAGNOSIS — Z01419 Encounter for gynecological examination (general) (routine) without abnormal findings: Secondary | ICD-10-CM | POA: Diagnosis not present

## 2024-07-06 ENCOUNTER — Other Ambulatory Visit: Payer: Self-pay | Admitting: Family Medicine

## 2024-07-06 MED ORDER — GABAPENTIN 300 MG PO CAPS
300.0000 mg | ORAL_CAPSULE | Freq: Every day | ORAL | 1 refills | Status: AC
  Filled 2024-07-06 – 2024-07-10 (×2): qty 30, 30d supply, fill #0
  Filled 2024-08-07: qty 30, 30d supply, fill #1

## 2024-07-08 ENCOUNTER — Other Ambulatory Visit (HOSPITAL_BASED_OUTPATIENT_CLINIC_OR_DEPARTMENT_OTHER): Payer: Self-pay

## 2024-07-10 ENCOUNTER — Other Ambulatory Visit (HOSPITAL_BASED_OUTPATIENT_CLINIC_OR_DEPARTMENT_OTHER): Payer: Self-pay

## 2024-07-30 NOTE — Progress Notes (Signed)
" °  [facility] [address] [place] [address] Phone: [phone] Subjective:   Priscilla [name], am serving as a scribe for Dr. Arthea Claudene.  I'm seeing this patient by the request  of:  Joshua Debby CROME, MD  CC: Low back pain, neck pain follow-up  YEP:Dlagzrupcz  Priscilla [name] is a 38 y.o. female coming in with complaint of back and neck pain. OMT on 01/18/2024. Patient states that her lower back is tight. Overall all doing ok.   Medications patient has been prescribed: gabapentin   Taking:         Reviewed prior external information including notes and imaging from previsou exam, outside providers and external EMR if available.   As well as notes that were available from care everywhere and other healthcare systems.  Past medical history, social, surgical and family history all reviewed in electronic medical record.  No pertanent information unless stated regarding to the chief complaint.   Past Medical History:  Diagnosis Date   Abnormal Pap smear    Hx of varicella     Allergies[1]   Review of Systems:  No headache, visual changes, nausea, vomiting, diarrhea, constipation, dizziness, abdominal pain, skin rash, fevers, chills, night sweats, weight loss, swollen lymph nodes, body aches, joint swelling, chest pain, shortness of breath, mood changes. POSITIVE muscle aches  Objective  Blood pressure 124/86, height 5' 2 (1.575 m).   General: No apparent distress alert and oriented x3 mood and affect normal, dressed appropriately.  HEENT: Pupils equal, extraocular movements intact  Respiratory: Patient's speak in full sentences and does not appear short of breath  Cardiovascular: No lower extremity edema, non tender, no erythema  Gait MSK:  Back does have some loss of lordosis noted.  Some tenderness to palpation in the paraspinal musculature.  Neck exam does have some limited sidebending.  Likely no radicular symptoms with Spurling's  today.  Osteopathic findings  C2 flexed rotated and side bent right C7 flexed rotated and side bent left T3 extended rotated and side bent right inhaled rib T7 extended rotated and side bent left L2 flexed rotated and side bent right Sacrum right on right     Assessment and Plan:  Cervical radiculopathy at C8 Discussed HEP  Discussed which activities Discussed which activities HEP, RTC in 8 weeks  SI (sacroiliac) joint dysfunction Contnue to work on core, do not hold breath     Nonallopathic problems  Decision today to treat with OMT was based on Physical Exam  After verbal consent patient was treated with HVLA, ME, FPR techniques in cervical, rib, thoracic, lumbar, and sacral  areas  Patient tolerated the procedure well with improvement in symptoms  Patient given exercises, stretches and lifestyle modifications  See medications in patient instructions if given  Patient will follow up in 4-8 weeks     The above documentation has been reviewed and is accurate and complete Errika Narvaiz M Aide Wojnar, DO         Note: This dictation was prepared with Dragon dictation along with smaller phrase technology. Any transcriptional errors that result from this process are unintentional.            [1] No Known Allergies  "

## 2024-08-06 ENCOUNTER — Ambulatory Visit: Admitting: Family Medicine

## 2024-08-06 ENCOUNTER — Encounter: Payer: Self-pay | Admitting: Family Medicine

## 2024-08-06 VITALS — BP 124/86 | Ht 62.0 in

## 2024-08-06 DIAGNOSIS — M9908 Segmental and somatic dysfunction of rib cage: Secondary | ICD-10-CM

## 2024-08-06 DIAGNOSIS — M9901 Segmental and somatic dysfunction of cervical region: Secondary | ICD-10-CM

## 2024-08-06 DIAGNOSIS — M533 Sacrococcygeal disorders, not elsewhere classified: Secondary | ICD-10-CM | POA: Diagnosis not present

## 2024-08-06 DIAGNOSIS — M5412 Radiculopathy, cervical region: Secondary | ICD-10-CM

## 2024-08-06 DIAGNOSIS — M9902 Segmental and somatic dysfunction of thoracic region: Secondary | ICD-10-CM

## 2024-08-06 DIAGNOSIS — M9903 Segmental and somatic dysfunction of lumbar region: Secondary | ICD-10-CM | POA: Diagnosis not present

## 2024-08-06 DIAGNOSIS — M9904 Segmental and somatic dysfunction of sacral region: Secondary | ICD-10-CM | POA: Diagnosis not present

## 2024-08-06 NOTE — Assessment & Plan Note (Signed)
 Discussed HEP  Discussed which activities Discussed which activities HEP, RTC in 8 weeks

## 2024-08-06 NOTE — Assessment & Plan Note (Signed)
 Contnue to work on core, do not hold breath

## 2024-08-06 NOTE — Patient Instructions (Signed)
 See me in 2 months

## 2024-08-07 ENCOUNTER — Other Ambulatory Visit (HOSPITAL_BASED_OUTPATIENT_CLINIC_OR_DEPARTMENT_OTHER): Payer: Self-pay

## 2024-08-09 ENCOUNTER — Ambulatory Visit: Admitting: Family Medicine

## 2024-10-07 ENCOUNTER — Ambulatory Visit: Admitting: Family Medicine
